# Patient Record
Sex: Female | Born: 1950
Health system: Southern US, Community
[De-identification: ages and names within clinical notes are randomized; demographics above are authoritative.]

## PROBLEM LIST (undated history)

## (undated) DIAGNOSIS — K5900 Constipation, unspecified: Secondary | ICD-10-CM

## (undated) DIAGNOSIS — T4145XA Adverse effect of unspecified anesthetic, initial encounter: Secondary | ICD-10-CM

## (undated) DIAGNOSIS — D649 Anemia, unspecified: Secondary | ICD-10-CM

## (undated) DIAGNOSIS — I201 Angina pectoris with documented spasm: Secondary | ICD-10-CM

## (undated) DIAGNOSIS — R51 Headache: Secondary | ICD-10-CM

## (undated) DIAGNOSIS — M1990 Unspecified osteoarthritis, unspecified site: Secondary | ICD-10-CM

## (undated) DIAGNOSIS — H9190 Unspecified hearing loss, unspecified ear: Secondary | ICD-10-CM

## (undated) DIAGNOSIS — T7840XA Allergy, unspecified, initial encounter: Secondary | ICD-10-CM

## (undated) DIAGNOSIS — K219 Gastro-esophageal reflux disease without esophagitis: Secondary | ICD-10-CM

## (undated) DIAGNOSIS — Z5189 Encounter for other specified aftercare: Secondary | ICD-10-CM

## (undated) DIAGNOSIS — I10 Essential (primary) hypertension: Secondary | ICD-10-CM

## (undated) DIAGNOSIS — T8859XA Other complications of anesthesia, initial encounter: Secondary | ICD-10-CM

## (undated) DIAGNOSIS — M8580 Other specified disorders of bone density and structure, unspecified site: Secondary | ICD-10-CM

## (undated) DIAGNOSIS — IMO0001 Reserved for inherently not codable concepts without codable children: Secondary | ICD-10-CM

## (undated) DIAGNOSIS — Z889 Allergy status to unspecified drugs, medicaments and biological substances status: Secondary | ICD-10-CM

## (undated) DIAGNOSIS — H269 Unspecified cataract: Secondary | ICD-10-CM

## (undated) DIAGNOSIS — H509 Unspecified strabismus: Secondary | ICD-10-CM

## (undated) HISTORY — DX: Encounter for other specified aftercare: Z51.89

## (undated) HISTORY — PX: COLONOSCOPY: SHX174

## (undated) HISTORY — PX: STOMACH SURGERY: SHX791

## (undated) HISTORY — DX: Constipation, unspecified: K59.00

## (undated) HISTORY — DX: Unspecified strabismus: H50.9

## (undated) HISTORY — DX: Allergy, unspecified, initial encounter: T78.40XA

## (undated) HISTORY — DX: Gastro-esophageal reflux disease without esophagitis: K21.9

## (undated) HISTORY — PX: APPENDECTOMY: SHX54

## (undated) HISTORY — PX: UPPER GASTROINTESTINAL ENDOSCOPY: SHX188

## (undated) HISTORY — PX: ABDOMINAL HYSTERECTOMY: SHX81

## (undated) HISTORY — DX: Essential (primary) hypertension: I10

## (undated) HISTORY — PX: OTHER SURGICAL HISTORY: SHX169

## (undated) HISTORY — PX: TONSILLECTOMY: SUR1361

## (undated) HISTORY — DX: Anemia, unspecified: D64.9

## (undated) HISTORY — DX: Unspecified cataract: H26.9

## (undated) HISTORY — PX: POLYPECTOMY: SHX149

---
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
## 1988-12-04 DIAGNOSIS — I201 Angina pectoris with documented spasm: Secondary | ICD-10-CM

## 1988-12-04 HISTORY — DX: Angina pectoris with documented spasm: I20.1

## 1992-04-05 HISTORY — PX: REPAIR TENDONS FOOT: SUR1209

## 1998-10-06 ENCOUNTER — Emergency Department (HOSPITAL_COMMUNITY): Admission: EM | Admit: 1998-10-06 | Discharge: 1998-10-06 | Payer: Self-pay | Admitting: Internal Medicine

## 1999-09-23 ENCOUNTER — Other Ambulatory Visit: Admission: RE | Admit: 1999-09-23 | Discharge: 1999-09-23 | Payer: Self-pay | Admitting: Obstetrics & Gynecology

## 2000-04-13 ENCOUNTER — Encounter: Payer: Self-pay | Admitting: Emergency Medicine

## 2000-04-14 ENCOUNTER — Encounter: Payer: Self-pay | Admitting: Emergency Medicine

## 2000-04-14 ENCOUNTER — Inpatient Hospital Stay (HOSPITAL_COMMUNITY): Admission: EM | Admit: 2000-04-14 | Discharge: 2000-04-16 | Payer: Self-pay | Admitting: Emergency Medicine

## 2000-04-14 ENCOUNTER — Encounter: Payer: Self-pay | Admitting: Internal Medicine

## 2000-04-15 ENCOUNTER — Encounter: Payer: Self-pay | Admitting: Internal Medicine

## 2000-04-29 ENCOUNTER — Ambulatory Visit (HOSPITAL_COMMUNITY): Admission: RE | Admit: 2000-04-29 | Discharge: 2000-04-30 | Payer: Self-pay | Admitting: Cardiology

## 2001-10-30 ENCOUNTER — Encounter: Payer: Self-pay | Admitting: Orthopedic Surgery

## 2001-10-30 ENCOUNTER — Ambulatory Visit (HOSPITAL_COMMUNITY): Admission: RE | Admit: 2001-10-30 | Discharge: 2001-10-30 | Payer: Self-pay | Admitting: Orthopedic Surgery

## 2004-05-06 ENCOUNTER — Other Ambulatory Visit: Admission: RE | Admit: 2004-05-06 | Discharge: 2004-05-06 | Payer: Self-pay | Admitting: Obstetrics & Gynecology

## 2004-07-07 ENCOUNTER — Ambulatory Visit: Payer: Self-pay | Admitting: Internal Medicine

## 2004-08-11 ENCOUNTER — Ambulatory Visit: Payer: Self-pay | Admitting: Internal Medicine

## 2004-08-11 LAB — HM COLONOSCOPY: HM Colonoscopy: 10

## 2004-10-01 ENCOUNTER — Ambulatory Visit: Payer: Self-pay | Admitting: Internal Medicine

## 2007-02-21 ENCOUNTER — Ambulatory Visit: Payer: Self-pay | Admitting: Cardiology

## 2007-03-08 ENCOUNTER — Ambulatory Visit: Payer: Self-pay | Admitting: Cardiology

## 2007-03-08 LAB — CONVERTED CEMR LAB
ALT: 24 units/L (ref 0–35)
Albumin: 4 g/dL (ref 3.5–5.2)
Alkaline Phosphatase: 80 units/L (ref 39–117)
BUN: 10 mg/dL (ref 6–23)
Basophils Relative: 0 % (ref 0.0–1.0)
Calcium: 9.2 mg/dL (ref 8.4–10.5)
Cholesterol: 230 mg/dL (ref 0–200)
Eosinophils Absolute: 0.1 10*3/uL (ref 0.0–0.6)
GFR calc Af Amer: 96 mL/min
GFR calc non Af Amer: 79 mL/min
HDL: 47.9 mg/dL (ref >39.0)
Lymphocytes Relative: 37.8 % (ref 12.0–46.0)
MCV: 92 fL (ref 78.0–100.0)
Monocytes Relative: 8.2 % (ref 3.0–11.0)
Neutro Abs: 3.9 10*3/uL (ref 1.4–7.7)
Platelets: 288 10*3/uL (ref 150–400)
Triglycerides: 197 mg/dL — ABNORMAL HIGH (ref 0–149)
VLDL: 39 mg/dL (ref 0–40)

## 2007-06-08 ENCOUNTER — Emergency Department (HOSPITAL_COMMUNITY): Admission: EM | Admit: 2007-06-08 | Discharge: 2007-06-08 | Payer: Self-pay | Admitting: Family Medicine

## 2007-06-16 ENCOUNTER — Encounter: Payer: Self-pay | Admitting: Family Medicine

## 2007-11-22 ENCOUNTER — Encounter: Payer: Self-pay | Admitting: Family Medicine

## 2008-02-19 ENCOUNTER — Encounter: Payer: Self-pay | Admitting: Family Medicine

## 2009-10-16 ENCOUNTER — Encounter: Payer: Self-pay | Admitting: Family Medicine

## 2009-10-17 ENCOUNTER — Other Ambulatory Visit: Admission: RE | Admit: 2009-10-17 | Discharge: 2009-10-17 | Payer: Self-pay | Admitting: Family Medicine

## 2009-10-17 ENCOUNTER — Ambulatory Visit: Payer: Self-pay | Admitting: Family Medicine

## 2009-10-17 DIAGNOSIS — M949 Disorder of cartilage, unspecified: Secondary | ICD-10-CM

## 2009-10-17 DIAGNOSIS — E559 Vitamin D deficiency, unspecified: Secondary | ICD-10-CM | POA: Insufficient documentation

## 2009-10-17 DIAGNOSIS — I209 Angina pectoris, unspecified: Secondary | ICD-10-CM | POA: Insufficient documentation

## 2009-10-17 DIAGNOSIS — M899 Disorder of bone, unspecified: Secondary | ICD-10-CM | POA: Insufficient documentation

## 2009-10-17 LAB — CONVERTED CEMR LAB: Vit D, 25-Hydroxy: 33 ng/mL (ref 30–89)

## 2009-10-21 LAB — CONVERTED CEMR LAB
Bilirubin, Direct: 0.1 mg/dL (ref 0.0–0.3)
CO2: 30 meq/L (ref 19–32)
Calcium: 9.1 mg/dL (ref 8.4–10.5)
Cholesterol: 232 mg/dL — ABNORMAL HIGH (ref 0–200)
Creatinine, Ser: 0.7 mg/dL (ref 0.4–1.2)
Total Bilirubin: 0.3 mg/dL (ref 0.3–1.2)
Total CHOL/HDL Ratio: 4
Total Protein: 7.1 g/dL (ref 6.0–8.3)
Triglycerides: 136 mg/dL (ref 0.0–149.0)
VLDL: 27.2 mg/dL (ref 0.0–40.0)

## 2009-10-27 ENCOUNTER — Encounter: Payer: Self-pay | Admitting: Family Medicine

## 2009-10-27 LAB — CONVERTED CEMR LAB: Pap Smear: NEGATIVE

## 2009-10-28 ENCOUNTER — Encounter: Payer: Self-pay | Admitting: Family Medicine

## 2009-10-29 ENCOUNTER — Ambulatory Visit: Payer: Self-pay | Admitting: Internal Medicine

## 2009-10-29 ENCOUNTER — Encounter: Payer: Self-pay | Admitting: Family Medicine

## 2009-11-12 ENCOUNTER — Ambulatory Visit: Payer: Self-pay | Admitting: Family Medicine

## 2009-11-24 ENCOUNTER — Telehealth: Payer: Self-pay | Admitting: Family Medicine

## 2009-11-26 ENCOUNTER — Ambulatory Visit: Payer: Self-pay | Admitting: Family Medicine

## 2009-12-24 ENCOUNTER — Ambulatory Visit: Payer: Self-pay | Admitting: Family Medicine

## 2009-12-24 DIAGNOSIS — B379 Candidiasis, unspecified: Secondary | ICD-10-CM | POA: Insufficient documentation

## 2010-01-21 ENCOUNTER — Ambulatory Visit: Payer: Self-pay | Admitting: Family Medicine

## 2010-01-22 ENCOUNTER — Encounter: Payer: Self-pay | Admitting: Family Medicine

## 2010-01-22 LAB — CONVERTED CEMR LAB: HDL: 52.7 mg/dL (ref >39.00)

## 2010-04-20 ENCOUNTER — Ambulatory Visit
Admission: RE | Admit: 2010-04-20 | Discharge: 2010-04-20 | Payer: Self-pay | Source: Home / Self Care | Attending: Family Medicine | Admitting: Family Medicine

## 2010-04-20 DIAGNOSIS — J069 Acute upper respiratory infection, unspecified: Secondary | ICD-10-CM | POA: Insufficient documentation

## 2010-05-05 NOTE — Assessment & Plan Note (Signed)
Summary: Prolia injection/nt   Vital Signs:  Patient profile:   60 year old female Height:      58.5 inches Weight:      196 pounds BMI:     40.41 Temp:     98.0 degrees F oral Pulse rate:   68 / minute Pulse rhythm:   regular BP sitting:   122 / 82  (right arm) Cuff size:   large  Vitals Entered By: Linde Gillis CMA Duncan Dull) (November 26, 2009 8:54 AM) CC: Prolia injection   History of Present Illness: 60 yo here for Prolia injection.  1. Osteopenia- last DEXA was in 2009.  H/o two vertebral fractures and foot fractures.  Taking Caltrate (1200 mg of calcium). DEXA last month showed worsening of her osteopenia- T -1.5 in lumbar spine, -1.3 in hips bilaterally.  VIt D levels now normal.  We agreed to start Prolia given her multiple fractures in past and worsening osteopenia.   Current Medications (verified): 1)  Calcium 500 Mg Tabs (Calcium) .... Take One Tablet By Mouth Daily 2)  Colace 100 Mg Caps (Docusate Sodium) .... Take One To Three Tablets By Mouth Daily 3)  Aspirin Low Dose 81 Mg Tabs (Aspirin) .... Take One Tablet By Mouth Daily 4)  Multivitamins  Tabs (Multiple Vitamin) .... Take One Tablet By Mouth Daily 5)  Aleve 220 Mg Tabs (Naproxen Sodium) .... Take 440mg  To 880mg  As Needed 6)  Advil 200 Mg Tabs (Ibuprofen) .... Take As Needed  Allergies (verified): No Known Drug Allergies  Past History:  Past Medical History: Last updated: November 09, 2009 h/o coronary vasospasm (Dr. 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  Physical Exam  General:  alert and overweight-appearing.   Psych:  Cognition and judgment appear intact. Alert and cooperative with normal attention span and concentration. No apparent delusions, illusions, hallucinations   Impression & Recommendations:  Problem # 1:  OSTEOPENIA (ICD-733.90) Assessment New Given prolia injection in office today. Tolerated procedure well. Her updated medication list for this problem includes:    Calcium 500 Mg Tabs (Calcium) .Marland Kitchen... Take one tablet by mouth daily  Orders: Prolia 60mg  (J3590) Admin of Therapeutic Inj  intramuscular or subcutaneous (04540)  Complete Medication List: 1)  Calcium 500 Mg Tabs (Calcium) .... Take one tablet by mouth daily 2)  Colace 100 Mg Caps (Docusate sodium) .... Take one to three tablets by mouth daily 3)  Aspirin Low Dose 81 Mg Tabs (Aspirin) .... Take one tablet by mouth daily 4)  Multivitamins Tabs (Multiple vitamin) .... Take one tablet by mouth daily 5)  Aleve 220 Mg Tabs (Naproxen sodium) .... Take 440mg  to 880mg  as needed 6)  Advil 200 Mg Tabs (Ibuprofen) .... Take as needed  Current Allergies (reviewed today): No known allergies    Procedure Note Last Tetanus: historical (10/23/2008)  Injections: The patient denies pain, redness, irritation, inflammation, tenderness, and swelling. Date of onset: 11/19/2009 Indication: treatment  Procedure #  1: injection    Region: left deltoid    Technique: 18 g needle    Medication: Prolia 60 mg    Anesthesia: none  Cleaned and prepped with: alcohol   Medication Administration  Injection # 1:    Medication: Prolia 60mg     Diagnosis: OSTEOPENIA (ICD-733.90)    Route: SQ    Site: L deltoid    Exp Date: 03/06/2011    Lot #: [id number]    Mfr: Amgen    Patient tolerated  injection without complications    Given by: Linde Gillis CMA Duncan Dull) (November 26, 2009 9:06 AM)  Orders Added: 1)  Prolia 60mg  [J3590] 2)  Admin of Therapeutic Inj  intramuscular or subcutaneous [96372] 3)  Est. Patient Level III [56213]

## 2010-05-05 NOTE — Miscellaneous (Signed)
Summary: BONE DENSITY  Clinical Lists Changes  Orders: Added new Test order of T-Bone Densitometry (77080) - Signed Added new Test order of T-Lumbar Vertebral Assessment (77082) - Signed 

## 2010-05-05 NOTE — Assessment & Plan Note (Signed)
Summary: NEW PT TO EST/CLE   Vital Signs:  Patient profile:   60 year old female Height:      58.5 inches Weight:      195 pounds BMI:     40.21 Temp:     98.5 degrees F oral Pulse rate:   72 / minute Pulse rhythm:   regular BP sitting:   130 / 90  (right arm) Cuff size:   large  Vitals Entered By: Linde Gillis CMA Duncan Dull) (11-02-2009 10:47 AM) CC: new patient, establish care   History of Present Illness: 60 yo here to establish care with no complaints.  1.  Vitamin D deficiency- last checked in in 11/2007, was 28.  s/p high dose replacement.  Denies any fatigue or joint pain.  2.  h/o coronary vasospams- followed by Dr. Myrtis Ser.  Has not had an EKG in two years.  Typically occurs at rest, has multiple short episodes per month.  No exertional CP or SOB.  No LE.  Was on Norvasc but was stopped because she was doing so well.  3.  osteopenia- last DEXA was in 2009.  H/o two vertebral fractures and foot fractures.  Taking Caltrate.  Well woman- s/p hyesterecomy for fibroids- still has cervix and ovaries. UTD colonoscopy, tetanus.  Preventive Screening-Counseling & Management  Alcohol-Tobacco     Smoking Status: never  Caffeine-Diet-Exercise     Does Patient Exercise: yes      Drug Use:  no.    Current Medications (verified): 1)  Calcium 500 Mg Tabs (Calcium) .... Take One Tablet By Mouth Daily 2)  Colace 100 Mg Caps (Docusate Sodium) .... Take One To Three Tablets By Mouth Daily 3)  Aspirin Low Dose 81 Mg Tabs (Aspirin) .... Take One Tablet By Mouth Daily 4)  Multivitamins  Tabs (Multiple Vitamin) .... Take One Tablet By Mouth Daily 5)  Aleve 220 Mg Tabs (Naproxen Sodium) .... Take 440mg  To 880mg  As Needed 6)  Advil 200 Mg Tabs (Ibuprofen) .... 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 Eyes:  Denies blurring. ENT:  Denies difficulty swallowing. CV:  Denies chest pain or discomfort. Resp:  Denies shortness of breath. GI:  Denies abdominal pain and change in bowel habits. GU:  Denies abnormal vaginal bleeding and discharge. MS:  Denies joint pain, joint redness, and joint swelling. Derm:  Denies rash. Neuro:  Denies headaches. Psych:  Denies anxiety and depression. Endo:  Denies cold intolerance and heat intolerance. Heme:  Denies abnormal bruising  and bleeding.  Physical Exam  General:  alert and overweight-appearing.   Head:  normocephalic.   Eyes:  pupils equal, pupils round, pupils reactive to light, and strabismus.   Ears:  R ear normal and L ear normal.   Nose:  no external deformity.   Mouth:  good dentition.   Neck:  No deformities, masses, or tenderness noted. Breasts:  No mass, nodules, thickening, tenderness, bulging, retraction, inflamation, nipple discharge or skin changes noted.   Lungs:  Normal respiratory effort, chest expands symmetrically. Lungs are clear to auscultation, no crackles or  wheezes. Heart:  Normal rate and regular rhythm. S1 and S2 normal without gallop, murmur, click, rub or other extra sounds. Abdomen:  Bowel sounds positive,abdomen soft and non-tender without masses, organomegaly or hernias noted. Genitalia:  Pelvic Exam:        External: normal female genitalia without lesions or masses        Vagina: normal without lesions or masses        Cervix: normal without lesions or masses        Adnexa: normal bimanual exam without masses or fullness        Uterus: absent        Pap smear: performed Msk:  No deformity or scoliosis noted of thoracic or lumbar spine.   Extremities:  no edema Neurologic:  alert & oriented X3 and gait normal.   Skin:  Intact without suspicious lesions or rashes Psych:  Cognition and judgment appear intact. Alert and cooperative with normal attention span and concentration. No apparent delusions, illusions, hallucinations   Impression & Recommendations:  Problem # 1:  Preventive Health Care (ICD-V70.0) Reviewed preventive care protocols, scheduled due services, and updated immunizations Discussed nutrition, exercise, diet, and healthy lifestyle.  Pap today. FLP, BMET today. Set up mammogram today. Orders: Venipuncture (11914) TLB-BMP (Basic Metabolic Panel-BMET) (80048-METABOL)  Problem # 2:  OSTEOPENIA (ICD-733.90) Assessment: Unchanged Set up DEXA scan today. Her updated medication list for this problem includes:    Calcium 500 Mg Tabs (Calcium) .Marland Kitchen... Take one tablet by mouth daily  Orders: Radiology Referral (Radiology)  Problem # 3:  UNSPECIFIED VITAMIN D DEFICIENCY (ICD-268.9) Assessment: Unchanged recheck Vit D today. Orders: Venipuncture (78295) T-Vitamin D (25-Hydroxy) (62130-86578)  Problem # 4:  Hx of CORONARY ARTERY SPASM (ICD-413.9) Assessment: Unchanged EKG showed NSR and asymptomatic.  Continue to follow, ASA 81 mg daily. Her updated medication list for this problem includes:    Aspirin Low Dose 81  Mg Tabs (Aspirin) .Marland Kitchen... Take one tablet by mouth daily  Orders: EKG w/ Interpretation (93000)  Complete Medication List: 1)  Calcium 500 Mg Tabs (Calcium) .... Take one tablet by mouth daily 2)  Colace 100 Mg Caps (Docusate sodium) .... Take one to three tablets by mouth daily 3)  Aspirin Low Dose 81 Mg Tabs (Aspirin) .... Take one tablet by mouth daily 4)  Multivitamins Tabs (Multiple vitamin) .... Take one tablet by mouth daily 5)  Aleve 220 Mg Tabs (Naproxen sodium) .... Take 440mg  to 880mg  as needed 6)  Advil 200 Mg Tabs (Ibuprofen) .... Take as needed  Other Orders: TLB-Lipid Panel (80061-LIPID) TLB-Hepatic/Liver Function Pnl (80076-HEPATIC)  Patient Instructions: 1)  It was a pleasure to meet you, Ms. Mcquitty. 2)  We will call you with your lab results on Monday. 3)  Have a wonderful weekend. 4)  Please stop by to see Shirlee Limerick on your way out.  Current Allergies (reviewed today): No known allergies   TD Result Date:  10/23/2008 TD Result:  historical Colonoscopy Result Date:  10/27/2004 Colonoscopy Result:  historical   Prevention & Chronic Care Immunizations   Influenza vaccine: Not documented    Tetanus booster: 10/23/2008: historical   Tetanus booster due: 10/24/2018    Pneumococcal vaccine: Not documented  Colorectal Screening   Hemoccult: Not documented    Colonoscopy: historical  (10/27/2004)   Colonoscopy due: 10/28/2014  Other Screening   Pap smear: Not documented   Pap smear action/deferral: Ordered  (10/17/2009)    Mammogram: Not documented   Mammogram action/deferral: Ordered  (10/17/2009)   Smoking status: never  (10/17/2009)  Lipids   Total Cholesterol: 230  (03/08/2007)   LDL: DEL  (03/08/2007)   LDL Direct: 143.9  (03/08/2007)   HDL: 47.9  (03/08/2007)   Triglycerides: 197  (03/08/2007)   Nursing Instructions: Pap smear today Schedule screening mammogram (see order)   Appended Document: NEW PT TO EST/CLE

## 2010-05-05 NOTE — Progress Notes (Signed)
Summary: Prolia injection  Phone Note Outgoing Call Call back at [phone]   Call placed by: Linde Gillis CMA Duncan Dull),  November 24, 2009 2:55 PM Call placed to: Insurer Summary of Call: I have called [employer] [place] several times today trying to see if patients insurance will cover the Prolia injection.  I have not been able to get through either time I've called, I go through all the prompts only to be left holding the line for more than 10 minutes at a time.  I called patient and left a message on cell phone voicemail advising her that she will need to call and verify if the Prolia injection will be covered by her insurace as I do not have 10-15 minutes to be on hold with the insurance company.   Initial call taken by: Linde Gillis CMA Duncan Dull),  November 24, 2009 2:58 PM  Follow-up for Phone Call        I agree.  thank you. Ruthe Mannan MD  November 24, 2009 3:03 PM  Follow-up by: Linde Gillis CMA Duncan Dull),  November 24, 2009 3:05 PM

## 2010-05-05 NOTE — Assessment & Plan Note (Signed)
Summary: RASH   Vital Signs:  Patient profile:   60 year old female Height:      58.5 inches Weight:      188 pounds BMI:     38.76 Temp:     98.3 degrees F oral Pulse rate:   72 / minute Pulse rhythm:   regular BP sitting:   122 / 80  (left arm) Cuff size:   large  Vitals Entered By: Linde Gillis CMA Duncan Dull) (December 24, 2009 8:16 AM) CC: rash under breast in and groin area   History of Present Illness: 60 yo here for rash under both breasts and groin x 1 week. Had prolia injection last month and concerned this is a side effect of Prolia. Never had anything like this before. Rash is very "raw" and itchy. CBG was 81 in 10/2009.  Has lost 16 pounds since last office visit!  Not taking any supplements, joined [employer] which provides low gylcemic index prepared meals.    Current Medications (verified): 1)  Calcium 500 Mg Tabs (Calcium) .... Take One Tablet By Mouth Daily 2)  Colace 100 Mg Caps (Docusate Sodium) .... Take One To Three Tablets By Mouth Daily 3)  Aspirin Low Dose 81 Mg Tabs (Aspirin) .... Take One Tablet By Mouth Daily 4)  Multivitamins  Tabs (Multiple Vitamin) .... Take One Tablet By Mouth Daily 5)  Aleve 220 Mg Tabs (Naproxen Sodium) .... Take 440mg  To 880mg  As Needed 6)  Advil 200 Mg Tabs (Ibuprofen) .... Take As Needed 7)  Prolia 60 Mg/ml Soln (Denosumab) .... One Injection Subcutaneously Every Six Months 8)  Nystatin 100000 Unit/gm Powd (Nystatin) .... Apply 2-3 Times Daily To Affected Area  Allergies (verified): No Known Drug Allergies  Past History:  Past Medical History: Last updated: 11-12-09 h/o coronary vasospasm (Dr. 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 Resp:  Denies cough and wheezing.  Physical Exam  General:  alert and overweight-appearing.   Mouth:  good dentition.   Skin:  erythematous macular rash with satellite lesions under breasts bilaterally and bilateral inguinal area. Psych:  Cognition and judgment appear intact. Alert and cooperative with normal attention span and concentration. No apparent delusions, illusions, hallucinations   Impression & Recommendations:  Problem # 1:  CANDIDIASIS OF UNSPECIFIED SITE (ICD-112.9) Assessment New Unclear if this is related to Prolia.  Possible side effects include skin breakdown and skin infections.  Will treat with topical nystatin powder, follow up next week if no improvement of symptoms.  Complete Medication List: 1)  Calcium 500 Mg Tabs (Calcium) .... Take one tablet by mouth daily 2)  Colace 100 Mg Caps (Docusate sodium) .... Take one to three tablets by mouth daily 3)  Aspirin Low Dose 81 Mg Tabs (Aspirin) .... Take one tablet by mouth daily 4)  Multivitamins Tabs (Multiple vitamin) .... Take one tablet by mouth daily 5)  Aleve 220 Mg Tabs (Naproxen sodium) .... Take 440mg  to 880mg   as needed 6)  Advil 200 Mg Tabs (Ibuprofen) .... Take as needed 7)  Prolia 60 Mg/ml Soln (Denosumab) .... One injection subcutaneously every six months 8)  Nystatin 100000 Unit/gm Powd (Nystatin) .... Apply 2-3 times daily to affected area Prescriptions: NYSTATIN 100000 UNIT/GM POWD (NYSTATIN) Apply 2-3 times daily to affected area  #30 g x 0   Entered and Authorized by:   Ruthe Mannan MD   Signed by:   Ruthe Mannan MD on 12/24/2009   Method used:   Electronically to         [facility] (retail)       [address]       [place], [place]  [address]       Ph: [phone] or [phone]       Fax: [phone]   RxID:   [id number]   Current Allergies (reviewed today): No known allergies

## 2010-05-05 NOTE — Assessment & Plan Note (Signed)
Summary: DISCUSS BONE DENSITY/NT   Vital Signs:  Patient profile:   60 year old female Height:      58.5 inches Weight:      194.25 pounds BMI:     40.05 Temp:     98.0 degrees F oral Pulse rate:   76 / minute Pulse rhythm:   regular BP sitting:   92 / 70  (right arm) Cuff size:   large  Vitals Entered By: Linde Gillis CMA Duncan Dull) (November 12, 2009 8:27 AM) CC: discuss bone density   History of Present Illness: 60 yo here to discuss bone density results.  1. Osteopenia- last DEXA was in 2009.  H/o two vertebral fractures and foot fractures.  Taking Caltrate (1200 mg of calcium). DEXA this month shows worsening of her osteopenia- T -1.5 in lumbar spine, -1.3 in hips bilaterally.  VIt D levels now normal.     Current Medications (verified): 1)  Calcium 500 Mg Tabs (Calcium) .... Take One Tablet By Mouth Daily 2)  Colace 100 Mg Caps (Docusate Sodium) .... Take One To Three Tablets By Mouth Daily 3)  Aspirin Low Dose 81 Mg Tabs (Aspirin) .... Take One Tablet By Mouth Daily 4)  Multivitamins  Tabs (Multiple Vitamin) .... Take One Tablet By Mouth Daily 5)  Aleve 220 Mg Tabs (Naproxen Sodium) .... Take 440mg  To 880mg  As Needed 6)  Advil 200 Mg Tabs (Ibuprofen) .... Take As Needed  Allergies (verified): No Known Drug Allergies  Past History:  Past Medical History: Last updated: 02-Nov-2009 h/o coronary vasospasm (Dr. 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   Psych:  Cognition and judgment appear intact. Alert and cooperative with normal attention span and concentration. No apparent delusions, illusions, hallucinations   Impression & Recommendations:  Problem # 1:  OSTEOPENIA (ICD-733.90) Assessment Deteriorated Time spent with patient 25 minutes, more than 50% of this time was spent counseling patient on osteopenia. Has never taken fosamax but does have h/o GI concerns (reflux and diverticulosis) and will likely not be a good candidate. Also has had vertebral and foot fractures which places her in a class similar to osteoporosis and likely needs something like Prolia.  Will order Prolia and administer in office.  Her updated medication list for this problem includes:    Calcium 500 Mg Tabs (Calcium) .Marland Kitchen... Take one tablet by mouth daily  Complete Medication List: 1)  Calcium 500 Mg Tabs (Calcium) .... Take one tablet by mouth daily 2)  Colace 100 Mg Caps (Docusate sodium) .... Take one to three tablets by mouth daily 3)  Aspirin Low Dose 81 Mg Tabs (Aspirin) .... Take one tablet by mouth daily 4)  Multivitamins Tabs (Multiple vitamin) .... Take one tablet by mouth daily 5)  Aleve 220 Mg Tabs (Naproxen sodium) .... Take 440mg  to 880mg  as needed 6)  Advil 200 Mg Tabs (Ibuprofen) .... Take as needed  Current Allergies (reviewed today): No  known allergies

## 2010-05-05 NOTE — Letter (Signed)
Summary: Generic Letter   at [facility]  [address] [place], [place] [address]   Phone: [phone]  Fax: [phone]    01/22/2010  [facility] [place] [facility] [address] [place], [place]  [address]  Dear Ms. Hulsebus,    We have received your lab results and Dr. Dayton Martes says that your cholesterol look amazing!  Great job and keep up the good work!!      Sincerely,       Linde Gillis CMA (AAMA)for Dr. Ruthe Mannan

## 2010-05-05 NOTE — Letter (Signed)
Summary: [facility]  [facility]   Imported By: Lanelle Bal 12/10/2009 13:08:42  _____________________________________________________________________  External Attachment:    Type:   Image     Comment:   External Document

## 2010-05-05 NOTE — Letter (Signed)
Summary: [facility]  [facility]   Imported By: Lanelle Bal 12/10/2009 13:05:23  _____________________________________________________________________  External Attachment:    Type:   Image     Comment:   External Document

## 2010-05-05 NOTE — Letter (Signed)
Summary: Results Follow up Letter  [place] at [facility]  [address] [place], [place] [address]   Phone: [phone]  Fax: [phone]    10/27/2009 MRN: [id number]  [facility] [address] [place], [place]  [address]  Dear Ms. Kaatz,  The following are the results of your recent test(s):  Test         Result    Pap Smear:        Normal __X___  Not Normal _____ Comments: ______________________________________________________ Cholesterol: LDL(Bad cholesterol):         Your goal is less than:         HDL (Good cholesterol):       Your goal is more than: Comments:  ______________________________________________________ Mammogram:        Normal _____  Not Normal _____ Comments:  ___________________________________________________________________ Hemoccult:        Normal _____  Not normal _______ Comments:    _____________________________________________________________________ Other Tests:    We routinely do not discuss normal results over the telephone.  If you desire a copy of the results, or you have any questions about this information we can discuss them at your next office visit.   Sincerely,       Ruthe Mannan, MD

## 2010-05-07 NOTE — Assessment & Plan Note (Signed)
Summary: COUGH OFF AND ON/ lb   Vital Signs:  Patient profile:   60 year old female Weight:      191.50 pounds Temp:     97.8 degrees F oral Pulse rate:   76 / minute Pulse rhythm:   regular BP sitting:   128 / 76  (left arm) Cuff size:   large  Vitals Entered By: Selena Batten Dance CMA (AAMA) (April 20, 2010 8:54 AM) CC: Cough x2 months   History of Present Illness: 61 yo here for cough x 2 months.  Started with URI symptoms, runny nose, dry cough. Symptoms improved but over past several weeks, continue to worsen. Cough now productive. No fever but feels very fatigued. Over the weekend, started having significant frontal sinus pressure. Taking Mucinex DM with some relief of congestion.  Current Medications (verified): 1)  Calcium 500 Mg Tabs (Calcium) .... Take One Tablet By Mouth Daily 2)  Colace 100 Mg Caps (Docusate Sodium) .... Take One To Three Tablets By Mouth Daily 3)  Aspirin Low Dose 81 Mg Tabs (Aspirin) .... Take One Tablet By Mouth Daily 4)  Multivitamins  Tabs (Multiple Vitamin) .... Take One Tablet By Mouth Daily 5)  Aleve 220 Mg Tabs (Naproxen Sodium) .... Take 440mg  To 880mg  As Needed 6)  Advil 200 Mg Tabs (Ibuprofen) .... Take As Needed 7)  Prolia 60 Mg/ml Soln (Denosumab) .... One Injection Subcutaneously Every Six Months 8)  Nystatin 100000 Unit/gm Powd (Nystatin) .... Apply 2-3 Times Daily To Affected Area 9)  Augmentin 875-125 Mg Tabs (Amoxicillin-Pot Clavulanate) .Marland Kitchen.. 1 By Mouth 2 Times Daily X 10 Days  Allergies (verified): No Known Drug Allergies  Past History:  Past Medical History: Last updated: 09-Nov-2009 h/o coronary vasospasm (Dr. 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 Married Never Smoked Alcohol use-no Drug use-no Regular exercise-yes 2 adult children- boys  Risk Factors: Exercise: yes (Nov 09, 2009)  Risk Factors: Smoking Status: never (2009-11-09)  Review of Systems      See HPI General:  Complains of malaise; denies fever. ENT:  Complains of nasal congestion, postnasal drainage, sinus pressure, and sore throat. Resp:  Complains of cough, sputum productive, and wheezing; denies shortness of breath.  Physical Exam  General:  alert and overweight-appearing.   Eyes:  pupils equal, pupils round, pupils reactive to light, and strabismus.   Ears:  Left ear- s/p surgical repair- thick fluid behind TM right TM-retracted Nose:  boggy turbinates, sinuses TTP throughout Mouth:  pharyngeal erythema.   Lungs:  scattered exp wheezes, left>right no crackes, no increased WOB Heart:  Normal rate and regular rhythm. S1 and S2 normal without gallop, murmur, click, rub or other extra sounds. Extremities:  no edema Neurologic:  alert & oriented X3 and gait normal.   Psych:  Cognition and judgment appear intact. Alert and cooperative with normal attention span and concentration. No apparent delusions, illusions, hallucinations   Impression & Recommendations:  Problem # 1:  URI (ICD-465.9) Assessment New Given duration and progression of symptoms, will treat with Augmentin. See pt instructions for details. Her updated medication list for this problem includes:    Aspirin Low Dose 81 Mg Tabs (Aspirin) .Marland Kitchen... Take one tablet by mouth  daily    Aleve 220 Mg Tabs (Naproxen sodium) .Marland Kitchen... Take 440mg  to 880mg  as needed    Advil 200 Mg Tabs (Ibuprofen) .Marland Kitchen... Take as needed  Complete Medication List: 1)  Calcium 500 Mg Tabs (Calcium) .... Take one tablet by mouth daily 2)  Colace 100 Mg Caps (Docusate sodium) .... Take one to three tablets by mouth daily 3)  Aspirin Low Dose 81  Mg Tabs (Aspirin) .... Take one tablet by mouth daily 4)  Multivitamins Tabs (Multiple vitamin) .... Take one tablet by mouth daily 5)  Aleve 220 Mg Tabs (Naproxen sodium) .... Take 440mg  to 880mg  as needed 6)  Advil 200 Mg Tabs (Ibuprofen) .... Take as needed 7)  Prolia 60 Mg/ml Soln (Denosumab) .... One injection subcutaneously every six months 8)  Nystatin 100000 Unit/gm Powd (Nystatin) .... Apply 2-3 times daily to affected area 9)  Augmentin 875-125 Mg Tabs (Amoxicillin-pot clavulanate) .Marland Kitchen.. 1 by mouth 2 times daily x 10 days  Patient Instructions: 1)  Take antibiotic as directed.  Drink lots of fluids.  Treat sympotmatically with Mucinex, nasal saline irrigation, and Tylenol/Ibuprofen. Also try claritin D or zyrtec D over the counter- two times a day as needed ( have to sign for them at pharmacy). You can use warm compresses.  Cough suppressant at night. Call if not improving as expected in 5-7 days.  Prescriptions: AUGMENTIN 875-125 MG TABS (AMOXICILLIN-POT CLAVULANATE) 1 by mouth 2 times daily x 10 days  #20 x 0   Entered and Authorized by:   Ruthe Mannan MD   Signed by:   Ruthe Mannan MD on 04/20/2010   Method used:   Elect[facility] to        [facility]  [address] [id number]retail[facility]       [address]       [place], [place]  [address]       Ph: [phone] or [phone]       Fax: [phone]   RxID:   [id number]    Orders Added: 1)  Est. Patient Level III [36644]    Current Allergies (reviewed today): No known allergies

## 2010-06-25 ENCOUNTER — Encounter: Payer: Self-pay | Admitting: Family Medicine

## 2010-06-25 LAB — HM MAMMOGRAPHY

## 2010-06-25 LAB — HM COLONOSCOPY

## 2010-06-30 ENCOUNTER — Ambulatory Visit (INDEPENDENT_AMBULATORY_CARE_PROVIDER_SITE_OTHER): Payer: BC Managed Care – PPO | Admitting: Family Medicine

## 2010-06-30 ENCOUNTER — Encounter: Payer: Self-pay | Admitting: Family Medicine

## 2010-06-30 VITALS — BP 122/80 | HR 93 | Temp 98.1°F | Ht <= 58 in | Wt 184.8 lb

## 2010-06-30 DIAGNOSIS — M899 Disorder of bone, unspecified: Secondary | ICD-10-CM

## 2010-06-30 DIAGNOSIS — M810 Age-related osteoporosis without current pathological fracture: Secondary | ICD-10-CM

## 2010-06-30 DIAGNOSIS — M949 Disorder of cartilage, unspecified: Secondary | ICD-10-CM

## 2010-06-30 MED ORDER — DENOSUMAB 60 MG/ML ~~LOC~~ SOLN
60.0000 mg | SUBCUTANEOUS | Status: AC
  Administered 2010-06-30: 60 mg via SUBCUTANEOUS

## 2010-06-30 NOTE — Assessment & Plan Note (Signed)
>  15 min spent with patient, at least half of which was spent on counseling on osteopenia and her treatment plan. Give second prolia injection today.  Follow up DEXA in July 2012. The patient indicates understanding of these issues and agrees with the plan.

## 2010-06-30 NOTE — Progress Notes (Signed)
  Subjective:    Patient ID: [name] [name], female    DOB: 1951-01-22, 60 y.o.   MRN: [id number]  HPI 60 yo here for Prolia injection.  1. Osteopenia- last DEXA was in July 2011. T -1.5 in lumbar spine, -1.3 in hips bilaterally H/o two vertebral fractures and foot fractures.  Taking Caltrate (1200 mg of calcium).  VIt D levels now normal.  We agreed to start Prolia given her multiple fractures in past and worsening osteopenia. Last injection 6 months ago.  Only complaint skin rash she developed one month later, likely unrelated.  Still working on her weight.  Continues to loose weight at a slow pace.  Exercising three times per week.    Wt Readings from Last 3 Encounters:  06/30/10 184 lb 12.8 oz (83.825 kg)  04/20/10 191 lb 8 oz (86.864 kg)  12/24/09 188 lb (85.276 kg)      Review of Systems  Constitutional: Negative for fever, activity change, fatigue and unexpected weight change.  Musculoskeletal: Negative for arthralgias.  Neurological: Negative for headaches.  Hematological: Does not bruise/bleed easily.       Objective:   Physical Exam BP 122/80  Pulse 93  Temp(Src) 98.1 F (36.7 C) (Oral)  Ht 4\' 10"  (1.473 m)  Wt 184 lb 12.8 oz (83.825 kg)  BMI 38.62 kg/m2  General:  alert and overweight-appearing.   Psych:  Cognition and judgment appear intact. Alert and cooperative with normal attention span and concentration. No apparent delusions, illusions, hallucinations       Assessment & Plan:

## 2010-08-18 NOTE — Assessment & Plan Note (Signed)
[facility]                            CARDIOLOGY OFFICE NOTE   NAME:[name], [name]                      MRN:          [id number]  DATE:02/21/2007                            DOB:          1950/05/30    [name] [name] is here for cardiology followup.  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  I have seen her in the past.  Her last visit was in July of 2007.  She had had some chest pain in the  past and there was question that she might have spasm.  She took Norvasc  for a while an then her Norvasc was stopped.  We know that she has  normal LV function and normal coronary arteries.  While in the hospital  in 2002, she did have eight beats of ventricular tachycardia with a rate  of 160.  There was ostial spasm of the right coronary artery with the  initial injection.  This disappeared and the vessels were widely patent.  She has some intermittent, very brief chest discomfort over time, but  this is chronic and does not appear to be a significant issue.  Recently, this past summer, her weight had increased to her highest  level ever and she became quite concerned with her lack of exercise  tolerance.  Since then, she has lost ten pounds and her weight is  decreasing, although slowly.  She is very committed to her exercise  program and her Weight Watchers program and I congratulated her for her  efforts.  They will, of course, need to continue over the long term.  She is not having any marked shortness of breath now.  She has not had  any syncope or presyncope.   PAST MEDICAL HISTORY:   ALLERGIES:  Include PERCODAN and CORTISPORIN OTIC.   MEDICATIONS:  She is on no medicines at this point.   REVIEW OF SYSTEMS:  She has some constipation at times and she has some  very slight ankle-swelling at times.  Otherwise, her review of systems  is negative.   PHYSICAL EXAM:  Blood pressure today is 131/89, pulse is 79.  She has  checked her  blood pressure on her own and on a very repeated basis, her  diastolic is not elevated.  She is losing weight and this pressure can  be followed.  She is overweight.  The patient is oriented to person, time and place  and her affect is normal.  Her eyes are not conjugate.  She has no xanthelasma.  NECK:  Reveals no bruits.  There is no jugular venous distention.  LUNGS:  Clear.  Respiratory effort is not labored.  CARDIAC EXAM:  Reveals an S1 and an S2.  There are no clicks or  significant murmurs.  ABDOMEN:  Soft.  There is trivial peripheral edema.  There are no major musculoskeletal deformities.   EKG is normal.   PROBLEMS INCLUDE:  1. History of left-foot reconstruction in the past.  2. Status post hysterectomy.  3. Status post C-sections.  4. Radical mastoidectomy of the left ear in 1956.  5. Some irritable bowel symptoms.  6. Some diverticulosis.  7. Obesity and she is quite concerned and she is taking all of the      appropriate measures for this.  8. History of normal LV function.  9. History of normal coronaries in January of 2002.  10.Eight beats of ventricular tachycardia at a rate of 160 in 2002.      She has no syncope or palpitations.  11.Some ostial spasm of her right coronary with initial injection in      2002.  12.Allergy to Percodan and Cortisporin Otic.  13.History of some mild leg-swelling in the past, but she has had no      deep venous thrombosis in the past.  14.History in the remote past of question of hypothyroidism, but after      Synthroid was started, it was stopped and she did not need it and      this was many, many years ago.   Her overall cardiac status is stable.  There is a family history of  coronary disease.  The patient is to have fasting labs, including a  fasting lipid profile.  This will be arranged and we will be in touch  with her with the results.  I will see her back in one year for  cardiology followup.     [name] Abed,  MD, [facility]  Electronically Signed    JDK/MedQ  DD: 02/21/2007  DT: 02/22/2007  Job #: [id number]   cc:   Gerrit Friends. Aldona Bar, M.D.

## 2010-08-21 NOTE — Cardiovascular Report (Signed)
[place]  Patient:    [name] [name], [name] [name]                    MRN: [id number] Proc. Date: 04/29/00 Adm. Date:  [id number] Disc. Date: [id number] Attending:  Ronaldo Miyamoto CCNolon Nations, M.D., M.P.H.  Luis Abed, M.D. 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  She has had recent chest pain and developed recurrent chest discomfort.  She had a Cardiolite study with no ischemia and a CT scan which did not reveal pulmonary emboli.  She continued to have symptoms and subsequently was referred for coronary angiography.  PROCEDURE: 1. Left heart catheterization. 2. Selective coronary arteriography. 3. Selective left ventriculography. 4. Administration of sublingual nitroglycerin.  CARDIOLOGIST:  Arturo Morton. Riley Kill, M.D. [facility]  DESCRIPTION OF THE PROCEDURE:  The procedure was attempted from the right femora artery, although we had difficulty gaining access.  We used a Smart needle and still were unable to gain access.  Attention was then shifted to the left femoral artery.  There was an anterior puncture which she tolerated well.  A 5-French sheath was placed.  Ventriculography was performed in the RAO projection.  Proximal root aortography was then preformed in the LAO projection to exclude dissection as an etiology of her chest pain.  With the initial right coronary injection, she had some ostial spasm.  We, therefore, administered sublingual nitroglycerin and continued this with fluids, watching her blood pressure during the administration.  We then performed left coronary arteriography using a 5-French JL4 catheter, and there was excellent opacification of this vessel.  We ultimately were able to cannulate the right coronary artery, and there was no significant focal narrowing.  The procedure was completed without  complication.  HEMODYNAMIC DATA: The central aortic pressure was 121/74.  LV pressure 123/8. There was no gradient on pullback across the aortic valve.  ANGIOGRAPHIC DATA: 1. Left ventriculography performed in the RAO projection revealed preserved    global systolic function, no segmental abnormalities or contracture were    identified.  Ejection fraction was in excess of 55%.  Significant mitral    regurgitation was not demonstrated. 2. Aortic root aortography revealed no evidence of aortic regurgitation.    There was no evidence of dissection.  The aortic leaflets moved well. 3. The left main coronary artery was free of critical disease. 4. The left anterior descending artery coursed to the apex.  There was a    moderately large diagonal branch.  The LAD wrapped the apex.  No    significant focal narrowing was noted. 5. The circumflex provided a small to moderate first marginal branch followed    by a tiny second marginal branch.  The third marginal branch bifurcated    into two large marginal or posterolateral branches.  The A-V circumflex    itself was small.  No high-grade focal narrowings were noted. 6. The right coronary artery demonstrated ostial spasm on the initial    injection.  Following relief of this, we were able to engage the right    without drop in pressure.  Arteriography revealed a widely patent PDA and    posterolateral system.  CONCLUSIONS: 1. Normal left ventricular function. 2. No evidence of aortic dissection. 3. No high-grade coronary stenosis.  DISPOSITION:  We will have the patient follow  up with Dr. Corine Shelter.  Limitation of the patients caffeine intake will be recommended. DD:  04/29/00 TD:  04/30/00 Job: [id number]

## 2010-08-21 NOTE — Discharge Summary (Signed)
[place]. [facility]  Patient:    [name] [name], [name] [name]                      MRN: [id number] Adm. Date:  [id number] Disc. Date: [id number] Attending:  Farley Ly Dictator:   Susie Cassette, M.D. CC:         [name] [name]. Sharyn Lull, M.D.   Discharge Summary  DISCHARGE DIAGNOSIS:  Chest pain, ruled out for myocardial infarction.  DISCHARGE MEDICATIONS: 1. Aspirin 325 mg p.o. q.d. 2. Toprol XL 25 mg p.o. q.d.  DISCHARGE FOLLOWUP:  Ms. Evelyn was instructed to follow up with Dr. Sharyn Lull in two weeks and was given the number to call for an appointment, [phone].  PROCEDURES: 1. CT of the chest and lower extremities:  CT of the chest was normal with no    evidence of acute pulmonary embolism.  CT of the lower extremities revealed    no evidence of acute DVT of lower extremities or pelvic veins. 2. An echocardiogram which revealed overall left ventricular systolic function    that was normal with no regional wall abnormalities. 3. Stress Cardiolite which revealed normal perfusion with no ischemia and an    ejection fraction of 61%.  CONSULTATIONS:  Cardiology, Dr. Sharyn Lull, on April 14, 2000.  HISTORY OF PRESENT ILLNESS:  Ms. [name] [name] is a 60 year old white female with a history of arrhythmia during pregnancy, obesity, family medical history of early CAD, and over-the-counter stimulant use (Metabolife) who presents to the ED complaining of chest pain.  She states that the episode began at 5 p.m. today, both sharp pain and pressure, that was midsternum and anterior chest, and was accompanied by a rapid heart beat.  She also reports "air hunger"/short of breath, cyanosis over the upper lip, hands tingling, and lightheaded feeling.  She reports that this began while at rest when she was working at a computer.  There was on clear precipitating factor.  She states that it was worse leaning forward or during expiration, and that the pain was better while lying supine  or on inspiration.  She denies fevers, chills, cough, sputum, or hemoptysis.  She also denies syncope or numbness, but does report generalized weakness.  She also had a similar episode of Monday that lasted 30-45 minutes.  She states the pain is a severity of 3-4/10 at its worst.  She also reports some discomfort in her back and head as well.  PAST MEDICAL HISTORY:  Significant for: 1. Obesity. 2. History of arrhythmia during pregnancy 15 years ago. 3. History of left lower extremity swelling/redness/tenderness that was    Doppler negative for DVT three years ago. 4. Fibroids. 5. History of hypothyroidism years ago (off Synthroid now). 6. Miscarriage status post D&C.  PAST SURGICAL HISTORY: 1. Status post C-sections x 2. 2. Status post hysterectomy. 3. Status post tonsillectomy. 4. 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 5. Strabismus repair as an infant. 6. Fractures of left foot, status post fusion.  ALLERGIES:  PERCODAN which causes hallucination and CORTICOSPORIN OTIC which causes blisters.  CURRENT MEDICATIONS: 1. Melatonin as needed for insomnia. 2. Metabolife "on and off" for past one year, two tablets q.d. maximum, last    dose was one tablet on Monday. 3. Denies use of any other prescription, over-the-counter, or herbal    supplemental medicines.  SOCIAL HISTORY:  Ms. [name] [name] lives in [place] with her husband and her 28 year old child.  She  works as a Sports coach at [employer].  Her husband is a Orthoptist at [employer].  They also have an 25 year old child away at college. She denies alcohol, tobacco, or illicit drug use.  FAMILY MEDICAL HISTORY:  Significant for her mother who died of renal failure. She also had diabetes, CABG, CHF, early onset CAD less than 18 years old, COPD, and obesity.  Father died of an MVA with no known CAD.  She has three siblings  who are alive and well and two children who are alive and well.  REVIEW OF SYSTEMS:  Negative for unintentional weight loss, diabetes, hypertension, coronary artery disease, dyslipidemia, prior DVT, pulmonary embolism, pericarditis, visual or hearing changes, hematemesis, cough, sputum, wheezing, hemoptysis, nausea/vomiting, diarrhea, melena, hematochezia, edema, dysuria, rash.  Her Review of Systems is significant for chronic constipation and "probable irritable bowel syndrome."  PHYSICAL EXAMINATION ON ADMISSION:  VITAL SIGNS:  Temperature 98.6, blood pressure 106/72, pulse 105, respirations 16, O2 saturation 100% on room air, blood pressure right arm 106/68, left 104/70, no pulsus paradoxus seen.  Lying her blood pressure was 104/70 with heart rate of 110; standing 110/70 with a heart rate of 135.  GENERAL APPEARANCE:  No acute distress, alert and oriented x 4.  HEENT:  Atraumatic, conjunctivae/nares/pharynx/TMs clear, fundi unremarkable. Conjunctivae pink.  NECK:  Supple, nontender with a nontender thyroid.  No cervical adenopathy.  CARDIOVASCULAR:  Rapid, regular, patient examined in ED in lying/standing/squatting position.  No audible murmur or rub.  Questionable gout versus split heart sound.  No JVD, pulse is symmetric, no cyanosis.  CHEST:  Respirations clear, symmetric, resonant/good, good air movement.  ABDOMEN:  Normoactive bowel sounds, soft, nontender, nondistended.  EXTREMITIES:  Warm, dry, trace edema.  SKIN:  No rash.  NEUROLOGIC:  Pupils equally round and reactive to light, face symmetric, moving all extremities.  GENITOURINARY:  RECTAL:  Hemoccult-negative brown stool.  ADMISSION LABORATORY AND X-RAY DATA:  CBC:  White count 12.4, hemoglobin 13.7, platelets 353, ANC 6.9.  BMP:  Sodium 140, potassium 3.6, chloride 107, bicarb 17, BUN 13, creatinine 1.2, and glucose 93.  Anion gap 16.  CK 128, MB less  than 0.3, troponin I less than 0.1.  PT 13.2, INR  1.0, PTT 28.  Portable chest x-ray unremarkable; No infiltrate, edema, cardiomegaly, pneumothorax, or atelectasis evident.  CT scan of the chest:  No PE, no abnormal findings.  CT scan of the extremities:  No DVT.  EKG:  Sinus tachycardia/rate 102, normal intervals, normal axis, no delta wave, normal T waves, no ST changes, no significant Q waves.  HOSPITAL COURSE:  CHEST PAIN:  Ms. [name] [name] was admitted with chest pain/rule out MI diagnosis.  The patient possibility of structural heart disease was also considered as a causing an arrhythmia that may have precipitated her symptom complex.  In addition, the possibility of pulmonary embolism was considered, and a CT was checked which was negative for PE.  Lower extremity Doppler was also negative for sign of a DVT, and finally a D-dimer that was reported the following day was normal at 0.24.  Finally, Ms. [name] [name] did report taking a stimulant called Metabolife which does contain ephedrine-like chemicals that may have triggered the possibility of an arrhythmia rather that structural heart disease.  Following admission, Ms. [name] [name] was ruled out MI with negative enzymes and normal EKGs with no ischemia.  However, on the day following admission, Ms. [name] [name] did have a 28-beat run of ventricular  tachycardia. Because of this and because of her family medical history of coronary artery disease, cardiology consult was called on the day following admission, and stress Cardiolite was ordered.  In addition, cardiology added a low-dose beta-blocker and increased the Lovenox for DVT prophylaxis to full-strength doses.  The following day a stress Cardiolite was performed which revealed normal perfusion without signs of ischemia and an ejection fraction of 61%. In addition, an echo ordered on admission revealed overall LV systolic function that was normal with no regional wall abnormalities.  Finally, all lab tests ordered were normal including a TSH and lipid  panel.  Therefore, cardiology felt there was no evidence of structural heart disease, and recommended discharging her on Toprol 25 q.d. and follow-up in two weeks.  Ms. [name] [name] was discharged on this regimen of aspirin and Toprol and follow-up with Dr. Sharyn Lull was arranged in two weeks.  In addition, the patient was advised to not take any more Metabolife.  DISCHARGE LABORATORIES:  CBC:  Hemoglobin 11.9, white blood cell count 6.9, platelets 274.  BMP:  Sodium 139, potassium 3.6, chloride 107, CO2 24, glucose 93, BUN 8, creatinine 0.7, and calcium 8.2.  DISPOSITION:  Ms. [name] [name] was discharged to home in good condition.  RESIDENT:  Dr. Karlene Einstein.  INTERN:  Dr. Lyanne Co. DD:  04/20/00 TD:  04/21/00 Job: [id number]

## 2010-08-26 ENCOUNTER — Ambulatory Visit (INDEPENDENT_AMBULATORY_CARE_PROVIDER_SITE_OTHER): Payer: BC Managed Care – PPO | Admitting: Family Medicine

## 2010-08-26 ENCOUNTER — Encounter: Payer: Self-pay | Admitting: Family Medicine

## 2010-08-26 VITALS — BP 122/82 | HR 86 | Temp 98.4°F | Ht <= 58 in | Wt 187.1 lb

## 2010-08-26 DIAGNOSIS — S30860A Insect bite (nonvenomous) of lower back and pelvis, initial encounter: Secondary | ICD-10-CM

## 2010-08-26 DIAGNOSIS — W57XXXA Bitten or stung by nonvenomous insect and other nonvenomous arthropods, initial encounter: Secondary | ICD-10-CM | POA: Insufficient documentation

## 2010-08-26 NOTE — Progress Notes (Signed)
60 yo here for tick bite.  Noticed it on her upper right back on Saturday afternoon, it was not there on Saturday morning. A friend removed it.  Immediately after removal, noticed quarter sized erythematous plaque, now much smaller in size. No fevers, chills, other rashes, headaches, photophobia, headaches, nausea or vomiting.  The PMH, PSH, Social History, Family History, Medications, and allergies have been reviewed in [facility], and have been updated if relevant.   Review of Systems       See HPI General:  Denies chills and fever. Resp:  Denies cough and wheezing.  Physical Exam BP 122/82  Pulse 86  Temp(Src) 98.4 F (36.9 C) (Oral)  Ht 4\' 10"  (1.473 m)  Wt 187 lb 1.9 oz (84.877 kg)  BMI 39.11 kg/m2  General:  alert and overweight-appearing.   Mouth:  good dentition.   Skin:  nickle sized, erythematous raised plaque on upper right back, no central clearing, no other rashes. Psych:  Cognition and judgment appear intact. Alert and cooperative with normal attention span and concentration. No apparent delusions, illusions, hallucinations

## 2010-08-26 NOTE — Assessment & Plan Note (Signed)
New. Reassurance provided. Not not appear infected, no signs of tick borne illness. Red flag symptoms discussed. See pt instructions for details.

## 2010-08-26 NOTE — Patient Instructions (Signed)
Deer Tick Bites Deer ticks are brown arachnids (spider family) that vary in size from as small as the head of a pin to 1/4 inch (1/2 cm) diameter. They thrive in wooded areas. Deer are the preferred host of adult deer ticks. Small rodents are the host of young ticks (nymphs). When a person walks in a field or wooded area, young and adult ticks in the surrounding grass and vegetation can attach themselves to the skin. They can suck blood for hours to days if unnoticed. Ticks are found all over the [place] Some ticks carry a specific bacteria (Borrelia burgdorferi) that causes an infection called Lyme disease. The bacteria is typically passed into a person during the blood sucking process. This happens after the tick has been attached for at least a number of hours. While ticks can be found all over the [place] those carrying the bacteria that causes Lyme disease are most common in [place] and the [place]. Only a small proportion of ticks in these areas carry the Lyme disease bacteria and cause human infections. Ticks usually attach to warm spots on the body, such as the:  Head.   Back.   Neck.   Armpits.   Groin.  SYMPTOMS Most of the time, a deer tick bite will not be felt. You may or may not see the attached tick. You may notice mild irritation or redness around the bite site. If the deer tick passes the Lyme disease bacteria to a person, a round, red rash may be noticed 2 to 3 days after the bite. The rash may be clear in the middle, like a bull's-eye or target. If not treated, other symptoms may develop several days to weeks after the onset of the rash. These symptoms may include:  New rash lesions.   Fatigue and weakness.   General ill feeling and achiness.   Chills.   Headache and neck pain.   Swollen lymph glands.   Sore muscles and joints.  5 to 15% of untreated people with Lyme disease may develop more severe illnesses after several weeks to months. This may include  inflammation of the brain lining (meningitis), nerve palsies, an abnormal heartbeat, or severe muscle and joint pain and inflammation (myositis or arthritis). DIAGNOSIS  Physical exam and medical history.   Viewing the tick if it was saved for confirmation.   Blood tests (to check or confirm the presence of Lyme disease).  TREATMENT Most ticks do not carry disease. If found, an attached tick should be removed using tweezers. Tweezers should be placed under the body of the tick so it is removed by its attachment parts (pincers). If there are signs or symptoms of being sick, or Lyme disease is confirmed, medicines (antibiotics) that kill germs are usually prescribed. In more severe cases, antibiotics may be given through an intravenous (IV) access. HOME CARE INSTRUCTIONS  Always remove ticks with tweezers. Do not use petroleum jelly or other methods to kill or remove the tick. Slide the tweezers under the body and pull out as much as you can. If you are not sure what it is, save it in a jar and show your caregiver.   Once you remove the tick, the skin will heal on its own. Wash your hands and the affected area with water and soap. You may place a bandage on the affected area.   Take medicine as directed. You may be advised to take a full course of antibiotics.   Follow up with your caregiver as   recommended.  FINDING OUT THE RESULTS OF YOUR TEST Not all test results are available during your visit. If your test results are not back during the visit, make an appointment with your caregiver to find out the results. Do not assume everything is normal if you have not heard from your caregiver or the medical facility. It is important for you to follow up on all of your test results. PROGNOSIS If Lyme disease is confirmed, early treatment with antibiotics is very effective. Following preventive guidelines is important since it is possible to get the disease more than once. PREVENTION  Wear long  sleeves and long pants in wooded or grassy areas. Tuck your pants into your socks.   Use an insect repellent while hiking.   Check yourself, your children, and your pets regularly for ticks after playing outside.   Clear piles of leaves or brush from your yard. Ticks might live there.  SEEK MEDICAL CARE IF:  You or your child has an oral temperature above 100.4.   You develop a severe headache following the bite.   You feel generally ill.   You notice a rash.   You are having trouble removing the tick.   The bite area has red skin or yellow drainage.  SEEK IMMEDIATE MEDICAL CARE IF:  Your face is weak and droopy or you have other neurological symptoms.   You have severe joint pain or weakness.  MAKE SURE YOU:  Understand these instructions.   Will watch your condition.   Will get help right away if you are not doing well or get worse.  FOR MORE INFORMATION: Centers for Disease Control and Prevention: www.cdc.gov American Academy of [employer] Physicians: www.aafp.org Document Released: 06/16/2009  ExitCare Patient Information 2011 [employer]. 

## 2010-12-03 ENCOUNTER — Encounter: Payer: Self-pay | Admitting: Family Medicine

## 2010-12-03 ENCOUNTER — Ambulatory Visit (INDEPENDENT_AMBULATORY_CARE_PROVIDER_SITE_OTHER): Payer: BC Managed Care – PPO | Admitting: Family Medicine

## 2010-12-03 DIAGNOSIS — R309 Painful micturition, unspecified: Secondary | ICD-10-CM

## 2010-12-03 DIAGNOSIS — N390 Urinary tract infection, site not specified: Secondary | ICD-10-CM

## 2010-12-03 DIAGNOSIS — R109 Unspecified abdominal pain: Secondary | ICD-10-CM

## 2010-12-03 DIAGNOSIS — R300 Dysuria: Secondary | ICD-10-CM

## 2010-12-03 LAB — POCT URINALYSIS DIPSTICK
Ketones, UA: NEGATIVE
Nitrite, UA: NEGATIVE
Protein, UA: NEGATIVE
Urobilinogen, UA: 0.2

## 2010-12-03 MED ORDER — CIPROFLOXACIN HCL 500 MG PO TABS: 500.0000 mg | ORAL_TABLET | Freq: Two times a day (BID) | ORAL | Status: DC

## 2010-12-03 NOTE — Progress Notes (Signed)
  Subjective:    Patient ID: [name] [name], female    DOB: [birth year], 60 y.o.   MRN: [id number]  HPI CC: UTI?  3d h/o increased lower urinary symptoms.  Urgency, frequency, dysuria, back pain (flank), cloudy urine, no visible blood.  Mild abd pressure.  No fevers/chills, nausea/vomiting.  Last UTI was several years ago.  No h/o kidney stones.  Review of Systems Per HPI    Objective:   Physical Exam  Nursing note and vitals reviewed. Constitutional: She appears well-developed and well-nourished.  HENT:  Head: Normocephalic and atraumatic.  Mouth/Throat: No oropharyngeal exudate.  Abdominal: Soft. Bowel sounds are normal. She exhibits no distension and no mass. There is tenderness (suprapubic). There is CVA tenderness (mild bilateral). There is no rebound and no guarding.  Skin: Skin is warm and dry. No rash noted.          Assessment & Plan:

## 2010-12-03 NOTE — Patient Instructions (Addendum)
looks like UTI.  Treat with 5 day course of cipro twice daily. Push fluids (water, cranberry juice), tylenol or azo for discomfort. Call us with questions or if not better after antibiotics.

## 2010-12-03 NOTE — Assessment & Plan Note (Addendum)
sxs and UA consistent with UTI. No UCx sent. Treat with cipro 500mg  twice daily for 5 days (given some flank pain).

## 2011-04-13 ENCOUNTER — Ambulatory Visit (INDEPENDENT_AMBULATORY_CARE_PROVIDER_SITE_OTHER): Payer: BC Managed Care – PPO | Admitting: Family Medicine

## 2011-04-13 ENCOUNTER — Encounter: Payer: Self-pay | Admitting: Family Medicine

## 2011-04-13 VITALS — BP 130/80 | HR 78 | Temp 98.1°F | Ht <= 58 in | Wt 193.5 lb

## 2011-04-13 DIAGNOSIS — I209 Angina pectoris, unspecified: Secondary | ICD-10-CM

## 2011-04-13 DIAGNOSIS — R5383 Other fatigue: Secondary | ICD-10-CM

## 2011-04-13 DIAGNOSIS — E559 Vitamin D deficiency, unspecified: Secondary | ICD-10-CM

## 2011-04-13 DIAGNOSIS — R079 Chest pain, unspecified: Secondary | ICD-10-CM | POA: Insufficient documentation

## 2011-04-13 DIAGNOSIS — Z0000 Encounter for general adult medical examination without abnormal findings: Secondary | ICD-10-CM

## 2011-04-13 DIAGNOSIS — M899 Disorder of bone, unspecified: Secondary | ICD-10-CM

## 2011-04-13 DIAGNOSIS — Z1231 Encounter for screening mammogram for malignant neoplasm of breast: Secondary | ICD-10-CM

## 2011-04-13 DIAGNOSIS — R5381 Other malaise: Secondary | ICD-10-CM

## 2011-04-13 DIAGNOSIS — M949 Disorder of cartilage, unspecified: Secondary | ICD-10-CM

## 2011-04-13 DIAGNOSIS — Z136 Encounter for screening for cardiovascular disorders: Secondary | ICD-10-CM

## 2011-04-13 LAB — BASIC METABOLIC PANEL
BUN: 10 mg/dL (ref 6–23)
CO2: 26 mEq/L (ref 19–32)
Calcium: 8.8 mg/dL (ref 8.4–10.5)
Chloride: 107 mEq/L (ref 96–112)
Creatinine, Ser: 0.6 mg/dL (ref 0.4–1.2)
GFR: 102.84 mL/min (ref >60.00)
Glucose, Bld: 87 mg/dL (ref 70–99)
Potassium: 4.1 mEq/L (ref 3.5–5.1)
Sodium: 141 mEq/L (ref 135–145)

## 2011-04-13 LAB — LIPID PANEL
Cholesterol: 218 mg/dL — ABNORMAL HIGH (ref 0–200)
Triglycerides: 103 mg/dL (ref 0.0–149.0)

## 2011-04-13 LAB — TSH: TSH: 2.08 u[IU]/mL (ref 0.35–5.50)

## 2011-04-13 LAB — LDL CHOLESTEROL, DIRECT: Direct LDL: 134.4 mg/dL

## 2011-04-13 NOTE — Patient Instructions (Signed)
Please stop by to see [name] [name] on your way out.  Derm otic.

## 2011-04-13 NOTE — Progress Notes (Signed)
61 yo here for CPX.  1.  Vitamin D deficiency-  Has had some episodes of fatigue.  2.  h/o coronary vasospams- followed by Dr. Myrtis Ser.  Has not had an EKG in two years.  Typically occurs at rest, has multiple short episodes per month.  Had a couple of episodes of CP over past several months.  Sometimes related to exertion, other times it is not. Usually does not radiate. Not associated with diaphoresis or nausea.    EKG today- NSR with occasional PACs  3.  osteopenia- last DEXA was in 10/2009.  H/o two vertebral fractures and foot fractures.  Taking Caltrate and receiving twice yearly prolia injections.   Well woman- s/p hyesterecomy for fibroids- still has cervix and ovaries. UTD colonoscopy, tetanus.  Wt Readings from Last 3 Encounters:  04/13/11 193 lb 8 oz (87.771 kg)  12/03/10 190 lb 4 oz (86.297 kg)  08/26/10 187 lb 1.9 oz (84.877 kg)      Patient Active Problem List  Diagnoses  . CANDIDIASIS OF UNSPECIFIED SITE  . UNSPECIFIED VITAMIN D DEFICIENCY  . CORONARY ARTERY SPASM  . OSTEOPENIA  . Tick bite  . UTI (lower urinary tract infection)  . Routine general medical examination at a health care facility   Past Medical History  Diagnosis Date  . Coronary vasospasm     Dr. Myrtis Ser  . Osteoporosis   . Vitamin D deficiency   . Strabismus    Past Surgical History  Procedure Date  . Appendectomy   . Abdominal hysterectomy   . Tonsillectomy   . Strabismus repair    History  Substance Use Topics  . Smoking status: Never Smoker   . Smokeless tobacco: Not on file  . Alcohol Use: No   Family History  Problem Relation Age of Onset  . Hypertension Mother   . Diabetes Mother   . Obesity Mother    No Known Allergies Current Outpatient Prescriptions on File Prior to Visit  Medication Sig Dispense Refill  . aspirin (ASPIRIN LOW DOSE) 81 MG tablet Take 81 mg by mouth daily.        . Calcium Carbonate (CALCIUM 500 PO) Apply 2-3 times daily to affected area       .  denosumab (PROLIA) 60 MG/ML SOLN One injection subcutaneously every six months       . docusate sodium (COLACE) 100 MG capsule Take 100 mg by mouth 3 (three) times daily as needed.        Marland Kitchen ibuprofen (ADVIL,MOTRIN) 200 MG tablet Take 200 mg by mouth every 6 (six) hours as needed.        . Multiple Vitamin (MULTIVITAMIN) capsule Take 1 capsule by mouth daily.        . naproxen sodium (ANAPROX) 220 MG tablet Take 440mg  to 880mg  as needed       . nystatin (MYCOSTATIN) powder         Current Facility-Administered Medications on File Prior to Visit  Medication Dose Route Frequency Provider Last Rate Last Dose  . denosumab (PROLIA) injection 60 mg  60 mg Subcutaneous Q6 months Ruthe Mannan, MD   60 mg at 06/30/10 1610   The PMH, PSH, Social History, Family History, Medications, and allergies have been reviewed in [facility], and have been updated if relevant.   Review of Systems       See HPI Patient reports no  vision/ hearing changes,anorexia, weight change, fever ,adenopathy, persistant / recurrent hoarseness, swallowing issues, chest pain, edema,persistant / recurrent cough,  hemoptysis, dyspnea(rest, exertional, paroxysmal nocturnal), gastrointestinal  bleeding (melena, rectal bleeding), abdominal pain, excessive heart burn, GU symptoms(dysuria, hematuria, pyuria, voiding/incontinence  Issues) syncope, focal weakness, severe memory loss, concerning skin lesions, depression, anxiety, abnormal bruising/bleeding, major joint swelling, breast masses or abnormal vaginal bleeding.     Physical Exam BP 130/80  Pulse 78  Temp(Src) 98.1 F (36.7 C) (Oral)  Ht 4\' 10"  (1.473 m)  Wt 193 lb 8 oz (87.771 kg)  BMI 40.44 kg/m2   General:  alert and overweight-appearing.   Head:  normocephalic.   Eyes:  pupils equal, pupils round, pupils reactive to light, and strabismus.   Ears:  R ear normal and L ear normal.   Nose:  no external deformity.   Mouth:  good dentition.   Neck:  No deformities, masses, or  tenderness noted. Breasts:  No mass, nodules, thickening, tenderness, bulging, retraction, inflamation, nipple discharge or skin changes noted.   Lungs:  Normal respiratory effort, chest expands symmetrically. Lungs are clear to auscultation, no crackles or wheezes. Heart:  Normal rate and regular rhythm. S1 and S2 normal without gallop, murmur, click, rub or other extra sounds. Abdomen:  Bowel sounds positive,abdomen soft and non-tender without masses, organomegaly or hernias noted. Msk:  No deformity or scoliosis noted of thoracic or lumbar spine.   Extremities:  no edema Neurologic:  alert & oriented X3 and gait normal.   Skin:  Intact without suspicious lesions or rashes Psych:  Cognition and judgment appear intact. Alert and cooperative with normal attention span and concentration. No apparent delusions, illusions, hallucinations  Assessment and Plan: 1. Routine general medical examination at a health care facility  Reviewed preventive care protocols, scheduled due services, and updated immunizations Discussed nutrition, exercise, diet, and healthy lifestyle.  Basic Metabolic Panel (BMET) MM Digital Screening Lipid Profile  2. Vitamin D deficiency  Vitamin D (25 hydroxy)  3. Chest pain   Deteriorated, currently asymptomatic. Likely due to vasospasm.  EKG reassuring today. EKG 12-Lead  4. Fatigue  TSH, T4, free

## 2011-04-14 ENCOUNTER — Encounter: Payer: Self-pay | Admitting: *Deleted

## 2011-04-14 LAB — VITAMIN D 25 HYDROXY (VIT D DEFICIENCY, FRACTURES): Vit D, 25-Hydroxy: 30 ng/mL (ref 30–89)

## 2011-04-22 ENCOUNTER — Other Ambulatory Visit: Payer: Self-pay | Admitting: Family Medicine

## 2011-04-22 DIAGNOSIS — R928 Other abnormal and inconclusive findings on diagnostic imaging of breast: Secondary | ICD-10-CM | POA: Insufficient documentation

## 2011-04-28 ENCOUNTER — Encounter: Payer: Self-pay | Admitting: Family Medicine

## 2011-05-03 ENCOUNTER — Encounter: Payer: Self-pay | Admitting: Family Medicine

## 2011-05-05 ENCOUNTER — Encounter: Payer: Self-pay | Admitting: *Deleted

## 2011-06-03 ENCOUNTER — Ambulatory Visit (INDEPENDENT_AMBULATORY_CARE_PROVIDER_SITE_OTHER): Payer: BC Managed Care – PPO | Admitting: Family Medicine

## 2011-06-03 ENCOUNTER — Encounter: Payer: Self-pay | Admitting: Family Medicine

## 2011-06-03 VITALS — BP 130/88 | HR 64 | Temp 98.1°F | Wt 194.0 lb

## 2011-06-03 DIAGNOSIS — H6120 Impacted cerumen, unspecified ear: Secondary | ICD-10-CM | POA: Insufficient documentation

## 2011-06-03 NOTE — Progress Notes (Signed)
HPI:  61 yo female well known to me with h/o left radical mastoidectomy here for ?cerumen impaction in right ear.  Noticed past several days-increased pressure. ENT recently removed cerumen from left year.  Mild hearing loss in right ear (cannot hear out of left ear). No discharge. Mild discomfort. No URI symptoms.  ROS: See HPI  Patient Active Problem List  Diagnoses  . CANDIDIASIS OF UNSPECIFIED SITE  . UNSPECIFIED VITAMIN D DEFICIENCY  . CORONARY ARTERY SPASM  . OSTEOPENIA  . Tick bite  . UTI (lower urinary tract infection)  . Routine general medical examination at a health care facility  . Chest pain  . Abnormal mammogram  . Cerumen impaction   Past Medical History  Diagnosis Date  . Coronary vasospasm     Dr. Myrtis Ser  . Osteoporosis   . Vitamin d deficiency   . Strabismus    Past Surgical History  Procedure Date  . Appendectomy   . Abdominal hysterectomy   . Tonsillectomy   . Strabismus repair    History  Substance Use Topics  . Smoking status: Never Smoker   . Smokeless tobacco: Not on file  . Alcohol Use: No   Family History  Problem Relation Age of Onset  . Hypertension Mother   . Diabetes Mother   . Obesity Mother    No Known Allergies Current Outpatient Prescriptions on File Prior to Visit  Medication Sig Dispense Refill  . aspirin (ASPIRIN LOW DOSE) 81 MG tablet Take 81 mg by mouth daily.        . Calcium Carbonate (CALCIUM 500 PO) Apply 2-3 times daily to affected area       . denosumab (PROLIA) 60 MG/ML SOLN One injection subcutaneously every six months       . docusate sodium (COLACE) 100 MG capsule Take 100 mg by mouth 3 (three) times daily as needed.        Marland Kitchen ibuprofen (ADVIL,MOTRIN) 200 MG tablet Take 200 mg by mouth every 6 (six) hours as needed.        . Multiple Vitamin (MULTIVITAMIN) capsule Take 1 capsule by mouth daily.        . naproxen sodium (ANAPROX) 220 MG tablet Take 440mg  to 880mg  as needed       . nystatin (MYCOSTATIN)  powder         Current Facility-Administered Medications on File Prior to Visit  Medication Dose Route Frequency Provider Last Rate Last Dose  . denosumab (PROLIA) injection 60 mg  60 mg Subcutaneous Q6 months Ruthe Mannan, MD   60 mg at 06/30/10 1610   The PMH, PSH, Social History, Family History, Medications, and allergies have been reviewed in [facility], and have been updated if relevant.  Physical exam: BP 130/88  Pulse 64  Temp(Src) 98.1 F (36.7 C) (Oral)  Wt 194 lb (87.998 kg) Gen:  Alert, pleasant, NAD HEENT: right ear: Ceruminosis is noted.    Assessment and Plan: 1. Cerumen impaction    Wax is removed by syringing and manual debridement. Instructions for home care to prevent wax buildup are given.

## 2011-09-13 ENCOUNTER — Encounter: Payer: Self-pay | Admitting: Family Medicine

## 2011-09-13 ENCOUNTER — Ambulatory Visit (INDEPENDENT_AMBULATORY_CARE_PROVIDER_SITE_OTHER): Payer: BC Managed Care – PPO | Admitting: Family Medicine

## 2011-09-13 VITALS — BP 140/82 | HR 68 | Temp 98.0°F | Wt 196.0 lb

## 2011-09-13 DIAGNOSIS — R300 Dysuria: Secondary | ICD-10-CM

## 2011-09-13 LAB — POCT URINALYSIS DIPSTICK
Bilirubin, UA: NEGATIVE
Ketones, UA: NEGATIVE
Nitrite, UA: NEGATIVE

## 2011-09-13 MED ORDER — CIPROFLOXACIN HCL 500 MG PO TABS: 500.0000 mg | ORAL_TABLET | Freq: Two times a day (BID) | ORAL | Status: AC

## 2011-09-13 NOTE — Progress Notes (Signed)
SUBJECTIVE: [name] [name] is a 61 y.o. female who complains of urinary frequency, urgency and dysuria x 4 days, without flank pain, fever, or abnormal vaginal discharge or bleeding.   She did have some chills this morning.  Patient Active Problem List  Diagnoses  . CANDIDIASIS OF UNSPECIFIED SITE  . UNSPECIFIED VITAMIN D DEFICIENCY  . CORONARY ARTERY SPASM  . OSTEOPENIA  . Tick bite  . UTI (lower urinary tract infection)  . Routine general medical examination at a health care facility  . Chest pain  . Abnormal mammogram  . Cerumen impaction   Past Medical History  Diagnosis Date  . Coronary vasospasm     Dr. Myrtis Ser  . Osteoporosis   . Vitamin d deficiency   . Strabismus    Past Surgical History  Procedure Date  . Appendectomy   . Abdominal hysterectomy   . Tonsillectomy   . Strabismus repair    History  Substance Use Topics  . Smoking status: Never Smoker   . Smokeless tobacco: Not on file  . Alcohol Use: No   Family History  Problem Relation Age of Onset  . Hypertension Mother   . Diabetes Mother   . Obesity Mother    No Known Allergies Current Outpatient Prescriptions on File Prior to Visit  Medication Sig Dispense Refill  . aspirin (ASPIRIN LOW DOSE) 81 MG tablet Take 81 mg by mouth daily.        . Calcium Carbonate (CALCIUM 500 PO) Apply 2-3 times daily to affected area       . docusate sodium (COLACE) 100 MG capsule Take 100 mg by mouth 3 (three) times daily as needed.        Marland Kitchen ibuprofen (ADVIL,MOTRIN) 200 MG tablet Take 200 mg by mouth every 6 (six) hours as needed.        . Multiple Vitamin (MULTIVITAMIN) capsule Take 1 capsule by mouth daily.        . naproxen sodium (ANAPROX) 220 MG tablet Take 440mg  to 880mg  as needed       . nystatin (MYCOSTATIN) powder         The PMH, PSH, Social History, Family History, Medications, and allergies have been reviewed in [facility], and have been updated if relevant.  OBJECTIVE:  BP 140/82  Pulse 68  Temp(Src) 98 F  (36.7 C) (Oral)  Wt 196 lb (88.905 kg) Appears well, in no apparent distress.  Vital signs are normal. The abdomen is soft without tenderness, guarding, mass, rebound or organomegaly. No CVA tenderness or inguinal adenopathy noted. Urine dipstick shows positive for RBC's and positive for leukocytes.   ASSESSMENT: UTI uncomplicated without evidence of pyelonephritis  PLAN: Treatment per orders - cipro 500 mg twice daily x 5 days, also push fluids, may use Pyridium OTC prn. Call or return to clinic prn if these symptoms worsen or fail to improve as anticipated.

## 2011-09-15 LAB — URINE CULTURE

## 2011-11-24 ENCOUNTER — Telehealth: Payer: Self-pay

## 2011-11-24 NOTE — Telephone Encounter (Signed)
Pt request order for tb skin test for work. Please advise.

## 2011-11-24 NOTE — Telephone Encounter (Signed)
OK to use my rx pad and write order with my stamp.

## 2011-11-24 NOTE — Telephone Encounter (Signed)
Pt just needs PPD for work, nurse visit scheduled.

## 2011-11-26 ENCOUNTER — Ambulatory Visit (INDEPENDENT_AMBULATORY_CARE_PROVIDER_SITE_OTHER): Payer: BC Managed Care – PPO | Admitting: Family Medicine

## 2011-11-26 ENCOUNTER — Telehealth: Payer: Self-pay

## 2011-11-26 ENCOUNTER — Encounter: Payer: Self-pay | Admitting: Family Medicine

## 2011-11-26 VITALS — BP 124/82 | HR 93 | Temp 98.2°F | Wt 193.0 lb

## 2011-11-26 DIAGNOSIS — R82998 Other abnormal findings in urine: Secondary | ICD-10-CM

## 2011-11-26 DIAGNOSIS — N390 Urinary tract infection, site not specified: Secondary | ICD-10-CM

## 2011-11-26 LAB — POCT URINALYSIS DIPSTICK
Blood, UA: NEGATIVE
Ketones, UA: NEGATIVE
Leukocytes, UA: NEGATIVE
Nitrite, UA: NEGATIVE
Protein, UA: NEGATIVE
pH, UA: 7.5

## 2011-11-26 NOTE — Patient Instructions (Addendum)
We'll contact you with your lab report.  Take care.  Drink plenty of fluids.

## 2011-11-26 NOTE — Telephone Encounter (Signed)
Pt has flank pain both sides with cloudy urine. Pt had UTI in June 2013 pt not sure if UTI completely went away. Pt to see Dr Para March today.

## 2011-11-26 NOTE — Progress Notes (Signed)
She had some urinary pressure recently with an odor noted.  Yesterday she had cloudy urine with sediment in AM.  Some mild flank pain but she has h/o back pain episodically at baseline.  No h/o renal stones.  No FCNAVD.  Minimal back pain today, lower back paraspinal tenderness.    Meds, vitals, and allergies reviewed.   ROS: See HPI.  Otherwise, noncontributory.  nad ncat rrr ctab L lower paraspinal tenderness, mild No CVA pain abd not ttp, normal BS

## 2011-11-28 LAB — URINE CULTURE

## 2011-11-28 NOTE — Assessment & Plan Note (Signed)
Possible, if it were present it is likely resolved.  Ucx neg, see notes on labs.  Back pain is likely at baseline.  D/w pt re: plan.  If ucx neg, no further w/u.

## 2011-11-30 ENCOUNTER — Ambulatory Visit: Payer: BC Managed Care – PPO

## 2012-05-04 ENCOUNTER — Other Ambulatory Visit: Payer: Self-pay

## 2012-05-04 MED ORDER — NYSTATIN 100000 UNIT/GM EX POWD: Freq: Two times a day (BID) | CUTANEOUS | Status: DC

## 2012-05-04 NOTE — Telephone Encounter (Signed)
Pt left v/m requesting refill nystatin powder to [facility].Please advise.

## 2012-05-25 ENCOUNTER — Encounter: Payer: Self-pay | Admitting: Family Medicine

## 2012-05-25 ENCOUNTER — Ambulatory Visit (INDEPENDENT_AMBULATORY_CARE_PROVIDER_SITE_OTHER): Payer: BC Managed Care – PPO | Admitting: Family Medicine

## 2012-05-25 VITALS — BP 138/98 | HR 80 | Temp 98.0°F | Wt 194.0 lb

## 2012-05-25 DIAGNOSIS — J322 Chronic ethmoidal sinusitis: Secondary | ICD-10-CM | POA: Insufficient documentation

## 2012-05-25 DIAGNOSIS — H6120 Impacted cerumen, unspecified ear: Secondary | ICD-10-CM

## 2012-05-25 DIAGNOSIS — B379 Candidiasis, unspecified: Secondary | ICD-10-CM

## 2012-05-25 MED ORDER — AMOXICILLIN 875 MG PO TABS: 875.0000 mg | ORAL_TABLET | Freq: Two times a day (BID) | ORAL | Status: DC

## 2012-05-25 MED ORDER — NYSTATIN 100000 UNIT/GM EX POWD: Freq: Two times a day (BID) | CUTANEOUS | Status: DC

## 2012-05-25 NOTE — Patient Instructions (Signed)
Good to see you, [name] [name]. Take Amoxicillin as directed- 1 tablet twice daily x 10 days.  Drink lots of fluids.  Treat sympotmatically with Mucinex, nasal saline irrigation, and Tylenol/Ibuprofen. Call if not improving as expected in 5-7 days.

## 2012-05-25 NOTE — Progress Notes (Signed)
Subjective:    Patient ID: [name] [name], female    DOB: Aug 21, 1950, 62 y.o.   MRN: [id number]  HPI  62 yo female here for:  1.  Progressive URI symptoms x 12 days.  Initially started out with "typical cold symptoms"- scratchy throat, runny nose. Now has sinus pressure and right ear fullness and pressure (she is deaf in left ear). Taking Mucinex OTC with no improvement of symptoms.  No fever, CP or SOB.  2.  Right ear fullness- see above.  No drainage.  3.  Candidal breast infection- having a recurrent topical nystatin powder has helped in past.  No drainage from breast.  Patient Active Problem List  Diagnosis  . CANDIDIASIS OF UNSPECIFIED SITE  . CORONARY ARTERY SPASM  . OSTEOPENIA  . Chest pain  . Abnormal mammogram  . Cerumen impaction  . Ethmoid sinusitis   Past Medical History  Diagnosis Date  . Coronary vasospasm     Dr. Myrtis Ser  . Osteoporosis   . Vitamin D deficiency   . Strabismus    Past Surgical History  Procedure Laterality Date  . Appendectomy    . Abdominal hysterectomy    . Tonsillectomy    . Strabismus repair     History  Substance Use Topics  . Smoking status: Never Smoker   . Smokeless tobacco: Not on file  . Alcohol Use: No   Family History  Problem Relation Age of Onset  . Hypertension Mother   . Diabetes Mother   . Obesity Mother    No Known Allergies Current Outpatient Prescriptions on File Prior to Visit  Medication Sig Dispense Refill  . aspirin (ASPIRIN LOW DOSE) 81 MG tablet Take 81 mg by mouth daily.        . Calcium Carbonate (CALCIUM 500 PO) Take 2 capsules by mouth daily.       Marland Kitchen docusate sodium (COLACE) 100 MG capsule Take 100 mg by mouth 3 (three) times daily as needed.        Marland Kitchen ibuprofen (ADVIL,MOTRIN) 200 MG tablet Take 200 mg by mouth every 6 (six) hours as needed.        . Multiple Vitamin (MULTIVITAMIN) capsule Take 1 capsule by mouth daily.        . naproxen sodium (ANAPROX) 220 MG tablet Take 440mg  to 880mg  as needed         No current facility-administered medications on file prior to visit.   The PMH, PSH, Social History, Family History, Medications, and allergies have been reviewed in [facility], and have been updated if relevant.   Review of Systems See HPI    Objective:   Physical Exam BP 138/98  Pulse 80  Temp(Src) 98 F (36.7 C)  Wt 194 lb (87.998 kg)  BMI 40.56 kg/m2  SpO2 99%  Gen: Alert, pleasant, NAD  HEENT: right ear: Ceruminosis is noted.  Boggy turbinates, ethmoid tenderness, right >left Resp:  CTA bilaterally CVS:  RRR     Assessment & Plan:  1. Cerumen impaction  Wax is removed by syringing and manual debridement. Instructions for home care to prevent wax buildup are given.  2. Ethmoid sinusitis Given duration and progression of symptoms, will treat for bacterial sinusitis with Augmentin 875 mg twice daily x 10 days. Supportive care as outlined in pt instructions. Call or return to clinic prn if these symptoms worsen or fail to improve as anticipated. The patient indicates understanding of these issues and agrees with the plan.  3. Candidiasis of unspecified site  Deteriorated. Refilled nystatin.

## 2012-10-12 ENCOUNTER — Encounter: Payer: Self-pay | Admitting: Family Medicine

## 2012-10-13 ENCOUNTER — Encounter: Payer: Self-pay | Admitting: Family Medicine

## 2012-10-13 ENCOUNTER — Encounter: Payer: Self-pay | Admitting: *Deleted

## 2013-02-14 ENCOUNTER — Encounter: Payer: Self-pay | Admitting: Internal Medicine

## 2013-02-14 ENCOUNTER — Ambulatory Visit (INDEPENDENT_AMBULATORY_CARE_PROVIDER_SITE_OTHER): Payer: BC Managed Care – PPO | Admitting: Internal Medicine

## 2013-02-14 VITALS — BP 118/84 | HR 96 | Temp 98.8°F | Wt 104.2 lb

## 2013-02-14 DIAGNOSIS — J209 Acute bronchitis, unspecified: Secondary | ICD-10-CM

## 2013-02-14 MED ORDER — HYDROCODONE-HOMATROPINE 5-1.5 MG/5ML PO SYRP: 5.0000 mL | ORAL_SOLUTION | Freq: Three times a day (TID) | ORAL | Status: DC | PRN

## 2013-02-14 MED ORDER — AZITHROMYCIN 250 MG PO TABS: ORAL_TABLET | ORAL | Status: DC

## 2013-02-14 NOTE — Progress Notes (Signed)
HPI  Pt presents to the clinic today with c/o cough, chest congestion and laryngitis. This started about 6 weeks ago. She thought it was allergies. She started Claritin OTC. This did seems to help. Her symptoms have gotten much worse since Monday. The cough is productive of thick yellow sputum. The cough is worse at night. She has had some low grade fevers. She has taken Mucinex and ibuprofen OTC. She does get bronchitis every time this year. She has not had sick contacts. She has no history of allergies or asthma that she is aware of.  Review of Systems      Past Medical History  Diagnosis Date  . Coronary vasospasm     Dr. Myrtis Ser  . Osteoporosis   . Vitamin D deficiency   . Strabismus     Family History  Problem Relation Age of Onset  . Hypertension Mother   . Diabetes Mother   . Obesity Mother     History   Social History  . Marital Status: Married    Spouse Name: N/A    Number of Children: 2  . Years of Education: N/A   Occupational History  . 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 Smoking status: Never Smoker   . Smokeless tobacco: Not on file  . Alcohol Use: No  . Drug Use: No  . Sexual Activity:    Other Topics Concern  . Not on file   Social History Narrative   RN faculty at [employer]    No Known Allergies   Constitutional: Positive headache, fatigue and fever. Denies abrupt weight changes.  HEENT:  Positive sore throat. Denies eye redness, eye pain, pressure behind the eyes, facial pain, nasal congestion, ear pain, ringing in the ears, wax buildup, runny nose or bloody nose. Respiratory: Positive cough. Denies difficulty breathing or shortness of breath.  Cardiovascular: Denies chest pain, chest tightness, palpitations or swelling in the hands or feet.   No other specific complaints in a complete review of systems (except as listed in HPI above).  Objective:   BP 118/84  Pulse 96  Temp(Src) 98.8 F (37.1 C) (Oral)  Wt 104 lb 4  oz (47.287 kg)  SpO2 97% Wt Readings from Last 3 Encounters:  02/14/13 104 lb 4 oz (47.287 kg)  05/25/12 194 lb (87.998 kg)  11/26/11 193 lb (87.544 kg)     General: Appears her stated age, obese but well developed, well nourished in NAD. HEENT: Head: normal shape and size; Eyes: sclera white, no icterus, conjunctiva pink, PERRLA and EOMs intact; Ears: Tm's gray and intact, normal light reflex; Nose: mucosa pink and moist, septum midline; Throat/Mouth: + PND. Teeth present, mucosa erythematous and moist, no exudate noted, no lesions or ulcerations noted.  Neck: Mild cervical lymphadenopathy. Neck supple, trachea midline. No massses, lumps or thyromegaly present.  Cardiovascular: Normal rate and rhythm. S1,S2 noted.  No murmur, rubs or gallops noted. No JVD or BLE edema. No carotid bruits noted. Pulmonary/Chest: Normal effort and scattered rhonchi in RL. No respiratory distress. No wheezes, rales or ronchi noted.      Assessment & Plan:   Acute Bronchitis:  Get some rest and drink plenty of water Do salt water gargles for the sore throat eRx for Azithromax x 5 days eRx for Hycodan cough syrup  RTC as needed or if symptoms persist.

## 2013-02-14 NOTE — Patient Instructions (Signed)
Acute Bronchitis Bronchitis is inflammation of the airways that extend from the windpipe into the lungs (bronchi). The inflammation often causes mucus to develop. This leads to a cough, which is the most common symptom of bronchitis.  In acute bronchitis, the condition usually develops suddenly and goes away over time, usually in a couple weeks. Smoking, allergies, and asthma can make bronchitis worse. Repeated episodes of bronchitis may cause further lung problems.  CAUSES Acute bronchitis is most often caused by the same virus that causes a cold. The virus can spread from person to person (contagious).  SIGNS AND SYMPTOMS   Cough.   Fever.   Coughing up mucus.   Body aches.   Chest congestion.   Chills.   Shortness of breath.   Sore throat.  DIAGNOSIS  Acute bronchitis is usually diagnosed through a physical exam. Tests, such as chest X-rays, are sometimes done to rule out other conditions.  TREATMENT  Acute bronchitis usually goes away in a couple weeks. Often times, no medical treatment is necessary. Medicines are sometimes given for relief of fever or cough. Antibiotics are usually not needed but may be prescribed in certain situations. In some cases, an inhaler may be recommended to help reduce shortness of breath and control the cough. A cool mist vaporizer may also be used to help thin bronchial secretions and make it easier to clear the chest.  HOME CARE INSTRUCTIONS  Get plenty of rest.   Drink enough fluids to keep your urine clear or pale yellow (unless you have a medical condition that requires fluid restriction). Increasing fluids may help thin your secretions and will prevent dehydration.   Only take over-the-counter or prescription medicines as directed by your health care provider.   Avoid smoking and secondhand smoke. Exposure to cigarette smoke or irritating chemicals will make bronchitis worse. If you are a smoker, consider using nicotine gum or skin  patches to help control withdrawal symptoms. Quitting smoking will help your lungs heal faster.   Reduce the chances of another bout of acute bronchitis by washing your hands frequently, avoiding people with cold symptoms, and trying not to touch your hands to your mouth, nose, or eyes.   Follow up with your health care provider as directed.  SEEK MEDICAL CARE IF: Your symptoms do not improve after 1 week of treatment.  SEEK IMMEDIATE MEDICAL CARE IF:  You develop an increased fever or chills.   You have chest pain.   You have severe shortness of breath.  You have bloody sputum.   You develop dehydration.  You develop fainting.  You develop repeated vomiting.  You develop a severe headache. MAKE SURE YOU:   Understand these instructions.  Will watch your condition.  Will get help right away if you are not doing well or get worse. Document Released: 04/29/2004 Document Revised: 11/22/2012 Document Reviewed: 09/12/2012 [employer] Patient Information 2014 [employer].  

## 2013-06-25 ENCOUNTER — Ambulatory Visit: Payer: BC Managed Care – PPO | Admitting: Family Medicine

## 2013-06-28 ENCOUNTER — Encounter: Payer: Self-pay | Admitting: Family Medicine

## 2013-06-28 ENCOUNTER — Ambulatory Visit (INDEPENDENT_AMBULATORY_CARE_PROVIDER_SITE_OTHER): Payer: BC Managed Care – PPO | Admitting: Family Medicine

## 2013-06-28 VITALS — BP 136/84 | HR 76 | Temp 98.0°F | Ht <= 58 in | Wt 201.5 lb

## 2013-06-28 DIAGNOSIS — R079 Chest pain, unspecified: Secondary | ICD-10-CM

## 2013-06-28 DIAGNOSIS — I998 Other disorder of circulatory system: Secondary | ICD-10-CM | POA: Insufficient documentation

## 2013-06-28 DIAGNOSIS — R5381 Other malaise: Secondary | ICD-10-CM

## 2013-06-28 DIAGNOSIS — R5383 Other fatigue: Secondary | ICD-10-CM

## 2013-06-28 DIAGNOSIS — R6889 Other general symptoms and signs: Secondary | ICD-10-CM

## 2013-06-28 DIAGNOSIS — I209 Angina pectoris, unspecified: Secondary | ICD-10-CM

## 2013-06-28 LAB — CBC WITH DIFFERENTIAL/PLATELET
BASOS ABS: 0 10*3/uL (ref 0.0–0.1)
Basophils Relative: 0.3 % (ref 0.0–3.0)
Eosinophils Absolute: 0.1 10*3/uL (ref 0.0–0.7)
Eosinophils Relative: 0.7 % (ref 0.0–5.0)
HCT: 40.4 % (ref 36.0–46.0)
Hemoglobin: 13.6 g/dL (ref 12.0–15.0)
LYMPHS PCT: 51.4 % — AB (ref 12.0–46.0)
Lymphs Abs: 5.2 10*3/uL — ABNORMAL HIGH (ref 0.7–4.0)
MCHC: 33.6 g/dL (ref 30.0–36.0)
MCV: 93.6 fl (ref 78.0–100.0)
MONOS PCT: 6.9 % (ref 3.0–12.0)
Monocytes Absolute: 0.7 10*3/uL (ref 0.1–1.0)
NEUTROS PCT: 40.7 % — AB (ref 43.0–77.0)
Neutro Abs: 4.1 10*3/uL (ref 1.4–7.7)
PLATELETS: 362 10*3/uL (ref 150.0–400.0)
RBC: 4.32 Mil/uL (ref 3.87–5.11)
RDW: 13.7 % (ref 11.5–14.6)
WBC: 10.1 10*3/uL (ref 4.5–10.5)

## 2013-06-28 LAB — COMPREHENSIVE METABOLIC PANEL
ALBUMIN: 4.7 g/dL (ref 3.5–5.2)
ALK PHOS: 83 U/L (ref 39–117)
ALT: 28 U/L (ref 0–35)
AST: 30 U/L (ref 0–37)
BUN: 10 mg/dL (ref 6–23)
CALCIUM: 9.2 mg/dL (ref 8.4–10.5)
CO2: 29 mEq/L (ref 19–32)
Chloride: 102 mEq/L (ref 96–112)
Creatinine, Ser: 0.7 mg/dL (ref 0.4–1.2)
GFR: 91.9 mL/min (ref >60.00)
Glucose, Bld: 89 mg/dL (ref 70–99)
POTASSIUM: 3.7 meq/L (ref 3.5–5.1)
Sodium: 140 mEq/L (ref 135–145)
Total Bilirubin: 0.4 mg/dL (ref 0.3–1.2)
Total Protein: 7.2 g/dL (ref 6.0–8.3)

## 2013-06-28 LAB — TSH: TSH: 1.49 u[IU]/mL (ref 0.35–5.50)

## 2013-06-28 LAB — BRAIN NATRIURETIC PEPTIDE: PRO B NATRI PEPTIDE: 7 pg/mL (ref 0.0–100.0)

## 2013-06-28 MED ORDER — NYSTATIN 100000 UNIT/GM EX POWD: Freq: Two times a day (BID) | CUTANEOUS | Status: DC

## 2013-06-28 NOTE — Assessment & Plan Note (Signed)
EKG reassuring. Plan as stated above.

## 2013-06-28 NOTE — Assessment & Plan Note (Signed)
Will not start antihypertensive given her range of BP- I would not want to cause orthostatic hypotension. She will continue to monitor.

## 2013-06-28 NOTE — Patient Instructions (Signed)
I will call you with your lab results. Keep documenting your chest pain and blood pressures.  Please stop by to see [name] [name] on your way out to set up your echo.  If anything worsens, please call me or Dr. Ron Parker immediately.  Go to ER if after hours.

## 2013-06-28 NOTE — Assessment & Plan Note (Signed)
Likely multifactorial but will check labs today to rule out several possible contributing factors. Given cardiac history, I would like her to follow up with cards. EKG NSR today which was reassuring.  Will order 2 decho for further evaluation of heart structure and function.  Orders Placed This Encounter  Procedures  . Brain natriuretic peptide  . CBC with Differential  . TSH  . Comprehensive metabolic panel  . EKG 12-Lead  . 2D Echocardiogram without contrast

## 2013-06-28 NOTE — Assessment & Plan Note (Signed)
She is aware that her weight is playing a roll.  She is actively working on her diet.

## 2013-06-28 NOTE — Progress Notes (Signed)
Subjective:   Patient ID: [name] [name], female    DOB: May 13, 1950, 63 y.o.   MRN: [id number]  [name] [name] is a pleasant 63 y.o. year old female who presents to clinic today with Hypertension  on 06/28/2013  HPI: H/o coronary vasospasm but has not seen her cardiologist (Dr. Ron Parker) in several years.  She has been having more chest tightness, pain, SOB and fluctuating blood pressures. 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  No CP with exertion. No DOE.  No HA or dizziness.  Has felt tired- abnormally.  Last EKG done here was 04/13/11 and essentially normal with PACs.  Patient Active Problem List   Diagnosis Date Noted  . Fluctuating blood pressure 06/28/2013  . Other malaise and fatigue 06/28/2013  . Morbid obesity 06/28/2013  . Chest pain 06/28/2013  . CORONARY ARTERY SPASM 10/17/2009  . OSTEOPENIA 10/17/2009   Past Medical History  Diagnosis Date  . Coronary vasospasm     Dr. Ron Parker  . Osteoporosis   . Vitamin D deficiency   . Strabismus    Past Surgical History  Procedure Laterality Date  . Appendectomy    . Abdominal hysterectomy    . Tonsillectomy    . Strabismus repair     History  Substance Use Topics  . Smoking status: Never Smoker   . Smokeless tobacco: Not on file  . Alcohol Use: No   Family History  Problem Relation Age of Onset  . Hypertension Mother   . Diabetes Mother   . Obesity Mother    No Known Allergies Current Outpatient Prescriptions on File Prior to Visit  Medication Sig Dispense Refill  . aspirin (ASPIRIN LOW DOSE) 81 MG tablet Take 81 mg by mouth daily.        . Calcium Carbonate (CALCIUM 500 PO) Take 2 capsules by mouth daily.       Marland Kitchen docusate sodium (COLACE) 100 MG capsule Take 100 mg by mouth 3 (three) times daily as needed.        Marland Kitchen ibuprofen (ADVIL,MOTRIN) 200 MG tablet Take 200 mg by mouth every 6 (six) hours as needed.        . Multiple Vitamin  (MULTIVITAMIN) capsule Take 1 capsule by mouth daily.        . naproxen sodium (ANAPROX) 220 MG tablet Take 440mg  to 880mg  as needed        No current facility-administered medications on file prior to visit.   The PMH, PSH, Social History, Family History, Medications, and allergies have been reviewed in [facility], and have been updated if relevant.   Review of Systems See HPI No dizziness No presyncope Denies increased anxiety     Objective:    BP 136/84  Pulse 76  Temp(Src) 98 F (36.7 C) (Oral)  Ht 4\' 10"  (1.473 m)  Wt 201 lb 8 oz (91.4 kg)  BMI 42.13 kg/m2  SpO2 96% Wt Readings from Last 3 Encounters:  06/28/13 201 lb 8 oz (91.4 kg)  02/14/13 104 lb 4 oz (47.287 kg)  05/25/12 194 lb (87.998 kg)     Physical Exam  Gen:  Obese, pleasant, NAD Resp:  CTA bilaterally CVS:  RRR Ext: trace edema bilaterally      Assessment & Plan:   CORONARY ARTERY SPASM - Plan: Brain natriuretic peptide, CBC with Differential, TSH, Comprehensive metabolic panel, 2D Echocardiogram without contrast  Fluctuating blood pressure - Plan: Brain natriuretic  peptide, CBC with Differential, TSH, Comprehensive metabolic panel, 2D Echocardiogram without contrast  Other malaise and fatigue - Plan: Brain natriuretic peptide, CBC with Differential, TSH, Comprehensive metabolic panel, 2D Echocardiogram without contrast  Morbid obesity  Chest pain No Follow-up on file.

## 2013-06-28 NOTE — Progress Notes (Signed)
Pre visit review using our clinic review tool, if applicable. No additional management support is needed unless otherwise documented below in the visit note. 

## 2013-06-29 ENCOUNTER — Encounter: Payer: Self-pay | Admitting: Family Medicine

## 2013-07-02 ENCOUNTER — Other Ambulatory Visit: Payer: Self-pay

## 2013-07-02 ENCOUNTER — Encounter: Payer: Self-pay | Admitting: Cardiology

## 2013-07-02 ENCOUNTER — Other Ambulatory Visit (INDEPENDENT_AMBULATORY_CARE_PROVIDER_SITE_OTHER): Payer: BC Managed Care – PPO

## 2013-07-02 DIAGNOSIS — R0602 Shortness of breath: Secondary | ICD-10-CM

## 2013-07-02 DIAGNOSIS — R5383 Other fatigue: Secondary | ICD-10-CM

## 2013-07-02 DIAGNOSIS — I209 Angina pectoris, unspecified: Secondary | ICD-10-CM

## 2013-07-02 DIAGNOSIS — I998 Other disorder of circulatory system: Secondary | ICD-10-CM

## 2013-07-02 DIAGNOSIS — R079 Chest pain, unspecified: Secondary | ICD-10-CM

## 2013-07-02 DIAGNOSIS — R5381 Other malaise: Secondary | ICD-10-CM

## 2013-07-03 ENCOUNTER — Encounter: Payer: Self-pay | Admitting: Family Medicine

## 2013-07-05 ENCOUNTER — Encounter: Payer: Self-pay | Admitting: Cardiology

## 2013-07-05 ENCOUNTER — Ambulatory Visit (INDEPENDENT_AMBULATORY_CARE_PROVIDER_SITE_OTHER): Payer: BC Managed Care – PPO | Admitting: Cardiology

## 2013-07-05 DIAGNOSIS — I209 Angina pectoris, unspecified: Secondary | ICD-10-CM

## 2013-07-05 DIAGNOSIS — R0789 Other chest pain: Secondary | ICD-10-CM

## 2013-07-05 MED ORDER — NITROGLYCERIN 0.4 MG SL SUBL: 0.4000 mg | SUBLINGUAL_TABLET | SUBLINGUAL | Status: DC | PRN

## 2013-07-05 NOTE — Patient Instructions (Signed)
Continue your efforts at weight loss and exercise  We will send a script for Ntg to use prn  I will see you as needed.

## 2013-07-06 DIAGNOSIS — R0789 Other chest pain: Secondary | ICD-10-CM | POA: Insufficient documentation

## 2013-07-06 NOTE — Progress Notes (Signed)
[name] Date of Birth: September 25, 1950 Medical Record #[id number]  History of Present Illness: Mrs. Jenning is seen at the request of Dr. Deborra Medina for evaluation of chest pain. 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 She has a history of chest pain in the past. She had a normal cardiolite study. Cardiac cath in 2002 demonstrated some spasm of the ostium of the RCA that resolved. Otherwise normal coronary anatomy. She was treated with norvasc for a number of years then stopped it . Over the past several months she has noted occ. Sharp sternal chest pain that is brief and comes and goes. Lasts less than 10 seconds. Not related to exertion or known stressors. Only a couple of times she had some pressure associated with this. She has gained 60 lbs over the last 10 years and 20 lbs over the past year. She thinks her weight is the biggest problem and she is starting significant dietary modifications and plans on getting into an exercise regimen.   Current Outpatient Prescriptions on File Prior to Visit  Medication Sig Dispense Refill  . aspirin (ASPIRIN LOW DOSE) 81 MG tablet Take 81 mg by mouth daily.        . Calcium Carbonate (CALCIUM 500 PO) Take 2 capsules by mouth daily.       Marland Kitchen docusate sodium (COLACE) 100 MG capsule Take 100 mg by mouth 3 (three) times daily as needed.        Marland Kitchen ibuprofen (ADVIL,MOTRIN) 200 MG tablet Take 200 mg by mouth every 6 (six) hours as needed.        . Multiple Vitamin (MULTIVITAMIN) capsule Take 1 capsule by mouth daily.        . naproxen sodium (ANAPROX) 220 MG tablet Take 440mg  to 880mg  as needed       . nystatin (MYCOSTATIN) powder Apply topically 2 (two) times daily.  60 g  1   No current facility-administered medications on file prior to visit.    No Known Allergies  Past Medical History  Diagnosis Date  . Coronary vasospasm     Dr. Ron Parker  . Osteoporosis   . Vitamin D deficiency   . Strabismus     Past  Surgical History  Procedure Laterality Date  . Appendectomy    . Abdominal hysterectomy    . Tonsillectomy    . Strabismus repair      History  Smoking status  . Never Smoker   Smokeless tobacco  . Not on file    History  Alcohol Use No    Family History  Problem Relation Age of Onset  . Hypertension Mother   . Diabetes Mother   . Obesity Mother   . Heart disease Mother     Review of Systems: As noted in HPI.  All other systems were reviewed and are negative.  Physical Exam: BP 140/90  Pulse 105  Ht 4\' 10"  (1.473 m)  Wt 199 lb 12 oz (90.606 kg)  BMI 41.76 kg/m2  SpO2 98% She is a pleasant, obese WF in NAD. HEENT: [place]/AT, PERRL, sclera are clear. Oropharynx is clear. Neck: no JVD, adenopathy, bruits, thyromegaly Lungs: clear CV: RRR, normal S1-2, normal PMI. No gallop or murmur. Abd: morbidly obese. Soft, NT Ext: no edema. Pulses 2+ Neuro: nonfocal Skin: warm and dry.  LABORATORY DATA: Lab Results  Component Value Date   WBC 10.1 06/28/2013   HGB 13.6 06/28/2013   HCT 40.4 06/28/2013  PLT 362.0 06/28/2013   GLUCOSE 89 06/28/2013   CHOL 218* 04/13/2011   TRIG 103.0 04/13/2011   HDL 57.30 04/13/2011   LDLDIRECT 134.4 04/13/2011   LDLCALC 112* 01/21/2010   ALT 28 06/28/2013   AST 30 06/28/2013   NA 140 06/28/2013   K 3.7 06/28/2013   CL 102 06/28/2013   CREATININE 0.7 06/28/2013   BUN 10 06/28/2013   CO2 29 06/28/2013   TSH 1.49 06/28/2013   Ecg: NSR rate 77 bpm, normal.  Echo: Study Conclusions  Left ventricle: The cavity size was normal. Wall thickness was normal. Systolic function was normal. The estimated ejection fraction was in the range of 60% to 65%. Wall motion was normal; there were no regional wall motion abnormalities.    Assessment / Plan: 1. Atypical chest pain. For the most part her symptoms last less than 10 seconds and is sharp in character. This is not consistent with angina or coronary spasm. We discussed he history of coronary spasm. The  finding of catheter induced spasm in the ostium of the RCA is a normal phenomena and does not indicate that she has coronary spasm. I think it is unlikely that she ever did have vasospastic coronary disease. Recent metabolic work up, Ecg, and Echo are normal. I do not think any other cardiac workup is necessary. I have encouraged her goals of weight loss and regular aerobic exercise. In the event she has significant chest pain in the future I have given her a Rx for sl Ntg. If her symptoms worsen I would be happy to see her back.

## 2013-08-14 ENCOUNTER — Other Ambulatory Visit (HOSPITAL_COMMUNITY)
Admission: RE | Admit: 2013-08-14 | Discharge: 2013-08-14 | Disposition: A | Discharge status: Home / Self Care | Payer: BC Managed Care – PPO | Source: Ambulatory Visit | Attending: Family Medicine | Admitting: Family Medicine

## 2013-08-14 ENCOUNTER — Ambulatory Visit (INDEPENDENT_AMBULATORY_CARE_PROVIDER_SITE_OTHER): Payer: BC Managed Care – PPO | Admitting: Family Medicine

## 2013-08-14 ENCOUNTER — Encounter: Payer: Self-pay | Admitting: Family Medicine

## 2013-08-14 VITALS — BP 118/70 | HR 85 | Temp 98.0°F | Ht 58.5 in | Wt 200.8 lb

## 2013-08-14 DIAGNOSIS — R0789 Other chest pain: Secondary | ICD-10-CM

## 2013-08-14 DIAGNOSIS — Z1151 Encounter for screening for human papillomavirus (HPV): Secondary | ICD-10-CM | POA: Insufficient documentation

## 2013-08-14 DIAGNOSIS — Z01419 Encounter for gynecological examination (general) (routine) without abnormal findings: Secondary | ICD-10-CM | POA: Insufficient documentation

## 2013-08-14 DIAGNOSIS — M899 Disorder of bone, unspecified: Secondary | ICD-10-CM

## 2013-08-14 DIAGNOSIS — Z136 Encounter for screening for cardiovascular disorders: Secondary | ICD-10-CM

## 2013-08-14 DIAGNOSIS — M949 Disorder of cartilage, unspecified: Secondary | ICD-10-CM

## 2013-08-14 DIAGNOSIS — Z0000 Encounter for general adult medical examination without abnormal findings: Secondary | ICD-10-CM

## 2013-08-14 LAB — COMPREHENSIVE METABOLIC PANEL
ALK PHOS: 78 U/L (ref 39–117)
ALT: 19 U/L (ref 0–35)
AST: 25 U/L (ref 0–37)
Albumin: 4.2 g/dL (ref 3.5–5.2)
BUN: 12 mg/dL (ref 6–23)
CALCIUM: 9 mg/dL (ref 8.4–10.5)
CO2: 28 meq/L (ref 19–32)
Chloride: 105 mEq/L (ref 96–112)
Creatinine, Ser: 0.7 mg/dL (ref 0.4–1.2)
GFR: 93.42 mL/min (ref >60.00)
Glucose, Bld: 89 mg/dL (ref 70–99)
Potassium: 4.4 mEq/L (ref 3.5–5.1)
SODIUM: 141 meq/L (ref 135–145)
TOTAL PROTEIN: 6.8 g/dL (ref 6.0–8.3)
Total Bilirubin: 0.4 mg/dL (ref 0.2–1.2)

## 2013-08-14 LAB — CBC WITH DIFFERENTIAL/PLATELET
Basophils Absolute: 0 10*3/uL (ref 0.0–0.1)
Basophils Relative: 0.2 % (ref 0.0–3.0)
EOS PCT: 1.5 % (ref 0.0–5.0)
Eosinophils Absolute: 0.1 10*3/uL (ref 0.0–0.7)
HCT: 39.8 % (ref 36.0–46.0)
Hemoglobin: 13.4 g/dL (ref 12.0–15.0)
Lymphocytes Relative: 48.7 % — ABNORMAL HIGH (ref 12.0–46.0)
Lymphs Abs: 4 10*3/uL (ref 0.7–4.0)
MCHC: 33.8 g/dL (ref 30.0–36.0)
MCV: 93.5 fl (ref 78.0–100.0)
MONO ABS: 0.7 10*3/uL (ref 0.1–1.0)
Monocytes Relative: 8.6 % (ref 3.0–12.0)
Neutro Abs: 3.4 10*3/uL (ref 1.4–7.7)
Neutrophils Relative %: 41 % — ABNORMAL LOW (ref 43.0–77.0)
PLATELETS: 335 10*3/uL (ref 150.0–400.0)
RBC: 4.25 Mil/uL (ref 3.87–5.11)
RDW: 13.7 % (ref 11.5–15.5)
WBC: 8.2 10*3/uL (ref 4.0–10.5)

## 2013-08-14 LAB — LIPID PANEL
CHOLESTEROL: 201 mg/dL — AB (ref 0–200)
HDL: 48.5 mg/dL (ref >39.00)
LDL CALC: 115 mg/dL — AB (ref 0–99)
Total CHOL/HDL Ratio: 4
Triglycerides: 190 mg/dL — ABNORMAL HIGH (ref 0.0–149.0)
VLDL: 38 mg/dL (ref 0.0–40.0)

## 2013-08-14 LAB — TSH: TSH: 0.83 u[IU]/mL (ref 0.35–4.50)

## 2013-08-14 NOTE — Assessment & Plan Note (Signed)
Pap smear today. 

## 2013-08-14 NOTE — Patient Instructions (Addendum)
Check with your insurance to see if they will cover the shingles shot. Call us to set up a nurse visit.  We will call you with your bone density appointment and bariatric surgery referral.

## 2013-08-14 NOTE — Progress Notes (Signed)
Subjective:   Patient ID: [name] [name], female    DOB: 26-Jul-1950, 63 y.o.   MRN: [id number]  [name] [name] is a pleasant 63 y.o. year old female who presents to clinic today with Annual Exam  on 08/14/2013  HPI: Remote h/o hysterectomy but does still have cervix and ovaries.  Last pap smear in 2011.  No h/o abnormal pap smears.  Mammogram 10/12/12 Colonoscopy per pt 2009 [name] [name])- given 10 year recall.  Osteopenia- due for DEXA  Chest pain- saw Dr. Martinique on 07/05/13- note reviewed.  Did not feel cardiac w/u was neg. Given NTG to use as needed- has not filled it- has not needed it.  Has eliminated caffeine, pressure has improved.  Advised follow up as needed.  Lab Results  Component Value Date   CHOL 218* 04/13/2011   HDL 57.30 04/13/2011   LDLCALC 112* 01/21/2010   LDLDIRECT 134.4 04/13/2011   TRIG 103.0 04/13/2011   CHOLHDL 4 04/13/2011   Obesity- BMI now 41.24.  She is very frustrated.  Trying to work on diet for years- has tried multiple diets without success.  Admits to not being very physically active.   Patient Active Problem List   Diagnosis Date Noted  . Routine general medical examination at a health care facility 08/14/2013  . Atypical chest pain 07/06/2013  . Fluctuating blood pressure 06/28/2013  . Other malaise and fatigue 06/28/2013  . Morbid obesity 06/28/2013  . Chest pain 06/28/2013  . CORONARY ARTERY SPASM 10/17/2009  . OSTEOPENIA 10/17/2009   Past Medical History  Diagnosis Date  . Coronary vasospasm     Dr. Ron [name]  . Osteoporosis   . Vitamin D deficiency   . Strabismus    Past Surgical History  Procedure Laterality Date  . Appendectomy    . Abdominal hysterectomy    . Tonsillectomy    . Strabismus repair     History  Substance Use Topics  . Smoking status: Never Smoker   . Smokeless tobacco: Not on file  . Alcohol Use: No   Family History  Problem Relation Age of Onset  . Hypertension Mother   . Diabetes Mother   . Obesity Mother   . Heart  disease Mother    No Known Allergies Current Outpatient Prescriptions on File Prior to Visit  Medication Sig Dispense Refill  . aspirin (ASPIRIN LOW DOSE) 81 MG tablet Take 81 mg by mouth daily.        . Calcium Carbonate (CALCIUM 500 PO) Take 2 capsules by mouth daily.       Marland Kitchen docusate sodium (COLACE) 100 MG capsule Take 100 mg by mouth 3 (three) times daily as needed.        Marland Kitchen ibuprofen (ADVIL,MOTRIN) 200 MG tablet Take 200 mg by mouth every 6 (six) hours as needed.        . Multiple Vitamin (MULTIVITAMIN) capsule Take 1 capsule by mouth daily.        . naproxen sodium (ANAPROX) 220 MG tablet Take 440mg  to 880mg  as needed       . nystatin (MYCOSTATIN) powder Apply topically 2 (two) times daily.  60 g  1   No current facility-administered medications on file prior to visit.   The PMH, PSH, Social History, Family History, Medications, and allergies have been reviewed in [name], and have been updated if relevant.      Review of Systems    See HPI Patient reports no  vision/ hearing changes,anorexia, weight change, fever ,adenopathy, persistant /  recurrent hoarseness, swallowing issues, chest pain, edema,persistant / recurrent cough, hemoptysis, dyspnea(rest, exertional, paroxysmal nocturnal), gastrointestinal  bleeding (melena, rectal bleeding), abdominal pain, excessive heart burn, GU symptoms(dysuria, hematuria, pyuria, voiding/incontinence  Issues) syncope, focal weakness, severe memory loss, concerning skin lesions, depression, anxiety, abnormal bruising/bleeding, major joint swelling, breast masses or abnormal vaginal bleeding.    Objective:    BP 118/70  Pulse 85  Temp(Src) 98 F (36.7 C) (Oral)  Ht 4' 10.5" (1.486 m)  Wt 200 lb 12 oz (91.06 kg)  BMI 41.24 kg/m2  SpO2 97%  Wt Readings from Last 3 Encounters:  08/14/13 200 lb 12 oz (91.06 kg)  07/05/13 199 lb 12 oz (90.606 kg)  06/28/13 201 lb 8 oz (91.4 kg)     Physical Exam   General:   Well-developed,well-nourished,in no acute distress; alert,appropriate and cooperative throughout examination Head:  normocephalic and atraumatic.   Eyes:  vision grossly intact, pupils equal, pupils round, and pupils reactive to light.   Ears:  R ear normal and L ear normal.   Nose:  no external deformity.   Mouth:  good dentition.   Neck:  No deformities, masses, or tenderness noted. Breasts:  No mass, nodules, thickening, tenderness, bulging, retraction, inflamation, nipple discharge or skin changes noted.   Lungs:  Normal respiratory effort, chest expands symmetrically. Lungs are clear to auscultation, no crackles or wheezes. Heart:  Normal rate and regular rhythm. S1 and S2 normal without gallop, murmur, click, rub or other extra sounds. Abdomen:  Bowel sounds positive,abdomen soft and non-tender without masses, organomegaly or hernias noted. Rectal:  no external abnormalities.   Genitalia:  Pelvic Exam:        External: normal female genitalia without lesions or masses        Vagina: normal without lesions or masses        Cervix: normal without lesions or masses        Adnexa: normal bimanual exam without masses or fullness        Uterus: absent        Pap smear: performed Msk:  No deformity or scoliosis noted of thoracic or lumbar spine.   Extremities:  No clubbing, cyanosis, edema, or deformity noted with normal full range of motion of all joints.   Neurologic:  alert & oriented X3 and gait normal.   Skin:  Intact without suspicious lesions or rashes Cervical Nodes:  No lymphadenopathy noted Axillary Nodes:  No palpable lymphadenopathy Psych:  Cognition and judgment appear intact. Alert and cooperative with normal attention span and concentration. No apparent delusions, illusions, hallucinations       Assessment & Plan:   Routine general medical examination at a health care facility No Follow-up on file.

## 2013-08-14 NOTE — Assessment & Plan Note (Signed)
Deteriorated. Not a good candidate for appetite suppressant given h/o coronary vasospasm. Will refer to bariatric surgery.

## 2013-08-14 NOTE — Progress Notes (Signed)
Pre visit review using our clinic review tool, if applicable. No additional management support is needed unless otherwise documented below in the visit note. 

## 2013-08-14 NOTE — Addendum Note (Signed)
Addended by: Modena Nunnery on: 08/14/2013 08:09 AM   Modules accepted: Orders

## 2013-08-14 NOTE — Assessment & Plan Note (Signed)
Reviewed preventive care protocols, scheduled due services, and updated immunizations Discussed nutrition, exercise, diet, and healthy lifestyle.  Orders Placed This Encounter  Procedures  . DG Bone Density  . CBC with Differential  . Comprehensive metabolic panel  . Lipid panel  . TSH  . Vitamin D, 25-hydroxy  . Ambulatory referral to General Surgery

## 2013-08-14 NOTE — Assessment & Plan Note (Signed)
Due for dexa- order placed.

## 2013-08-15 LAB — VITAMIN D 25 HYDROXY (VIT D DEFICIENCY, FRACTURES): VIT D 25 HYDROXY: 26 ng/mL — AB (ref 30–89)

## 2013-08-16 ENCOUNTER — Encounter: Payer: Self-pay | Admitting: Family Medicine

## 2013-08-16 MED ORDER — VITAMIN D (ERGOCALCIFEROL) 1.25 MG (50000 UNIT) PO CAPS: 50000.0000 [IU] | ORAL_CAPSULE | ORAL | Status: DC

## 2013-08-16 NOTE — Telephone Encounter (Signed)
Message sent to pt at her request indicating results and instruction. Pt is to contact back with pharmacy information for Rx to be sent.

## 2013-08-16 NOTE — Addendum Note (Signed)
Addended by: Modena Nunnery on: 08/16/2013 03:01 PM   Modules accepted: Orders

## 2013-08-16 NOTE — Telephone Encounter (Signed)
Pt advised of instruction and Rx sent to requested pharmacy

## 2013-08-20 ENCOUNTER — Encounter: Payer: Self-pay | Admitting: Internal Medicine

## 2013-09-05 ENCOUNTER — Encounter (INDEPENDENT_AMBULATORY_CARE_PROVIDER_SITE_OTHER): Payer: Self-pay | Admitting: General Surgery

## 2013-09-12 ENCOUNTER — Ambulatory Visit (INDEPENDENT_AMBULATORY_CARE_PROVIDER_SITE_OTHER): Payer: BC Managed Care – PPO | Admitting: General Surgery

## 2013-09-12 ENCOUNTER — Encounter (INDEPENDENT_AMBULATORY_CARE_PROVIDER_SITE_OTHER): Payer: Self-pay | Admitting: General Surgery

## 2013-09-12 VITALS — BP 123/73 | HR 72 | Temp 98.1°F | Resp 16 | Ht <= 58 in | Wt 203.0 lb

## 2013-09-12 LAB — PROTIME-INR
INR: 1.04 (ref <1.50)
Prothrombin Time: 13.5 seconds (ref 11.6–15.2)

## 2013-09-12 LAB — VITAMIN B12: VITAMIN B 12: 538 pg/mL (ref 211–911)

## 2013-09-12 LAB — IRON: Iron: 62 ug/dL (ref 42–145)

## 2013-09-12 LAB — FOLATE

## 2013-09-12 NOTE — Patient Instructions (Signed)
Congratulations on starting your journey to a healthier life! Over the next few weeks you will be undergoing tests (x-rays and labs) and seeing specialists to help evaluate you for weight loss surgery.  These tests and consultations with a psychologist and nutritionist are needed to prepare you for the lifestyle changes that lie ahead and are often required by insurance companies to approve you for surgery.   Pathway to Surgery:  Over the next few weeks -->Lab work -->Radiology tests   - Chest x-ray - make sure your lungs are normal before surgery  - Upper GI - you drink barium and pictures are taken as it travels down your  esophagus and into your stomach - looks for reflux and a hiatal hernia which may  need to repaired at the same time as your weight loss surgery  - Abdominal Ultrasound - looks at your gallbladder and liver  - Mammogram - up to date mammogram if you are a female -->EKG  -->Sleep study - if you are felt to be at high risk for obstructive sleep apnea -->H. Pylori breath test ([employer]) - you surgeon may order this test to see if you have  a bacteria (H pylori) in your stomach which makes you at higher risk to develop a  ulcer or inflammation of your stomach -->Nutrition consultation -->Psychologist consultation -->Other specialist consults - your surgeon may determine that you need to see a  specialist like a cardiologist or pulmnologist depending on your health history -->Watch EMMI video about your planned weight loss surgery -->you can look at [email or web address] to learn more about weight loss surgery and  compare surgery outcomes -->you can look at our new website - [email or web address] - available mid-June 2015  Two weeks prior to surgery  Go on the extremely low carb liquid diet - this will decrease the size of your liver  which will make surgery safer - the nutritionist will go over this at a later date  Attend preoperative appointment with your surgeon  Attend  preoperative surgery class  One week prior to surgery  No aspirin products.  Tylenol is acceptable   24 hours prior to surgery  No alcoholic beverages  Report fever greater than 100.5 or excessive nasal drainage suggesting infection  Continue bariatric preop diet  Perform bowel prep if ordered  Do not eat or drink anything after midnight the night before surgery  Do not take any medications except those instructed by the anesthesiologist  Morning of surgery  Please arrive at the hospital at least 2 hours before your scheduled surgery time.  No makeup, fingernail polish or jewelry  Bring insurance cards with you  Bring your CPAP mask if you use this

## 2013-09-12 NOTE — Progress Notes (Signed)
Patient ID: [name] [name], female   DOB: Aug 13, 1950, 63 y.o.   MRN: [id number]  Chief Complaint  Patient presents with  . Bariatric Pre-op    HPI [name] [name] is a 63 y.o. female.   HPI 63 yo WF referred by Dr Deborra Medina for evaluation for weight loss surgery. The patient states that she has struggled with her weight for the past 10-15 years. Despite numerous attempts for sustained weight loss she has been unsuccessful. She has tried Weight Watchers numerous times, [facility], Nutrisystem, and Slim fast-all without any long-term success. She was most successful with Weight Watchers but regained all the way back plus more.   Both her mother and sister are morbidly obese. Her mother actually passed away from complications from obesity. Therefore she is very concerned about her increasing weight. She also has noticed the cause of her weight she is having more musculoskeletal pain and difficulty.  She is most interested in a sleeve gastrectomy because it does not involve as much metabolic complications as a gastric bypass. She is not interested in the lap band because of the continuing maintenance as well as the long-term cost associated with it. Past Medical History  Diagnosis Date  . Coronary vasospasm     Dr. Ron Parker  . Osteoporosis   . Vitamin D deficiency   . Strabismus   . Anemia   . Blood transfusion without reported diagnosis   . Hypertension     Past Surgical History  Procedure Laterality Date  . Appendectomy    . Abdominal hysterectomy    . Tonsillectomy    . Strabismus repair    . Repair tendons foot Right 1994  . Inner ear surgery Left 1965    Family History  Problem Relation Age of Onset  . Hypertension Mother   . Diabetes Mother   . Obesity Mother   . Heart disease Mother     Social History History  Substance Use Topics  . Smoking status: Never Smoker   . Smokeless tobacco: Not on file  . Alcohol Use: No    No Known Allergies  Current  Outpatient Prescriptions  Medication Sig Dispense Refill  . aspirin (ASPIRIN LOW DOSE) 81 MG tablet Take 81 mg by mouth daily.        . Calcium Carbonate (CALCIUM 500 PO) Take 2 capsules by mouth daily.       Marland Kitchen docusate sodium (COLACE) 100 MG capsule Take 100 mg by mouth 3 (three) times daily as needed.        Marland Kitchen ibuprofen (ADVIL,MOTRIN) 200 MG tablet Take 200 mg by mouth every 6 (six) hours as needed.        . Multiple Vitamin (MULTIVITAMIN) capsule Take 1 capsule by mouth daily.        . naproxen sodium (ANAPROX) 220 MG tablet Take 440mg  to 880mg  as needed       . nystatin (MYCOSTATIN) powder Apply topically 2 (two) times daily.  60 g  1  . Vitamin D, Ergocalciferol, (DRISDOL) 50000 UNITS CAPS capsule Take 1 capsule (50,000 Units total) by mouth every 7 (seven) days.  6 capsule  0   No current facility-administered medications for this visit.    Review of Systems Review of Systems  Constitutional: Negative for fever, activity change, appetite change and unexpected weight change.  HENT: Positive for hearing loss (left ear). Negative for nosebleeds and trouble swallowing.   Eyes: Negative for photophobia and visual disturbance.  Respiratory: Negative for chest tightness and  shortness of breath.   Cardiovascular: Negative for chest pain and leg swelling.       Denies CP, SOB, orthopnea, PND, DOE; has had atypical chest pain in the past.-Coronary spasm. Had a negative heart cath in many years ago. Recently saw a cardiologist and felt her chest pain was atypical and did not warrant additional workup.  Gastrointestinal: Positive for constipation. Negative for nausea, vomiting, abdominal pain, blood in stool and abdominal distention.       +chronic constipation; nml cscopy 2009 by Dr Carlean Purl except for diverticular disease; some occasional reflux with spicy or fried foods  Genitourinary: Negative for dysuria and difficulty urinating.       G3P2; last mammo last July; partial hysterectomy    Musculoskeletal: Negative for arthralgias.       Right knee pain; bilateral foot pain; has osteopenia on DEXA  Skin: Negative for pallor and rash.  Neurological: Negative for dizziness, seizures, facial asymmetry and numbness.       Denies TIA and amaurosis fugax   Hematological: Negative for adenopathy. Does not bruise/bleed easily.  Psychiatric/Behavioral: Negative for behavioral problems and agitation.    Blood pressure 123/73, pulse 72, temperature 98.1 F (36.7 C), temperature source Temporal, resp. rate 16, height 4\' 10"  (1.473 m), weight 203 lb (92.08 kg).  Physical Exam Physical Exam  Vitals reviewed. Constitutional: She is oriented to person, place, and time. She appears well-developed and well-nourished. No distress.  Morbidly obese. Pear shaped  HENT:  Head: Normocephalic and atraumatic.  Right Ear: External ear normal.  Left Ear: External ear normal.  Eyes: Conjunctivae are normal. No scleral icterus.  strabismus  Neck: Normal range of motion. Neck supple. No tracheal deviation present. No thyromegaly present.  Cardiovascular: Normal rate and normal heart sounds.   Pulmonary/Chest: Effort normal and breath sounds normal. No stridor. No respiratory distress. She has no wheezes.  Abdominal: Soft. She exhibits no distension. There is no tenderness. There is no rebound.    Musculoskeletal: She exhibits no edema and no tenderness.  Lymphadenopathy:    She has no cervical adenopathy.  Neurological: She is alert and oriented to person, place, and time. She exhibits normal muscle tone.  Skin: Skin is warm and dry. No rash noted. She is not diaphoretic. No erythema.  Psychiatric: She has a normal mood and affect. Her behavior is normal. Judgment and thought content normal.    Data Reviewed Dr Hulen Shouts last clinic note Dr Doug Sou office note Echo results 3/15- nml wall, nml wall motion, nml EF Labs from 08/14/13 - nml CMET, CBC, TSH; Total cholesterol 201, TG 190, LDL  115  Assessment    Morbid obesity BMI  42.4 Elevated blood pressure Osteopenia Right knee pain Bilateral foot pain Vit D deficiency Elevated Epworth Sleepiness scale score     Plan    The patient meets weight loss surgery criteria. I think the patient would be an acceptable candidate for Laparoscopic vertical sleeve gastrectomy.   We discussed laparoscopic sleeve gastrectomy. We discussed the preoperative, operative and postoperative process. Using diagrams, I explained the surgery in detail including the performance of an EGD near the end of the surgery and an Upper GI swallow study on POD 1. We discussed the typical hospital course including a 2-3 day stay baring any complications.   The patient was given educational material. I quoted the patient that most patients can lose up to 50-70% of their excess weight. We did discuss the possibility of weight regain several years after the procedure.  The risks of infection, bleeding, pain, scarring, weight regain, too little or too much weight loss, vitamin deficiencies and need for lifelong vitamin supplementation, hair loss, need for protein supplementation, leaks, stricture, reflux, food intolerance, gallstone formation, hernia, need for reoperation and conversion to roux Y gastric bypass, need for open surgery, injury to spleen or surrounding structures, DVT's, PE, and death again discussed with the patient and the patient expressed understanding and desires to proceed with laparoscopic vertical sleeve gastrectomy, possible open, intraoperative endoscopy.  We discussed that before and after surgery that there would be an alteration in their diet. I explained that we have put them on a diet 2 weeks before surgery. I also explained that they would be on a liquid diet for 2 weeks after surgery. We discussed that they would have to avoid certain foods after surgery. We discussed the importance of physical activity as well as compliance with our  dietary and supplement recommendations and routine follow-up.  I explained to the patient that we will start our evaluation process which includes labs, Upper GI to evaluate stomach and swallowing anatomy, nutritionist consultation, psychiatrist consultation, EKG, CXR, abdominal ultrasound, sleep study.  [name]. [name], MD, FACS General, Bariatric, & Minimally Invasive Surgery [facility], [place]          [facility] M 09/12/2013, 10:52 AM

## 2013-09-13 ENCOUNTER — Encounter: Payer: Self-pay | Admitting: Family Medicine

## 2013-09-13 LAB — URINALYSIS
Bilirubin Urine: NEGATIVE
Glucose, UA: NEGATIVE mg/dL
HGB URINE DIPSTICK: NEGATIVE
Ketones, ur: NEGATIVE mg/dL
LEUKOCYTES UA: NEGATIVE
Nitrite: NEGATIVE
PROTEIN: NEGATIVE mg/dL
Specific Gravity, Urine: <1.005 — ABNORMAL LOW (ref 1.005–1.030)
Urobilinogen, UA: 0.2 mg/dL (ref 0.0–1.0)
pH: 6.5 (ref 5.0–8.0)

## 2013-09-13 LAB — VITAMIN D 25 HYDROXY (VIT D DEFICIENCY, FRACTURES): Vit D, 25-Hydroxy: 38 ng/mL (ref 30–89)

## 2013-09-13 LAB — H. PYLORI ANTIBODY, IGG

## 2013-09-17 ENCOUNTER — Encounter (INDEPENDENT_AMBULATORY_CARE_PROVIDER_SITE_OTHER): Payer: Self-pay | Admitting: General Surgery

## 2013-09-17 ENCOUNTER — Other Ambulatory Visit (INDEPENDENT_AMBULATORY_CARE_PROVIDER_SITE_OTHER): Payer: Self-pay

## 2013-09-17 ENCOUNTER — Other Ambulatory Visit (INDEPENDENT_AMBULATORY_CARE_PROVIDER_SITE_OTHER): Payer: Self-pay | Admitting: General Surgery

## 2013-09-17 ENCOUNTER — Encounter: Payer: Self-pay | Admitting: Family Medicine

## 2013-09-17 ENCOUNTER — Telehealth (INDEPENDENT_AMBULATORY_CARE_PROVIDER_SITE_OTHER): Payer: Self-pay | Admitting: General Surgery

## 2013-09-17 DIAGNOSIS — A048 Other specified bacterial intestinal infections: Secondary | ICD-10-CM

## 2013-09-17 NOTE — Telephone Encounter (Signed)
Called patient to let her know that she will need to go to [place] to get her H. Pylori blood test done and she agree

## 2013-09-17 NOTE — Telephone Encounter (Signed)
Pt responded to via mychart. Requested that pt have all results faxed to our direct fax

## 2013-09-18 ENCOUNTER — Telehealth (INDEPENDENT_AMBULATORY_CARE_PROVIDER_SITE_OTHER): Payer: Self-pay | Admitting: General Surgery

## 2013-09-18 NOTE — Telephone Encounter (Signed)
Called patient to let her know that she has to go to [place] to have H. Pylori blood test was positive

## 2013-10-01 ENCOUNTER — Other Ambulatory Visit: Payer: Self-pay

## 2013-10-01 ENCOUNTER — Ambulatory Visit (HOSPITAL_COMMUNITY)
Admission: RE | Admit: 2013-10-01 | Discharge: 2013-10-01 | Disposition: A | Discharge status: Home / Self Care | Payer: BC Managed Care – PPO | Source: Ambulatory Visit | Attending: Family Medicine | Admitting: Family Medicine

## 2013-10-01 ENCOUNTER — Ambulatory Visit (HOSPITAL_COMMUNITY)
Admission: RE | Admit: 2013-10-01 | Discharge: 2013-10-01 | Disposition: A | Discharge status: Home / Self Care | Payer: BC Managed Care – PPO | Source: Ambulatory Visit | Attending: General Surgery | Admitting: General Surgery

## 2013-10-01 DIAGNOSIS — M949 Disorder of cartilage, unspecified: Principal | ICD-10-CM

## 2013-10-01 DIAGNOSIS — Z13820 Encounter for screening for osteoporosis: Secondary | ICD-10-CM | POA: Insufficient documentation

## 2013-10-01 DIAGNOSIS — M810 Age-related osteoporosis without current pathological fracture: Secondary | ICD-10-CM | POA: Insufficient documentation

## 2013-10-01 DIAGNOSIS — Z6841 Body Mass Index (BMI) 40.0 and over, adult: Secondary | ICD-10-CM | POA: Insufficient documentation

## 2013-10-01 DIAGNOSIS — I10 Essential (primary) hypertension: Secondary | ICD-10-CM | POA: Insufficient documentation

## 2013-10-01 DIAGNOSIS — D649 Anemia, unspecified: Secondary | ICD-10-CM | POA: Insufficient documentation

## 2013-10-01 DIAGNOSIS — E559 Vitamin D deficiency, unspecified: Secondary | ICD-10-CM | POA: Insufficient documentation

## 2013-10-01 DIAGNOSIS — M899 Disorder of bone, unspecified: Secondary | ICD-10-CM

## 2013-10-01 DIAGNOSIS — Z90710 Acquired absence of both cervix and uterus: Secondary | ICD-10-CM | POA: Insufficient documentation

## 2013-10-08 ENCOUNTER — Encounter (HOSPITAL_COMMUNITY)
Admission: RE | Disposition: A | Discharge status: Home / Self Care | Payer: Self-pay | Source: Ambulatory Visit | Attending: General Surgery

## 2013-10-08 ENCOUNTER — Ambulatory Visit (HOSPITAL_COMMUNITY)
Admission: RE | Admit: 2013-10-08 | Discharge: 2013-10-08 | Disposition: A | Discharge status: Home / Self Care | Payer: BC Managed Care – PPO | Source: Ambulatory Visit | Attending: General Surgery | Admitting: General Surgery

## 2013-10-08 ENCOUNTER — Telehealth: Payer: Self-pay | Admitting: *Deleted

## 2013-10-08 HISTORY — PX: BREATH TEK H PYLORI: SHX5422

## 2013-10-08 SURGERY — BREATH TEST, FOR HELICOBACTER PYLORI

## 2013-10-08 NOTE — Telephone Encounter (Signed)
Bone density results received from [facility] and have been reviewed by Dr Deborra Medina. Lm on pts vm requesting a call back. Bone density showed osteopenia. Continue weight bearing exercise and Cal+D.

## 2013-10-08 NOTE — Progress Notes (Signed)
10/08/13 [place]  Referring MD Greer Pickerel  Time of Last PO Intake 2300 (on 10/07/2013)  Baseline Breath At: 0750  Pranactin Given At: 0750  Post-Dose Breath At: 0750  Sample 1 2.5  Sample 2 1.8  Test Postive  Comments sent to Dr. [name]

## 2013-10-08 NOTE — Telephone Encounter (Signed)
Spoke to pt and informed her of results.  

## 2013-10-09 ENCOUNTER — Encounter (HOSPITAL_COMMUNITY): Payer: Self-pay | Admitting: General Surgery

## 2013-10-13 ENCOUNTER — Encounter: Payer: Self-pay | Admitting: Dietician

## 2013-10-13 ENCOUNTER — Encounter: Payer: BC Managed Care – PPO | Attending: General Surgery | Admitting: Dietician

## 2013-10-13 VITALS — Ht 58.5 in | Wt 202.8 lb

## 2013-10-13 DIAGNOSIS — Z713 Dietary counseling and surveillance: Secondary | ICD-10-CM | POA: Diagnosis not present

## 2013-10-13 DIAGNOSIS — Z01818 Encounter for other preprocedural examination: Secondary | ICD-10-CM | POA: Diagnosis not present

## 2013-10-13 NOTE — Patient Instructions (Signed)
Patient to call the [facility] to enroll in Pre-Op and Post-Op Nutrition Education when surgery date is scheduled. 

## 2013-10-13 NOTE — Progress Notes (Signed)
  Pre-Op Assessment Visit:  Pre-Operative Gastric sleeve Surgery  Medical Nutrition Therapy:  Appt start time: 1000   End time:  1062.  Patient was seen on 10/13/2013 for Pre-Operative Gastric sleeve Nutrition Assessment. Assessment and letter of approval faxed to [facility] Bariatric Surgery Program coordinator on 10/13/2013.   Preferred Learning Style:   No preference indicated   Learning Readiness:   Ready  Handouts given during visit include:  Pre-Op Goals Bariatric Surgery Protein Shakes  Teaching Method Utilized:  Visual Auditory Hands on  Barriers to learning/adherence to lifestyle change: none  Demonstrated degree of understanding via:  Teach Back   Patient to call the [facility] to enroll in Pre-Op and Post-Op Nutrition Education when surgery date is scheduled.

## 2013-10-15 ENCOUNTER — Encounter (INDEPENDENT_AMBULATORY_CARE_PROVIDER_SITE_OTHER): Payer: Self-pay | Admitting: General Surgery

## 2013-10-15 ENCOUNTER — Other Ambulatory Visit (INDEPENDENT_AMBULATORY_CARE_PROVIDER_SITE_OTHER): Payer: Self-pay | Admitting: General Surgery

## 2013-10-15 MED ORDER — AMOXICILL-CLARITHRO-LANSOPRAZ PO MISC: Freq: Two times a day (BID) | ORAL | Status: DC

## 2013-10-16 ENCOUNTER — Encounter (INDEPENDENT_AMBULATORY_CARE_PROVIDER_SITE_OTHER): Payer: Self-pay | Admitting: General Surgery

## 2013-10-19 ENCOUNTER — Encounter: Payer: Self-pay | Admitting: Family Medicine

## 2013-10-29 ENCOUNTER — Encounter (INDEPENDENT_AMBULATORY_CARE_PROVIDER_SITE_OTHER): Payer: Self-pay | Admitting: General Surgery

## 2013-10-30 ENCOUNTER — Ambulatory Visit (HOSPITAL_BASED_OUTPATIENT_CLINIC_OR_DEPARTMENT_OTHER): Payer: BC Managed Care – PPO | Attending: General Surgery | Admitting: Radiology

## 2013-10-30 VITALS — Ht 58.5 in | Wt 198.0 lb

## 2013-10-30 DIAGNOSIS — G4733 Obstructive sleep apnea (adult) (pediatric): Secondary | ICD-10-CM | POA: Insufficient documentation

## 2013-10-30 DIAGNOSIS — R0989 Other specified symptoms and signs involving the circulatory and respiratory systems: Secondary | ICD-10-CM | POA: Insufficient documentation

## 2013-10-30 DIAGNOSIS — R0609 Other forms of dyspnea: Secondary | ICD-10-CM | POA: Insufficient documentation

## 2013-11-05 ENCOUNTER — Ambulatory Visit (INDEPENDENT_AMBULATORY_CARE_PROVIDER_SITE_OTHER): Payer: BC Managed Care – PPO | Admitting: Pulmonary Disease

## 2013-11-05 ENCOUNTER — Encounter: Payer: BC Managed Care – PPO | Attending: General Surgery

## 2013-11-05 ENCOUNTER — Encounter: Payer: Self-pay | Admitting: Pulmonary Disease

## 2013-11-05 VITALS — BP 118/82 | HR 86 | Temp 98.1°F | Ht 58.5 in | Wt 198.0 lb

## 2013-11-05 DIAGNOSIS — Z713 Dietary counseling and surveillance: Secondary | ICD-10-CM | POA: Insufficient documentation

## 2013-11-05 DIAGNOSIS — Z01818 Encounter for other preprocedural examination: Secondary | ICD-10-CM | POA: Diagnosis not present

## 2013-11-05 DIAGNOSIS — G4700 Insomnia, unspecified: Secondary | ICD-10-CM | POA: Insufficient documentation

## 2013-11-05 NOTE — Progress Notes (Signed)
  Pre-Operative Nutrition Class:  Appt start time: 830   End time:  930.  Patient was seen on 11/05/2013 for Pre-Operative Bariatric Surgery Education at the [facility].   Surgery date: No date scheduled Surgery type: Sleeve Gastrectomy Start weight at [facility]: 202 lbs on 10/13/2013 Weight today: 198.0 lbs  TANITA  BODY COMP RESULTS  11/05/13   BMI (kg/m^2) 40.7   Fat Mass (lbs) 100.5   Fat Free Mass (lbs) 97.5   Total Body Water (lbs) 71.5   Samples given per MNT protocol. Patient educated on appropriate usage: Bariatric Advantage Multivitamin Orange Complete Lot # [id number] Exp: 01/2014  BariActive Calcium Citrate +D3 Lot #[id number] Exp: 09/2014  Celebrate B12 Cherry Lot # [id number] Exp: 06/2014  Premier Protein Zonia Kief Lot #[id number] Exp: 08/16/2014  Renee Pain Protein Powder Chocolate Lot # [id number] Exp: 10/206   The following the learning objectives were met by the patient during this course:  Identify Pre-Op Dietary Goals and will begin 2 weeks pre-operatively  Identify appropriate sources of fluids and proteins   State protein recommendations and appropriate sources pre and post-operatively  Identify Post-Operative Dietary Goals and will follow for 2 weeks post-operatively  Identify appropriate multivitamin and calcium sources  Describe the need for physical activity post-operatively and will follow MD recommendations  State when to call healthcare provider regarding medication questions or post-operative complications  Handouts given during class include:  Pre-Op Bariatric Surgery Diet Handout  Protein Shake Handout  Post-Op Bariatric Surgery Nutrition Handout  BELT Program Information Flyer  Support Group Information Flyer  [facility] Bariatric Supplements Price List  Follow-Up Plan: Patient will follow-up at [facility] 2 weeks post operatively for diet advancement per MD.

## 2013-11-05 NOTE — Patient Instructions (Signed)
Follow:   Pre-Op Diet per MD 2 weeks prior to surgery  Phase 2- Liquids (clear/full) 2 weeks after surgery  Vitamin/Mineral/Calcium guidelines for purchasing bariatric supplements  Exercise guidelines pre and post-op per MD  Follow-up at [facility] in 2 weeks post-op for diet advancement.  

## 2013-11-05 NOTE — Progress Notes (Signed)
Subjective:    Patient ID: [name] [name], female    DOB: [birth year], 63 y.o.   MRN: [id number]  HPI The patient is a 63 year old female who I've been asked to see for possible obstructive sleep apnea and other sleep disturbances. She has had a recent sleep study, with the results not available at this time. She apparently is going to have bariatric surgery, and has been referred for a sleep evaluation. The patient states that she has been noted to have snoring, but her husband has never commented on an abnormal breathing pattern during sleep. She does have rare gasping arousals. She has one to 2 awakenings at night, and feels that she is rested unless she has a bad night with her legs. She does note leg discomfort that does not occur in the evening or as she is going off to sleep, but rather will awaken her from sleep. It is clearly made better with movement. This does not occur every night. The patient denies any alertness issues during the day with inactivity, but does have some intermittent sleepiness in the evenings watching television or reading. She has no sleepiness with driving. The patient's weight is up 10 pounds over the last 2 years, and her Epworth score today is 8.   Sleep Questionnaire What time do you typically go to bed?( Between what hours) 10-10:30pm 10-10:30pm at 1349 on 11/05/13 by Lilli Few, CMA How long does it take you to fall asleep? 30 minutes 30 minutes at 1349 on 11/05/13 by Lilli Few, CMA How many times during the night do you wake up? 2 2 at 1349 on 11/05/13 by Lilli Few, CMA What time do you get out of bed to start your day? [phone] at 1349 on 11/05/13 by Lilli Few, CMA Do you drive or operate heavy machinery in your occupation? No No at 1349 on 11/05/13 by Lilli Few, CMA How much has your weight changed (up or down) over the past two years? (In pounds) 10 lb (4.536 kg) 10 lb (4.536 kg) at 1349 on  11/05/13 by Lilli Few, CMA Have you ever had a sleep study before? No No at 1349 on 11/05/13 by Lilli Few, CMA Do you currently use CPAP? No No at 1349 on 11/05/13 by Lilli Few, CMA Do you wear oxygen at any time? No No at 1349 on 11/05/13 by Lilli Few, CMA   Review of Systems  Constitutional: Negative for fever and unexpected weight change.  HENT: Positive for dental problem. Negative for congestion, ear pain, nosebleeds, postnasal drip, rhinorrhea, sinus pressure, sneezing, sore throat and trouble swallowing.   Eyes: Negative for redness and itching.  Respiratory: Negative for cough, chest tightness, shortness of breath and wheezing.   Cardiovascular: Negative for palpitations and leg swelling.  Gastrointestinal: Negative for nausea and vomiting.  Genitourinary: Negative for dysuria.  Musculoskeletal: Negative for joint swelling.  Skin: Negative for rash.  Neurological: Negative for headaches.  Hematological: Does not bruise/bleed easily.  Psychiatric/Behavioral: Negative for dysphoric mood. The patient is not nervous/anxious.        Objective:   Physical Exam Constitutional:  Overweight female, no acute distress  HENT:  Nares patent without discharge, but narrowed bilat  Oropharynx without exudate, palate and uvula are normal  Eyes:  Left eye deviated medially, small left pupil, right OK, no scleral icterus  Neck:  No JVD, no TMG  Cardiovascular:  Normal rate, regular rhythm, no rubs or gallops.  2/6 sem        Intact distal pulses  Pulmonary :  Normal breath sounds, no stridor or respiratory distress   No rales, rhonchi, or wheezing  Abdominal:  Soft, nondistended, bowel sounds present.  No tenderness noted.   Musculoskeletal:  No lower extremity edema noted.  Lymph Nodes:  No cervical lymphadenopathy noted  Skin:  No cyanosis noted  Neurologic:  Alert, appropriate, moves all 4 extremities without obvious  deficit.         Assessment & Plan:

## 2013-11-05 NOTE — Patient Instructions (Signed)
Will get the results of your sleep study, and call you to discuss.

## 2013-11-05 NOTE — Assessment & Plan Note (Signed)
The patient has known snoring as well as intermittent leg discomfort that can disrupt her sleep, but overall feels that she is fairly rested in the mornings upon arising. She does not have inappropriate daytime sleepiness, but does have difficulties staying awake in the evenings. It is unclear if she has sleep-disordered breathing, or some type of movement disorder of sleep. She has had a sleep study, and I will track down the results to review with her.

## 2013-11-07 DIAGNOSIS — G4730 Sleep apnea, unspecified: Secondary | ICD-10-CM

## 2013-11-07 DIAGNOSIS — G4710 Hypersomnia, unspecified: Secondary | ICD-10-CM

## 2013-11-07 NOTE — Sleep Study (Signed)
   NAME: [name] [name] DATE OF BIRTH:  Oct 14, 1950 MEDICAL RECORD NUMBER [id number]  LOCATION: [facility]  PHYSICIAN: Kathee Delton  DATE OF STUDY: 10/30/2013  SLEEP STUDY TYPE: Nocturnal Polysomnogram               REFERRING PHYSICIAN: Gayland Curry, MD  INDICATION FOR STUDY: Hypersomnia with sleep apnea  EPWORTH SLEEPINESS SCORE:  9 HEIGHT: 4' 10.5" (148.6 cm)  WEIGHT: 198 lb (89.812 kg)    Body mass index is 40.67 kg/(m^2).  NECK SIZE: 12 in.  MEDICATIONS: Reviewed in the sleep record  SLEEP ARCHITECTURE: The patient had a total sleep time of 286 minutes with very little slow-wave sleep and only 23 minutes of REM. Sleep onset latency was normal at 22 minutes, and REM onset was normal as well. Sleep efficiency was moderately reduced at 76%.  RESPIRATORY DATA: The patient was found to have one central apnea and 16 obstructive hypopneas, giving her an AHI of only 4 events per hour. The events occurred in all body positions, and there was mild snoring noted throughout.  OXYGEN DATA: There was transient oxygen desaturation as low as 86% with the patient's obstructive events.  CARDIAC DATA: No clinically significant arrhythmias were noted  MOVEMENT/PARASOMNIA: No significant periodic limb movements or abnormal behaviors were seen.  IMPRESSION/ RECOMMENDATION:    1) Small numbers of obstructive and central events which do not meet the AHI criteria for the obstructive sleep apnea syndrome. The patient should be encouraged to work aggressively on weight loss.     [place], American Board of Sleep Medicine  ELECTRONICALLY SIGNED ON:  11/07/2013, 6:25 PM [place] PH: [phone]   FX: [phone] [place]

## 2013-11-08 ENCOUNTER — Encounter (INDEPENDENT_AMBULATORY_CARE_PROVIDER_SITE_OTHER): Payer: Self-pay | Admitting: General Surgery

## 2013-11-10 ENCOUNTER — Encounter: Payer: Self-pay | Admitting: Pulmonary Disease

## 2013-11-12 ENCOUNTER — Telehealth: Payer: Self-pay | Admitting: Pulmonary Disease

## 2013-11-12 NOTE — Telephone Encounter (Signed)
Let pt know that her sleep study showed that she has a few apneas during the night, but does not have enough to classify as having sleep apnea.  Did have snoring.  Did not have leg movements during the night. Ok to proceed with your bariatric surgery, and once you lose weight, I suspect sleep with be better.

## 2013-11-12 NOTE — Telephone Encounter (Signed)
Pt advised. Jennifer Castillo, CMA  

## 2013-11-12 NOTE — Telephone Encounter (Signed)
Pt had sleep study 10/30/13. Please advise Victor thanks

## 2013-11-13 ENCOUNTER — Telehealth (INDEPENDENT_AMBULATORY_CARE_PROVIDER_SITE_OTHER): Payer: Self-pay

## 2013-11-13 NOTE — Telephone Encounter (Signed)
Pt is scheduled for repeat H Pylori breath test on 12/03/13.  She is aware to be NPO after midnight.  No antibiotics or stomach medications 2 weeks prior.

## 2013-11-15 ENCOUNTER — Encounter: Payer: Self-pay | Admitting: Family Medicine

## 2013-11-20 ENCOUNTER — Other Ambulatory Visit (INDEPENDENT_AMBULATORY_CARE_PROVIDER_SITE_OTHER): Payer: Self-pay | Admitting: General Surgery

## 2013-11-27 ENCOUNTER — Telehealth (INDEPENDENT_AMBULATORY_CARE_PROVIDER_SITE_OTHER): Payer: Self-pay

## 2013-11-27 NOTE — Telephone Encounter (Signed)
Called and spoke to patient to give pre op and post op appt dates & time.  Patient cannot come in on 11/30/13 for pre-op appt.  Appt scheduled for 11/28/13 @ 8:45 (pre-op) 12/26/13 @ 9:30 (post op)

## 2013-11-28 ENCOUNTER — Encounter (INDEPENDENT_AMBULATORY_CARE_PROVIDER_SITE_OTHER): Payer: Self-pay | Admitting: General Surgery

## 2013-11-28 ENCOUNTER — Ambulatory Visit (INDEPENDENT_AMBULATORY_CARE_PROVIDER_SITE_OTHER): Payer: BC Managed Care – PPO | Admitting: General Surgery

## 2013-11-28 VITALS — BP 132/78 | HR 78 | Temp 98.0°F | Resp 18 | Ht 59.5 in | Wt 201.0 lb

## 2013-11-28 MED ORDER — OXYCODONE HCL 5 MG/5ML PO SOLN: 5.0000 mg | ORAL | Status: DC | PRN

## 2013-11-28 MED ORDER — PANTOPRAZOLE SODIUM 40 MG PO TBEC: 40.0000 mg | DELAYED_RELEASE_TABLET | Freq: Every day | ORAL | Status: DC

## 2013-11-28 NOTE — Patient Instructions (Signed)
Get prescriptions filled  Congratulations on starting your journey to a healthier life! Over the next few weeks you will be undergoing tests (x-rays and labs) and seeing specialists to help evaluate you for weight loss surgery.  These tests and consultations with a psychologist and nutritionist are needed to prepare you for the lifestyle changes that lie ahead and are often required by insurance companies to approve you for surgery.   Pathway to Surgery:  Over the next few weeks -->Lab work -->Radiology tests   - Chest x-ray - make sure your lungs are normal before surgery  - Upper GI - you drink barium and pictures are taken as it travels down your  esophagus and into your stomach - looks for reflux and a hiatal hernia which may  need to repaired at the same time as your weight loss surgery  - Abdominal Ultrasound - looks at your gallbladder and liver  - Mammogram - up to date mammogram if you are a female -->EKG  -->Sleep study - if you are felt to be at high risk for obstructive sleep apnea -->H. Pylori breath test (BreathTek) - you surgeon may order this test to see if you have  a bacteria (H pylori) in your stomach which makes you at higher risk to develop a  ulcer or inflammation of your stomach -->Nutrition consultation -->Psychologist consultation -->Other specialist consults - your surgeon may determine that you need to see a  specialist like a cardiologist or pulmnologist depending on your health history -->Watch EMMI video about your planned weight loss surgery -->you can look at www.[email or web address] to learn more about weight loss surgery and  compare surgery outcomes -->you can look at our new website - [email or web address] - available mid-August 2015  Two weeks prior to surgery  Go on the extremely low carb liquid diet - this will decrease the size of your liver  which will make surgery safer - the nutritionist will go over this at a later date  Attend preoperative appointment with  your surgeon  Attend preoperative surgery class  One week prior to surgery  No aspirin products.  Tylenol is acceptable   24 hours prior to surgery  No alcoholic beverages  Report fever greater than 100.5 or excessive nasal drainage suggesting infection  Continue bariatric preop diet  Start clear liquid diet  Perform bowel prep if ordered  Do not eat or drink anything after midnight the night before surgery  Do not take any medications except those instructed by the anesthesiologist  Morning of surgery  Please arrive at the hospital at least 2 hours before your scheduled surgery time.  No makeup, fingernail polish or jewelry  Bring insurance cards with you  Bring your CPAP mask if you use this

## 2013-12-02 NOTE — Progress Notes (Signed)
Patient ID: [name] [name], female   DOB: 1951-01-22, 63 y.o.   MRN: [id number]  Chief Complaint  Patient presents with  . Bariatric Pre-op    sleeve sx 12/18/13    HPI [name] [name] is a 63 y.o. female.    HPI 63 yo morbidly obese WF comes in for her preop surgery appt. She has been approved for Laparoscopic Sleeve Gastrectomy on 9/15.  She denies any changes since her last visit. The only knew issue is some constipation.  Past Medical History  Diagnosis Date  . Coronary vasospasm     Dr. Ron Parker  . Osteoporosis   . Vitamin D deficiency   . Strabismus   . Anemia   . Blood transfusion without reported diagnosis   . Hypertension     Past Surgical History  Procedure Laterality Date  . Appendectomy    . Abdominal hysterectomy    . Tonsillectomy    . Strabismus repair    . Repair tendons foot Right 1994  . Inner ear surgery Left 1965  . Breath tek h pylori N/A 10/08/2013    Procedure: BREATH TEK H PYLORI;  Surgeon: Gayland Curry, MD;  Location: [facility];  Service: General;  Laterality: N/A;    Family History  Problem Relation Age of Onset  . Hypertension Mother   . Diabetes Mother   . Obesity Mother   . Heart disease Mother   . COPD Other   . Hyperlipidemia Other     Social History History  Substance Use Topics  . Smoking status: Never Smoker   . Smokeless tobacco: Not on file  . Alcohol Use: No    No Known Allergies  Current Outpatient Prescriptions  Medication Sig Dispense Refill  . aspirin (ASPIRIN LOW DOSE) 81 MG tablet Take 81 mg by mouth daily.        . Calcium Carbonate (CALCIUM 500 PO) Take 2 capsules by mouth daily.       Marland Kitchen docusate sodium (COLACE) 100 MG capsule Take 100 mg by mouth 3 (three) times daily as needed.        Marland Kitchen ibuprofen (ADVIL,MOTRIN) 200 MG tablet Take 200 mg by mouth every 6 (six) hours as needed.        . Multiple Vitamin (MULTIVITAMIN) capsule Take 1 capsule by mouth daily.        . naproxen sodium (ANAPROX) 220 MG tablet Take  440mg  to 880mg  as needed       . nystatin (MYCOSTATIN) powder Apply topically 2 (two) times daily.  60 g  1  . Vitamin D, Ergocalciferol, (DRISDOL) 50000 UNITS CAPS capsule Take 1 capsule (50,000 Units total) by mouth every 7 (seven) days.  6 capsule  0  . oxyCODONE (ROXICODONE) 5 MG/5ML solution Take 5-10 mLs (5-10 mg total) by mouth every 4 (four) hours as needed for severe pain.  200 mL  0  . pantoprazole (PROTONIX) 40 MG tablet Take 1 tablet (40 mg total) by mouth daily.  30 tablet  1   No current facility-administered medications for this visit.    Review of Systems Review of Systems  Constitutional: Negative for fever, activity change, appetite change and unexpected weight change.  HENT: Negative for nosebleeds and trouble swallowing.   Eyes: Negative for photophobia and visual disturbance.  Respiratory: Negative for chest tightness and shortness of breath.   Cardiovascular: Negative for chest pain and leg swelling.       Denies CP, SOB, orthopnea, PND, DOE  Gastrointestinal: Negative for nausea,  vomiting, abdominal pain and blood in stool.       Denies reflux  Genitourinary: Negative for dysuria and difficulty urinating.  Musculoskeletal: Negative for arthralgias.       Knee pain; foot pain  Skin: Negative for pallor and rash.  Neurological: Negative for dizziness, seizures, facial asymmetry and numbness.       Denies TIA and amaurosis fugax   Hematological: Negative for adenopathy. Does not bruise/bleed easily.  Psychiatric/Behavioral: Negative for behavioral problems and agitation.    Blood pressure 132/78, pulse 78, temperature 98 F (36.7 C), resp. rate 18, height 4' 11.5" (1.511 m), weight 201 lb (91.173 kg).  Physical Exam Physical Exam  Vitals reviewed. Constitutional: She is oriented to person, place, and time. She appears well-developed and well-nourished. No distress.  HENT:  Head: Normocephalic and atraumatic.  Right Ear: External ear normal.  Left Ear:  External ear normal.  Eyes: Conjunctivae are normal. No scleral icterus.  Neck: Normal range of motion. Neck supple. No tracheal deviation present. No thyromegaly present.  Cardiovascular: Normal rate and normal heart sounds.   Pulmonary/Chest: Effort normal and breath sounds normal. No stridor. No respiratory distress. She has no wheezes.  Abdominal: Soft. She exhibits no distension. There is no tenderness. There is no rebound.    Musculoskeletal: She exhibits no edema and no tenderness.  Lymphadenopathy:    She has no cervical adenopathy.  Neurological: She is alert and oriented to person, place, and time. She exhibits normal muscle tone.  Skin: Skin is warm and dry. No rash noted. She is not diaphoretic. No erythema.  Psychiatric: She has a normal mood and affect. Her behavior is normal. Judgment and thought content normal.    Data Reviewed My office note nml ugi nlml cxr Negative sleep study Evaluation labs - TC 201, TG 190, LDL 115 Assessment    Morbid obesity BMI 42.4  Elevated blood pressure  Osteopenia  Right knee pain  Bilateral foot pain  Vit D deficiency Dislipidemia     Plan    We reviewed her preop workup.  She was given her postop pain medication prescription today. All of her questions were asked and answered. We briefly reviewed the typical hospitalization.   [name] [name]. [name], MD, FACS General, Bariatric, & Minimally Invasive Surgery [facility] [place]        [facility] M 12/02/2013, 6:27 PM

## 2013-12-03 ENCOUNTER — Ambulatory Visit (HOSPITAL_COMMUNITY)
Admission: RE | Admit: 2013-12-03 | Discharge: 2013-12-03 | Disposition: A | Discharge status: Home / Self Care | Payer: BC Managed Care – PPO | Source: Ambulatory Visit | Attending: General Surgery | Admitting: General Surgery

## 2013-12-03 ENCOUNTER — Encounter (HOSPITAL_COMMUNITY)
Admission: RE | Disposition: A | Discharge status: Home / Self Care | Payer: Self-pay | Source: Ambulatory Visit | Attending: General Surgery

## 2013-12-03 HISTORY — PX: BREATH TEK H PYLORI: SHX5422

## 2013-12-03 SURGERY — BREATH TEST, FOR HELICOBACTER PYLORI

## 2013-12-03 NOTE — Progress Notes (Signed)
12/03/13 [place]  Referring MD Alphonsa Overall  Time of Last PO Intake 2100 (on 12/02/2013)  Baseline Breath At: 0752  Pranactin Given At: 6606  Post-Dose Breath At: 0810  Sample 1 2.1  Sample 2 2.9  Test Negative

## 2013-12-03 NOTE — OR Nursing (Signed)
Results for this patient on 10:08 was incorrect.  Correct results will be forthcoming in a few minutes.  Please disregard the first results.

## 2013-12-03 NOTE — Progress Notes (Signed)
12/03/13 [place]  Referring MD Greer Pickerel  Time of Last PO Intake 2100 (on 12/02/2013)  Baseline Breath At: 0721  Pranactin Given At: 0721  Post-Dose Breath At: 0736  Sample 1 3.3  Sample 2 2.5  Test Negative  Comments incorrect results emailed for this patient.  Please disregard.  Correct results will be forthcoming.

## 2013-12-03 NOTE — Progress Notes (Signed)
12/03/13 [place]  Referring MD Greer Pickerel  Time of Last PO Intake 2100 (on 12/02/2013)  Baseline Breath At: 0721  Pranactin Given At: 0721  Post-Dose Breath At: 0736  Sample 1 3.3  Sample 2 2.5  Test Negative  Comments These are the correct results

## 2013-12-04 ENCOUNTER — Encounter (HOSPITAL_COMMUNITY): Payer: Self-pay | Admitting: General Surgery

## 2013-12-05 ENCOUNTER — Encounter (HOSPITAL_COMMUNITY): Payer: Self-pay | Admitting: Pharmacy Technician

## 2013-12-12 ENCOUNTER — Encounter (HOSPITAL_COMMUNITY): Payer: Self-pay

## 2013-12-12 ENCOUNTER — Encounter (HOSPITAL_COMMUNITY)
Admission: RE | Admit: 2013-12-12 | Discharge: 2013-12-12 | Disposition: A | Discharge status: Home / Self Care | Payer: BC Managed Care – PPO | Source: Ambulatory Visit | Attending: General Surgery | Admitting: General Surgery

## 2013-12-12 DIAGNOSIS — Z6841 Body Mass Index (BMI) 40.0 and over, adult: Secondary | ICD-10-CM | POA: Insufficient documentation

## 2013-12-12 DIAGNOSIS — Z01812 Encounter for preprocedural laboratory examination: Secondary | ICD-10-CM | POA: Insufficient documentation

## 2013-12-12 HISTORY — DX: Reserved for inherently not codable concepts without codable children: IMO0001

## 2013-12-12 HISTORY — DX: Angina pectoris with documented spasm: I20.1

## 2013-12-12 HISTORY — DX: Allergy status to unspecified drugs, medicaments and biological substances: Z88.9

## 2013-12-12 HISTORY — DX: Other specified disorders of bone density and structure, unspecified site: M85.80

## 2013-12-12 HISTORY — DX: Unspecified osteoarthritis, unspecified site: M19.90

## 2013-12-12 HISTORY — DX: Headache: R51

## 2013-12-12 HISTORY — DX: Unspecified hearing loss, unspecified ear: H91.90

## 2013-12-12 LAB — CBC WITH DIFFERENTIAL/PLATELET
Basophils Absolute: 0 10*3/uL (ref 0.0–0.1)
Basophils Relative: 0 % (ref 0–1)
EOS PCT: 1 % (ref 0–5)
Eosinophils Absolute: 0.1 10*3/uL (ref 0.0–0.7)
HCT: 41 % (ref 36.0–46.0)
Hemoglobin: 13.7 g/dL (ref 12.0–15.0)
LYMPHS ABS: 4.3 10*3/uL — AB (ref 0.7–4.0)
LYMPHS PCT: 44 % (ref 12–46)
MCH: 31 pg (ref 26.0–34.0)
MCHC: 33.4 g/dL (ref 30.0–36.0)
MCV: 92.8 fL (ref 78.0–100.0)
Monocytes Absolute: 0.8 10*3/uL (ref 0.1–1.0)
Monocytes Relative: 8 % (ref 3–12)
NEUTROS ABS: 4.6 10*3/uL (ref 1.7–7.7)
Neutrophils Relative %: 47 % (ref 43–77)
PLATELETS: 351 10*3/uL (ref 150–400)
RBC: 4.42 MIL/uL (ref 3.87–5.11)
RDW: 13.7 % (ref 11.5–15.5)
WBC: 9.8 10*3/uL (ref 4.0–10.5)

## 2013-12-12 LAB — COMPREHENSIVE METABOLIC PANEL
ALK PHOS: 92 U/L (ref 39–117)
ALT: 22 U/L (ref 0–35)
AST: 21 U/L (ref 0–37)
Albumin: 4.2 g/dL (ref 3.5–5.2)
Anion gap: 14 (ref 5–15)
BUN: 20 mg/dL (ref 6–23)
CALCIUM: 9.7 mg/dL (ref 8.4–10.5)
CHLORIDE: 100 meq/L (ref 96–112)
CO2: 25 meq/L (ref 19–32)
Creatinine, Ser: 0.52 mg/dL (ref 0.50–1.10)
GLUCOSE: 89 mg/dL (ref 70–99)
Potassium: 4.1 mEq/L (ref 3.7–5.3)
SODIUM: 139 meq/L (ref 137–147)
Total Protein: 7.7 g/dL (ref 6.0–8.3)

## 2013-12-12 NOTE — Pre-Procedure Instructions (Signed)
EKG AND CXR REPORTS ARE IN EPIC FROM 10/01/13.  2 D ECHO REPORT IN EPIC FROM 07/02/13.

## 2013-12-12 NOTE — Patient Instructions (Signed)
YOUR SURGERY IS SCHEDULED AT [facility]  ON:  Tuesday  9/15  REPORT TO  SHORT STAY CENTER AT:  11:15 AM   PLEASE COME IN THE [place] ENTRANCE AND FOLLOW SIGNS TO SHORT STAY CENTER.  DO NOT EAT  ANYTHING AFTER MIDNIGHT THE NIGHT BEFORE YOUR SURGERY.   NO FOOD, NO CHEWING GUM, NO MINTS, NO CANDIES, NO CHEWING TOBACCO. YOU MAY HAVE SIPS OF WATER FROM MIDNIGHT UNTIL 6:00 AM DAY OF SURGERY - NOTHING TO DRINK AFTER 6 AM DAY OF SURGERY.  PLEASE TAKE THE FOLLOWING MEDICATIONS THE AM OF YOUR SURGERY WITH A FEW SIPS OF WATER:  NO MEDS TO TAKE - BUT MAY USE FLONASE   DO NOT BRING VALUABLES, MONEY, CREDIT CARDS.  DO NOT WEAR JEWELRY, MAKE-UP, NAIL POLISH AND NO METAL PINS OR CLIPS IN YOUR HAIR. CONTACT LENS, DENTURES / PARTIALS, GLASSES SHOULD NOT BE WORN TO SURGERY AND IN MOST CASES-HEARING AIDS WILL NEED TO BE REMOVED.  BRING YOUR GLASSES CASE, ANY EQUIPMENT NEEDED FOR YOUR CONTACT LENS. FOR PATIENTS ADMITTED TO THE HOSPITAL--CHECK OUT TIME THE DAY OF DISCHARGE IS 11:00 AM.  ALL INPATIENT ROOMS ARE PRIVATE - WITH BATHROOM, TELEPHONE, TELEVISION AND WIFI INTERNET.  PLEASE BE AWARE THAT YOU MAY NEED ADDITIONAL BLOOD DRAWN DAY OF YOUR SURGERY  _______________________________________________________________________   [facility] - Preparing for Surgery Before surgery, you can play an important role.  Because skin is not sterile, your skin needs to be as free of germs as possible.  You can reduce the number of germs on your skin by washing with CHG (chlorahexidine gluconate) soap before surgery.  CHG is an antiseptic cleaner which kills germs and bonds with the skin to continue killing germs even after washing. Please DO NOT use if you have an allergy to CHG or antibacterial soaps.  If your skin becomes reddened/irritated stop using the CHG and inform your nurse when you arrive at Short Stay. Do not shave (including legs and underarms) for at least 48 hours prior to the first  CHG shower.  You may shave your face/neck. Please follow these instructions carefully:  1.  Shower with CHG Soap the night before surgery and the  morning of Surgery.  2.  If you choose to wash your hair, wash your hair first as usual with your  normal  shampoo.  3.  After you shampoo, rinse your hair and body thoroughly to remove the  shampoo.                           4.  Use CHG as you would any other liquid soap.  You can apply chg directly  to the skin and wash                       Gently with a scrungie or clean washcloth.  5.  Apply the CHG Soap to your body ONLY FROM THE NECK DOWN.   Do not use on face/ open                           Wound or open sores. Avoid contact with eyes, ears mouth and genitals (private parts).                       Wash face,  Genitals (private parts) with your normal soap.  6.  Wash thoroughly, paying special attention to the area where your surgery  will be performed.  7.  Thoroughly rinse your body with warm water from the neck down.  8.  DO NOT shower/wash with your normal soap after using and rinsing off  the CHG Soap.                9.  Pat yourself dry with a clean towel.            10.  Wear clean pajamas.            11.  Place clean sheets on your bed the night of your first shower and do not  sleep with pets. Day of Surgery : Do not apply any lotions/deodorants the morning of surgery.  Please wear clean clothes to the hospital/surgery center.  FAILURE TO FOLLOW THESE INSTRUCTIONS MAY RESULT IN THE CANCELLATION OF YOUR SURGERY PATIENT SIGNATURE_________________________________  NURSE SIGNATURE__________________________________  ________________________________________________________________________

## 2013-12-18 ENCOUNTER — Inpatient Hospital Stay (HOSPITAL_COMMUNITY)
Admission: RE | Admit: 2013-12-18 | Discharge: 2013-12-20 | DRG: 621 | Disposition: A | Discharge status: Home / Self Care | Payer: BC Managed Care – PPO | Source: Ambulatory Visit | Attending: General Surgery | Admitting: General Surgery

## 2013-12-18 ENCOUNTER — Inpatient Hospital Stay (HOSPITAL_COMMUNITY): Payer: BC Managed Care – PPO | Admitting: Anesthesiology

## 2013-12-18 ENCOUNTER — Encounter (HOSPITAL_COMMUNITY): Payer: BC Managed Care – PPO | Admitting: Anesthesiology

## 2013-12-18 ENCOUNTER — Encounter (HOSPITAL_COMMUNITY): Payer: Self-pay | Admitting: *Deleted

## 2013-12-18 ENCOUNTER — Encounter (HOSPITAL_COMMUNITY)
Admission: RE | Disposition: A | Discharge status: Home / Self Care | Payer: Self-pay | Source: Ambulatory Visit | Attending: General Surgery

## 2013-12-18 DIAGNOSIS — Z01812 Encounter for preprocedural laboratory examination: Secondary | ICD-10-CM | POA: Diagnosis not present

## 2013-12-18 DIAGNOSIS — Z6841 Body Mass Index (BMI) 40.0 and over, adult: Secondary | ICD-10-CM | POA: Diagnosis not present

## 2013-12-18 DIAGNOSIS — M810 Age-related osteoporosis without current pathological fracture: Secondary | ICD-10-CM | POA: Diagnosis present

## 2013-12-18 DIAGNOSIS — Z836 Family history of other diseases of the respiratory system: Secondary | ICD-10-CM | POA: Diagnosis not present

## 2013-12-18 DIAGNOSIS — Z791 Long term (current) use of non-steroidal anti-inflammatories (NSAID): Secondary | ICD-10-CM

## 2013-12-18 DIAGNOSIS — I10 Essential (primary) hypertension: Secondary | ICD-10-CM | POA: Diagnosis present

## 2013-12-18 DIAGNOSIS — M899 Disorder of bone, unspecified: Secondary | ICD-10-CM | POA: Diagnosis present

## 2013-12-18 DIAGNOSIS — Z9884 Bariatric surgery status: Secondary | ICD-10-CM

## 2013-12-18 DIAGNOSIS — Z8349 Family history of other endocrine, nutritional and metabolic diseases: Secondary | ICD-10-CM

## 2013-12-18 DIAGNOSIS — Z7982 Long term (current) use of aspirin: Secondary | ICD-10-CM | POA: Diagnosis not present

## 2013-12-18 DIAGNOSIS — Z8249 Family history of ischemic heart disease and other diseases of the circulatory system: Secondary | ICD-10-CM | POA: Diagnosis not present

## 2013-12-18 DIAGNOSIS — Z833 Family history of diabetes mellitus: Secondary | ICD-10-CM

## 2013-12-18 DIAGNOSIS — Z79899 Other long term (current) drug therapy: Secondary | ICD-10-CM | POA: Diagnosis not present

## 2013-12-18 DIAGNOSIS — E559 Vitamin D deficiency, unspecified: Secondary | ICD-10-CM | POA: Diagnosis present

## 2013-12-18 DIAGNOSIS — K5900 Constipation, unspecified: Secondary | ICD-10-CM | POA: Diagnosis present

## 2013-12-18 DIAGNOSIS — M949 Disorder of cartilage, unspecified: Secondary | ICD-10-CM

## 2013-12-18 HISTORY — PX: LAPAROSCOPIC GASTRIC SLEEVE RESECTION: SHX5895

## 2013-12-18 LAB — HEMOGLOBIN AND HEMATOCRIT, BLOOD
HCT: 40.6 % (ref 36.0–46.0)
Hemoglobin: 13.4 g/dL (ref 12.0–15.0)

## 2013-12-18 SURGERY — GASTRECTOMY, SLEEVE, LAPAROSCOPIC
Anesthesia: General | Site: Abdomen

## 2013-12-18 MED ORDER — PROMETHAZINE HCL 25 MG/ML IJ SOLN
12.5000 mg | Freq: Four times a day (QID) | INTRAMUSCULAR | Status: DC | PRN
  Administered 2013-12-19: 12.5 mg via INTRAVENOUS
  Filled 2013-12-18: qty 1

## 2013-12-18 MED ORDER — ROCURONIUM BROMIDE 100 MG/10ML IV SOLN
INTRAVENOUS | Status: AC
  Filled 2013-12-18: qty 1

## 2013-12-18 MED ORDER — UNJURY CHICKEN SOUP POWDER: 2.0000 [oz_av] | Freq: Four times a day (QID) | ORAL | Status: DC

## 2013-12-18 MED ORDER — PROMETHAZINE HCL 25 MG/ML IJ SOLN: 6.2500 mg | INTRAMUSCULAR | Status: DC | PRN

## 2013-12-18 MED ORDER — HYDROMORPHONE HCL PF 1 MG/ML IJ SOLN
INTRAMUSCULAR | Status: AC
  Filled 2013-12-18: qty 1

## 2013-12-18 MED ORDER — BUPIVACAINE-EPINEPHRINE 0.5% -1:200000 IJ SOLN
INTRAMUSCULAR | Status: AC
  Filled 2013-12-18: qty 1

## 2013-12-18 MED ORDER — EPHEDRINE SULFATE 50 MG/ML IJ SOLN
INTRAMUSCULAR | Status: DC | PRN
  Administered 2013-12-18 (×2): 10 mg via INTRAVENOUS

## 2013-12-18 MED ORDER — HYDROMORPHONE HCL PF 1 MG/ML IJ SOLN
0.2500 mg | INTRAMUSCULAR | Status: DC | PRN
  Administered 2013-12-18 (×2): 0.5 mg via INTRAVENOUS

## 2013-12-18 MED ORDER — HEPARIN SODIUM (PORCINE) 5000 UNIT/ML IJ SOLN
5000.0000 [IU] | INTRAMUSCULAR | Status: AC
  Administered 2013-12-18: 5000 [IU] via SUBCUTANEOUS
  Filled 2013-12-18: qty 1

## 2013-12-18 MED ORDER — SODIUM CHLORIDE 0.9 % IJ SOLN
INTRAMUSCULAR | Status: AC
  Filled 2013-12-18: qty 10

## 2013-12-18 MED ORDER — ONDANSETRON HCL 4 MG/2ML IJ SOLN
INTRAMUSCULAR | Status: AC
  Filled 2013-12-18: qty 2

## 2013-12-18 MED ORDER — MORPHINE SULFATE 2 MG/ML IJ SOLN
2.0000 mg | INTRAMUSCULAR | Status: DC | PRN
  Administered 2013-12-18: 2 mg via INTRAVENOUS
  Administered 2013-12-18: 4 mg via INTRAVENOUS
  Administered 2013-12-19: 6 mg via INTRAVENOUS
  Administered 2013-12-19: 4 mg via INTRAVENOUS
  Administered 2013-12-19: 2 mg via INTRAVENOUS
  Administered 2013-12-19: 4 mg via INTRAVENOUS
  Filled 2013-12-18: qty 1
  Filled 2013-12-18: qty 3
  Filled 2013-12-18: qty 2
  Filled 2013-12-18: qty 1
  Filled 2013-12-18 (×2): qty 2

## 2013-12-18 MED ORDER — CHLORHEXIDINE GLUCONATE 0.12 % MT SOLN
15.0000 mL | Freq: Two times a day (BID) | OROMUCOSAL | Status: DC
Start: 2013-12-18 — End: 2013-12-20
  Administered 2013-12-18 – 2013-12-20 (×4): 15 mL via OROMUCOSAL
  Filled 2013-12-18 (×6): qty 15

## 2013-12-18 MED ORDER — GLYCOPYRROLATE 0.2 MG/ML IJ SOLN
INTRAMUSCULAR | Status: DC | PRN
  Administered 2013-12-18: .8 mg via INTRAVENOUS

## 2013-12-18 MED ORDER — FENTANYL CITRATE 0.05 MG/ML IJ SOLN
INTRAMUSCULAR | Status: AC
  Filled 2013-12-18: qty 5

## 2013-12-18 MED ORDER — DEXAMETHASONE SODIUM PHOSPHATE 10 MG/ML IJ SOLN
INTRAMUSCULAR | Status: AC
  Filled 2013-12-18: qty 1

## 2013-12-18 MED ORDER — LABETALOL HCL 5 MG/ML IV SOLN
INTRAVENOUS | Status: DC | PRN
  Administered 2013-12-18 (×2): 5 mg via INTRAVENOUS

## 2013-12-18 MED ORDER — OXYCODONE HCL 5 MG/5ML PO SOLN
5.0000 mg | ORAL | Status: DC | PRN
  Administered 2013-12-19 – 2013-12-20 (×5): 5 mg via ORAL
  Filled 2013-12-18: qty 10
  Filled 2013-12-18 (×4): qty 5

## 2013-12-18 MED ORDER — FENTANYL CITRATE 0.05 MG/ML IJ SOLN
INTRAMUSCULAR | Status: DC | PRN
  Administered 2013-12-18 (×10): 50 ug via INTRAVENOUS

## 2013-12-18 MED ORDER — GLYCOPYRROLATE 0.2 MG/ML IJ SOLN
INTRAMUSCULAR | Status: AC
  Filled 2013-12-18: qty 1

## 2013-12-18 MED ORDER — CHLORHEXIDINE GLUCONATE 4 % EX LIQD: 60.0000 mL | Freq: Once | CUTANEOUS | Status: DC

## 2013-12-18 MED ORDER — DEXTROSE 5 % IV SOLN
INTRAVENOUS | Status: AC
  Filled 2013-12-18: qty 2

## 2013-12-18 MED ORDER — ONDANSETRON HCL 4 MG/2ML IJ SOLN
INTRAMUSCULAR | Status: DC | PRN
  Administered 2013-12-18: 4 mg via INTRAVENOUS

## 2013-12-18 MED ORDER — TISSEEL VH 10 ML EX KIT
PACK | CUTANEOUS | Status: DC | PRN
  Administered 2013-12-18: 20 mL

## 2013-12-18 MED ORDER — ACETAMINOPHEN 10 MG/ML IV SOLN
1000.0000 mg | Freq: Once | INTRAVENOUS | Status: AC
  Administered 2013-12-18: 1000 mg via INTRAVENOUS
  Filled 2013-12-18: qty 100

## 2013-12-18 MED ORDER — LIDOCAINE HCL (CARDIAC) 20 MG/ML IV SOLN
INTRAVENOUS | Status: AC
  Filled 2013-12-18: qty 5

## 2013-12-18 MED ORDER — KCL IN DEXTROSE-NACL 20-5-0.45 MEQ/L-%-% IV SOLN
INTRAVENOUS | Status: DC
  Administered 2013-12-18 – 2013-12-19 (×4): via INTRAVENOUS
  Filled 2013-12-18 (×7): qty 1000

## 2013-12-18 MED ORDER — MEPERIDINE HCL 50 MG/ML IJ SOLN: 6.2500 mg | INTRAMUSCULAR | Status: DC | PRN

## 2013-12-18 MED ORDER — SUCCINYLCHOLINE CHLORIDE 20 MG/ML IJ SOLN
INTRAMUSCULAR | Status: DC | PRN
  Administered 2013-12-18: 100 mg via INTRAVENOUS

## 2013-12-18 MED ORDER — PHENYLEPHRINE 40 MCG/ML (10ML) SYRINGE FOR IV PUSH (FOR BLOOD PRESSURE SUPPORT)
PREFILLED_SYRINGE | INTRAVENOUS | Status: AC
  Filled 2013-12-18: qty 10

## 2013-12-18 MED ORDER — TISSEEL VH 10 ML EX KIT
PACK | CUTANEOUS | Status: AC
  Filled 2013-12-18: qty 2

## 2013-12-18 MED ORDER — MIDAZOLAM HCL 2 MG/2ML IJ SOLN
INTRAMUSCULAR | Status: AC
  Filled 2013-12-18: qty 2

## 2013-12-18 MED ORDER — NEOSTIGMINE METHYLSULFATE 10 MG/10ML IV SOLN
INTRAVENOUS | Status: DC | PRN
  Administered 2013-12-18: 4.5 mg via INTRAVENOUS

## 2013-12-18 MED ORDER — UNJURY CHOCOLATE CLASSIC POWDER
2.0000 [oz_av] | Freq: Four times a day (QID) | ORAL | Status: DC
  Administered 2013-12-20: 2 [oz_av] via ORAL

## 2013-12-18 MED ORDER — LACTATED RINGERS IV SOLN
INTRAVENOUS | Status: DC
  Administered 2013-12-18: 1000 mL via INTRAVENOUS
  Administered 2013-12-18 (×2): via INTRAVENOUS

## 2013-12-18 MED ORDER — ENOXAPARIN SODIUM 30 MG/0.3ML ~~LOC~~ SOLN
30.0000 mg | Freq: Two times a day (BID) | SUBCUTANEOUS | Status: DC
  Administered 2013-12-19 – 2013-12-20 (×3): 30 mg via SUBCUTANEOUS
  Filled 2013-12-18 (×5): qty 0.3

## 2013-12-18 MED ORDER — CETYLPYRIDINIUM CHLORIDE 0.05 % MT LIQD
7.0000 mL | Freq: Two times a day (BID) | OROMUCOSAL | Status: DC
  Administered 2013-12-19 (×2): 7 mL via OROMUCOSAL

## 2013-12-18 MED ORDER — MIDAZOLAM HCL 5 MG/5ML IJ SOLN
INTRAMUSCULAR | Status: DC | PRN
  Administered 2013-12-18: 2 mg via INTRAVENOUS

## 2013-12-18 MED ORDER — ROCURONIUM BROMIDE 100 MG/10ML IV SOLN
INTRAVENOUS | Status: DC | PRN
  Administered 2013-12-18: 40 mg via INTRAVENOUS
  Administered 2013-12-18: 10 mg via INTRAVENOUS
  Administered 2013-12-18: 5 mg via INTRAVENOUS

## 2013-12-18 MED ORDER — PANTOPRAZOLE SODIUM 40 MG IV SOLR
40.0000 mg | INTRAVENOUS | Status: DC
  Administered 2013-12-18 – 2013-12-19 (×2): 40 mg via INTRAVENOUS
  Filled 2013-12-18 (×3): qty 40

## 2013-12-18 MED ORDER — ONDANSETRON HCL 4 MG/2ML IJ SOLN
4.0000 mg | INTRAMUSCULAR | Status: DC | PRN
  Administered 2013-12-18 – 2013-12-19 (×4): 4 mg via INTRAVENOUS
  Filled 2013-12-18 (×4): qty 2

## 2013-12-18 MED ORDER — ACETAMINOPHEN 160 MG/5ML PO SOLN: 650.0000 mg | ORAL | Status: DC | PRN

## 2013-12-18 MED ORDER — UNJURY VANILLA POWDER: 2.0000 [oz_av] | Freq: Four times a day (QID) | ORAL | Status: DC

## 2013-12-18 MED ORDER — LIDOCAINE HCL (CARDIAC) 20 MG/ML IV SOLN
INTRAVENOUS | Status: DC | PRN
  Administered 2013-12-18: 100 mg via INTRAVENOUS

## 2013-12-18 MED ORDER — PROPOFOL 10 MG/ML IV BOLUS
INTRAVENOUS | Status: DC | PRN
  Administered 2013-12-18: 200 mg via INTRAVENOUS

## 2013-12-18 MED ORDER — PROPOFOL 10 MG/ML IV BOLUS
INTRAVENOUS | Status: AC
  Filled 2013-12-18: qty 20

## 2013-12-18 MED ORDER — EPHEDRINE SULFATE 50 MG/ML IJ SOLN
INTRAMUSCULAR | Status: AC
  Filled 2013-12-18: qty 1

## 2013-12-18 MED ORDER — ACETAMINOPHEN 160 MG/5ML PO SOLN: 325.0000 mg | ORAL | Status: DC | PRN

## 2013-12-18 MED ORDER — DEXTROSE 5 % IV SOLN
2.0000 g | INTRAVENOUS | Status: AC
  Administered 2013-12-18: 2 g via INTRAVENOUS

## 2013-12-18 MED ORDER — DEXAMETHASONE SODIUM PHOSPHATE 10 MG/ML IJ SOLN
INTRAMUSCULAR | Status: DC | PRN
  Administered 2013-12-18: 10 mg via INTRAVENOUS

## 2013-12-18 MED ORDER — LACTATED RINGERS IR SOLN
Status: DC | PRN
  Administered 2013-12-18: 1000 mL

## 2013-12-18 SURGICAL SUPPLY — 55 items
APPLICATOR COTTON TIP 6IN STRL (MISCELLANEOUS) IMPLANT
APPLIER CLIP ROT 10 11.4 M/L (STAPLE) ×2
BLADE SURG SZ11 CARB STEEL (BLADE) ×2 IMPLANT
CABLE HIGH FREQUENCY MONO STRZ (ELECTRODE) ×2 IMPLANT
CHLORAPREP W/TINT 26ML (MISCELLANEOUS) ×4 IMPLANT
CLIP APPLIE ROT 10 11.4 M/L (STAPLE) ×1 IMPLANT
DERMABOND ADVANCED (GAUZE/BANDAGES/DRESSINGS) ×1
DERMABOND ADVANCED .7 DNX12 (GAUZE/BANDAGES/DRESSINGS) ×1 IMPLANT
DEVICE SUT QUICK LOAD TK 5 (STAPLE) IMPLANT
DEVICE SUT TI-KNOT TK 5X26 (MISCELLANEOUS) IMPLANT
DEVICE SUTURE ENDOST 10MM (ENDOMECHANICALS) IMPLANT
DEVICE TROCAR PUNCTURE CLOSURE (ENDOMECHANICALS) ×2 IMPLANT
DRAPE CAMERA CLOSED 9X96 (DRAPES) ×2 IMPLANT
DRAPE UTILITY XL STRL (DRAPES) ×4 IMPLANT
ELECT REM PT RETURN 9FT ADLT (ELECTROSURGICAL) ×2
ELECTRODE REM PT RTRN 9FT ADLT (ELECTROSURGICAL) ×1 IMPLANT
GAUZE SPONGE 4X4 12PLY STRL (GAUZE/BANDAGES/DRESSINGS) IMPLANT
GLOVE BIOGEL M STRL SZ7.5 (GLOVE) ×2 IMPLANT
GOWN STRL REUS W/TWL XL LVL3 (GOWN DISPOSABLE) ×6 IMPLANT
HOVERMATT SINGLE USE (MISCELLANEOUS) ×2 IMPLANT
KIT BASIN OR (CUSTOM PROCEDURE TRAY) ×2 IMPLANT
NEEDLE SPNL 22GX3.5 QUINCKE BK (NEEDLE) ×2 IMPLANT
PACK UNIVERSAL I (CUSTOM PROCEDURE TRAY) ×2 IMPLANT
PEN SKIN MARKING BROAD (MISCELLANEOUS) ×2 IMPLANT
RELOAD STAPLER BLUE 60MM (STAPLE) ×6 IMPLANT
RELOAD STAPLER GOLD 60MM (STAPLE) IMPLANT
RELOAD STAPLER GREEN 60MM (STAPLE) ×2 IMPLANT
SCISSORS LAP 5X35 DISP (ENDOMECHANICALS) IMPLANT
SCISSORS LAP 5X45 EPIX DISP (ENDOMECHANICALS) ×2 IMPLANT
SEALANT SURGICAL APPL DUAL CAN (MISCELLANEOUS) ×2 IMPLANT
SET IRRIG TUBING LAPAROSCOPIC (IRRIGATION / IRRIGATOR) ×2 IMPLANT
SHEARS CURVED HARMONIC AC 45CM (MISCELLANEOUS) ×2 IMPLANT
SLEEVE ADV FIXATION 5X100MM (TROCAR) ×4 IMPLANT
SLEEVE GASTRECTOMY 36FR VISIGI (MISCELLANEOUS) ×2 IMPLANT
SLEEVE XCEL OPT CAN 5 100 (ENDOMECHANICALS) IMPLANT
SOLUTION ANTI FOG 6CC (MISCELLANEOUS) ×2 IMPLANT
STAPLE ECHEON FLEX 60 POW ENDO (STAPLE) IMPLANT
STAPLER ECHELON LONG 60 440 (INSTRUMENTS) ×2 IMPLANT
STAPLER RELOAD BLUE 60MM (STAPLE) ×12
STAPLER RELOAD GOLD 60MM (STAPLE)
STAPLER RELOAD GREEN 60MM (STAPLE) ×4
SUT MNCRL AB 4-0 PS2 18 (SUTURE) ×2 IMPLANT
SUT SURGIDAC NAB ES-9 0 48 120 (SUTURE) IMPLANT
SUT VICRYL 0 TIES 12 18 (SUTURE) ×2 IMPLANT
SYR 20CC LL (SYRINGE) ×4 IMPLANT
SYR 50ML LL SCALE MARK (SYRINGE) ×2 IMPLANT
TOWEL OR 17X26 10 PK STRL BLUE (TOWEL DISPOSABLE) ×2 IMPLANT
TOWEL OR NON WOVEN STRL DISP B (DISPOSABLE) ×2 IMPLANT
TRAY FOLEY CATH 14FRSI W/METER (CATHETERS) IMPLANT
TROCAR ADV FIXATION 5X100MM (TROCAR) ×2 IMPLANT
TROCAR BLADELESS 15MM (ENDOMECHANICALS) IMPLANT
TROCAR BLADELESS OPT 5 100 (ENDOMECHANICALS) ×2 IMPLANT
TUBING CONNECTING 10 (TUBING) ×2 IMPLANT
TUBING ENDO SMARTCAP (MISCELLANEOUS) ×2 IMPLANT
TUBING FILTER THERMOFLATOR (ELECTROSURGICAL) ×2 IMPLANT

## 2013-12-18 NOTE — Op Note (Signed)
[name] [name] [id number] May 28, 1950 12/18/2013  Preoperative diagnosis: sleeve gastrectomy in progress  Postoperative diagnosis: Same   Procedure: Upper endoscopy   Surgeon: Catalina Antigua B. Hassell Done  M.D., FACS   Anesthesia: Gen.   Indications for procedure: 63 yo WF undergoing a laparoscopic sleeve gastrectomy and an upper endoscopy was requested to evaluate the anastomosis.  Description of procedure: After we have completed the new gastrojejunostomy, I scrubbed out and obtained the Olympus endoscope. I gently placed endoscope in the patient's oropharynx and gently glided it down the esophagus without any difficulty under direct visualization. Once I was in the gastric sleeve, I insufflated the pouch was air. I passed this scope down the sleeve to visualize the pylorus.  The staple line was not bleeding and no bubbles were seen.  The scope was withdrawn without difficulty.   Matt B. Hassell Done, MD, FACS General, Bariatric, & Minimally Invasive Surgery [facility], [place]

## 2013-12-18 NOTE — Transfer of Care (Signed)
Immediate Anesthesia Transfer of Care Note  Patient: [name] [name]  Procedure(s) Performed: Procedure(s) (LRB): LAPAROSCOPIC GASTRIC SLEEVE RESECTION (N/A)  Patient Location: PACU  Anesthesia Type: General  Level of Consciousness: sedated, patient cooperative and responds to stimulation  Airway & Oxygen Therapy: Patient Spontanous Breathing and Patient connected to face mask oxgen  Post-op Assessment: Report given to PACU RN and Post -op Vital signs reviewed and stable  Post vital signs: Reviewed and stable  Complications: No apparent anesthesia complications

## 2013-12-18 NOTE — H&P (View-Only) (Signed)
Patient ID: [name] [name], female   DOB: 1950/10/24, 63 y.o.   MRN: [id number]  Chief Complaint  Patient presents with  . Bariatric Pre-op    sleeve sx 12/18/13    HPI [name] [name] is a 63 y.o. female.    HPI 63 yo morbidly obese WF comes in for her preop surgery appt. She has been approved for Laparoscopic Sleeve Gastrectomy on 9/15.  She denies any changes since her last visit. The only knew issue is some constipation.  Past Medical History  Diagnosis Date  . Coronary vasospasm     Dr. Ron Parker  . Osteoporosis   . Vitamin D deficiency   . Strabismus   . Anemia   . Blood transfusion without reported diagnosis   . Hypertension     Past Surgical History  Procedure Laterality Date  . Appendectomy    . Abdominal hysterectomy    . Tonsillectomy    . Strabismus repair    . Repair tendons foot Right 1994  . Inner ear surgery Left 1965  . Breath tek h pylori N/A 10/08/2013    Procedure: BREATH TEK H PYLORI;  Surgeon: Gayland Curry, MD;  Location: [facility];  Service: General;  Laterality: N/A;    Family History  Problem Relation Age of Onset  . Hypertension Mother   . Diabetes Mother   . Obesity Mother   . Heart disease Mother   . COPD Other   . Hyperlipidemia Other     Social History History  Substance Use Topics  . Smoking status: Never Smoker   . Smokeless tobacco: Not on file  . Alcohol Use: No    No Known Allergies  Current Outpatient Prescriptions  Medication Sig Dispense Refill  . aspirin (ASPIRIN LOW DOSE) 81 MG tablet Take 81 mg by mouth daily.        . Calcium Carbonate (CALCIUM 500 PO) Take 2 capsules by mouth daily.       Marland Kitchen docusate sodium (COLACE) 100 MG capsule Take 100 mg by mouth 3 (three) times daily as needed.        Marland Kitchen ibuprofen (ADVIL,MOTRIN) 200 MG tablet Take 200 mg by mouth every 6 (six) hours as needed.        . Multiple Vitamin (MULTIVITAMIN) capsule Take 1 capsule by mouth daily.        . naproxen sodium (ANAPROX) 220 MG tablet Take  440mg  to 880mg  as needed       . nystatin (MYCOSTATIN) powder Apply topically 2 (two) times daily.  60 g  1  . Vitamin D, Ergocalciferol, (DRISDOL) 50000 UNITS CAPS capsule Take 1 capsule (50,000 Units total) by mouth every 7 (seven) days.  6 capsule  0  . oxyCODONE (ROXICODONE) 5 MG/5ML solution Take 5-10 mLs (5-10 mg total) by mouth every 4 (four) hours as needed for severe pain.  200 mL  0  . pantoprazole (PROTONIX) 40 MG tablet Take 1 tablet (40 mg total) by mouth daily.  30 tablet  1   No current facility-administered medications for this visit.    Review of Systems Review of Systems  Constitutional: Negative for fever, activity change, appetite change and unexpected weight change.  HENT: Negative for nosebleeds and trouble swallowing.   Eyes: Negative for photophobia and visual disturbance.  Respiratory: Negative for chest tightness and shortness of breath.   Cardiovascular: Negative for chest pain and leg swelling.       Denies CP, SOB, orthopnea, PND, DOE  Gastrointestinal: Negative for nausea,  vomiting, abdominal pain and blood in stool.       Denies reflux  Genitourinary: Negative for dysuria and difficulty urinating.  Musculoskeletal: Negative for arthralgias.       Knee pain; foot pain  Skin: Negative for pallor and rash.  Neurological: Negative for dizziness, seizures, facial asymmetry and numbness.       Denies TIA and amaurosis fugax   Hematological: Negative for adenopathy. Does not bruise/bleed easily.  Psychiatric/Behavioral: Negative for behavioral problems and agitation.    Blood pressure 132/78, pulse 78, temperature 98 F (36.7 C), resp. rate 18, height 4' 11.5" (1.511 m), weight 201 lb (91.173 kg).  Physical Exam Physical Exam  Vitals reviewed. Constitutional: She is oriented to person, place, and time. She appears well-developed and well-nourished. No distress.  HENT:  Head: Normocephalic and atraumatic.  Right Ear: External ear normal.  Left Ear:  External ear normal.  Eyes: Conjunctivae are normal. No scleral icterus.  Neck: Normal range of motion. Neck supple. No tracheal deviation present. No thyromegaly present.  Cardiovascular: Normal rate and normal heart sounds.   Pulmonary/Chest: Effort normal and breath sounds normal. No stridor. No respiratory distress. She has no wheezes.  Abdominal: Soft. She exhibits no distension. There is no tenderness. There is no rebound.    Musculoskeletal: She exhibits no edema and no tenderness.  Lymphadenopathy:    She has no cervical adenopathy.  Neurological: She is alert and oriented to person, place, and time. She exhibits normal muscle tone.  Skin: Skin is warm and dry. No rash noted. She is not diaphoretic. No erythema.  Psychiatric: She has a normal mood and affect. Her behavior is normal. Judgment and thought content normal.    Data Reviewed My office note nml ugi nlml cxr Negative sleep study Evaluation labs - TC 201, TG 190, LDL 115 Assessment    Morbid obesity BMI 42.4  Elevated blood pressure  Osteopenia  Right knee pain  Bilateral foot pain  Vit D deficiency Dislipidemia     Plan    We reviewed her preop workup.  She was given her postop pain medication prescription today. All of her questions were asked and answered. We briefly reviewed the typical hospitalization.   [name]. [name], MD, FACS General, Bariatric, & Minimally Invasive Surgery [facility] [place]        [facility] M 12/02/2013, 6:27 PM

## 2013-12-18 NOTE — Interval H&P Note (Signed)
History and Physical Interval Note:  12/18/2013 1:11 PM  [name] [name]  has presented today for surgery, with the diagnosis of Morbid Obesity  The various methods of treatment have been discussed with the patient and family. After consideration of risks, benefits and other options for treatment, the patient has consented to  Procedure(s): LAPAROSCOPIC GASTRIC SLEEVE RESECTION (N/A) as a surgical intervention .  The patient's history has been reviewed, patient examined, no change in status, stable for surgery.  I have reviewed the patient's chart and labs.  Questions were answered to the patient's satisfaction.    [name]. [name], MD, [place], Bariatric, & Minimally Invasive Surgery [facility], [place]   [facility] M

## 2013-12-18 NOTE — Anesthesia Preprocedure Evaluation (Signed)
Anesthesia Evaluation  Patient identified by MRN, date of birth, ID band Patient awake    Reviewed: Allergy & Precautions, H&P , NPO status , Patient's Chart, lab work & pertinent test results  Airway Mallampati: II TM Distance: >3 FB Neck ROM: Full    Dental no notable dental hx.    Pulmonary neg pulmonary ROS,  breath sounds clear to auscultation  Pulmonary exam normal       Cardiovascular hypertension, Pt. on medications + angina + CAD Rhythm:Regular Rate:Normal     Neuro/Psych  Headaches, negative psych ROS   GI/Hepatic negative GI ROS, Neg liver ROS,   Endo/Other  Morbid obesity  Renal/GU negative Renal ROS     Musculoskeletal negative musculoskeletal ROS (+) Arthritis -,   Abdominal   Peds  Hematology negative hematology ROS (+) anemia ,   Anesthesia Other Findings   Reproductive/Obstetrics negative OB ROS                           Anesthesia Physical Anesthesia Plan  ASA: III  Anesthesia Plan: General   Post-op Pain Management:    Induction: Intravenous  Airway Management Planned: Oral ETT  Additional Equipment:   Intra-op Plan:   Post-operative Plan: Extubation in OR  Informed Consent: I have reviewed the patients History and Physical, chart, labs and discussed the procedure including the risks, benefits and alternatives for the proposed anesthesia with the patient or authorized representative who has indicated his/her understanding and acceptance.   Dental advisory given  Plan Discussed with: CRNA  Anesthesia Plan Comments:         Anesthesia Quick Evaluation

## 2013-12-18 NOTE — Anesthesia Postprocedure Evaluation (Signed)
  Anesthesia Post-op Note  Patient: [name] [name]  Procedure(s) Performed: Procedure(s) (LRB): LAPAROSCOPIC GASTRIC SLEEVE RESECTION (N/A)  Patient Location: PACU  Anesthesia Type: General  Level of Consciousness: awake and alert   Airway and Oxygen Therapy: Patient Spontanous Breathing  Post-op Pain: mild  Post-op Assessment: Post-op Vital signs reviewed, Patient's Cardiovascular Status Stable, Respiratory Function Stable, Patent Airway and No signs of Nausea or vomiting  Last Vitals:  Filed Vitals:   12/18/13 1551  BP: 144/68  Pulse: 84  Temp: 36.8 C  Resp: 16    Post-op Vital Signs: stable   Complications: No apparent anesthesia complications

## 2013-12-18 NOTE — Op Note (Signed)
12/18/2013 [name] [name] 15-Oct-1950 [id number]   PRE-OPERATIVE DIAGNOSIS:   Morbid obesity BMI 40.7 Elevated blood pressure  Osteopenia  Right knee pain  Bilateral foot pain  Vit D deficiency  Dislipidemia    POST-OPERATIVE DIAGNOSIS:  same  PROCEDURE:  Procedure(s): LAPAROSCOPIC SLEEVE GASTRECTOMY UPPER GI ENDOSCOPY  SURGEON:  Surgeon(s): Gayland Curry, MD FACS  ASSISTANTS: Johnathan Hausen, MD FACS  ANESTHESIA:   general  DRAINS: none   BOUGIE: 95 fr ViSiGi  LOCAL MEDICATIONS USED:  MARCAINE     SPECIMEN:  Source of Specimen:  Greater curvature of stomach  DISPOSITION OF SPECIMEN:  PATHOLOGY  COUNTS:  YES  INDICATION FOR PROCEDURE: This is a very pleasant 63 year old morbidly obese WF who has had unsuccessful attempts for sustained weight loss. She presents today for a planned laparoscopic sleeve gastrectomy with upper endoscopy. We have discussed the risk and benefits of the procedure extensively preoperatively. Please see my separate notes.  PROCEDURE: After obtaining informed consent and receiving 5000 units of subcutaneous heparin, the patient was brought to the operating room at [facility] and placed supine on the operating room table. General endotracheal anesthesia was established. Sequential compression devices were placed. A Foley catheter was placed. A orogastric tube was placed. The patient's abdomen was prepped and draped in the usual standard surgical fashion. She received preoperative IV antibiotics. A surgical timeout was performed.  Access to the abdomen was achieved using a 5 mm 0 laparoscope thru a 5 mm trocar In the left upper Quadrant 2 fingerbreadths below the left subcostal margin using the Optiview technique. Pneumoperitoneum was smoothly established up to 15 mm of mercury. The laparoscope was advanced and the abdominal cavity was surveilled. There were some midline and right paracolic gutter thin omental adhesions which were left alone.   A 5 mm trocar was placed slightly above and to the left of the umbilicus under direct visualization. The patient was then placed in reverse Trendelenburg. The [facility] liver retractor was placed under the left lobe of the liver through a 5 mm trocar incision site in the subxiphoid position. A 5 mm trocar was placed in the lateral right upper quadrant along with a 15 mm trocar in the mid right abdomen  All under direct visualization after local had been infiltrated.  The stomach was inspected. It was completely decompressed and the orogastric tube was removed. We identified the pylorus and measured 5 cm proximal to the pylorus and identified an area of where we would start taking down the short gastric vessels. Harmonic scalpel was used to take down the short gastric vessels along the greater curvature of the stomach. We were able to enter the lesser sac. We continued to march along the greater curvature of the stomach taking down the short gastrics. As we approached the gastrosplenic ligament we took care in this area not to injure the spleen. We were able to take down the entire gastrosplenic ligament. We then mobilized the fundus away from the left crus of diaphragm. There were a few posterior gastric avascular attachments which were taken down. This left the stomach completely mobilized. No vessels had been taken down along the lesser curvature of the stomach.  We then reidentified the pylorus. A 36Fr ViSiGi was then placed in the oropharynx and advanced down into the stomach and placed in the distal antrum and positioned along the lesser curvature. It was placed under suction which secured the 36Fr ViSiGi in place along the lesser curve. Then using the [employer]icon echelon 60  mm stapler with a green load, I placed a stapler along the antrum approximately 5 cm from the pylorus. The stapler was angled so that there is ample room at the angularis incisura. I then fired the first staple load after inspecting it  posteriorly to ensure adequate space both anteriorly and posteriorly. At this point I still was not completely past the angularis so with another green load, I placed the stapler in position just inside the prior stapleline. We then rotated the stomach to insure that there was adequate anteriorly as well as posteriorly. The stapler was then fired. At this point I started using 30mm blue load staple cartridges. The echelon stapler was then repositioned with a 60 mm blue load and we continued to march up along the [place]. My assistant was holding traction along the greater curvature stomach along the cauterized short gastric vessels ensuring that the stomach was symmetrically retracted. On these initial first 2 firings of the 7mm blue load I did not fully hilt the stomach in the crotch of stapler bc of alignment concerns. Prior to each firing of the staple, we rotated the stomach to ensure that there is adequate stomach left.  As we approached the fundus, the last firing of the stapler was lateral to the esophageal fat pad. Although the staples on this fire had completely gone thru the last part of the stomach it had not completely cut it. Therefore 1 additional 60 blue load was used to free the remaining stomach. The sleeve was inspected. There is no evidence of cork screw. The staple line appeared hemostatic except for 2 areas which had a little oozing. 67mm clips were placed in each area. The CRNA inflated the ViSiGi to the green zone and the upper abdomen was flooded with saline. There were no bubbles. The sleeve was decompressed and the ViSiGi removed. My assistant scrubbed out and performed an upper endoscopy. The sleeve easily distended with air and the scope was easily advanced to the pylorus. There is no evidence of internal bleeding or cork screwing. There was no narrowing at the angularis. There is no evidence of bubbles. Please see his operative note for further details. The gastric sleeve was  decompressed and the endoscope was removed. Tisseel tissue sealant was applied along the entire length of the staple line. The greater curvature the stomach was grasped with a laparoscopic grasper and removed from the 15 mm trocar site.  The liver retractor was removed. I then closed the 15 mm trocar site with 2 interrupted 0 Vicryl sutures through the fascia using the endoclose. The closure was viewed laparoscopically and it was airtight. Pneumoperitoneum was released. All trocar sites were closed with a 4-0 Monocryl in a subcuticular fashion followed by the application of Dermabond. The Foley catheter was removed. The patient was extubated and taken to the recovery room in stable condition. All needle, instrument, and sponge counts were correct x2. There are no immediate complications  (2) 78IO green; (6) 68mm blue loads  PLAN OF CARE: Admit to inpatient   PATIENT DISPOSITION:  PACU - hemodynamically stable.   Delay start of Pharmacological VTE agent (>24hrs) due to surgical blood loss or risk of bleeding:  no  [name]. [name], MD, FACS General, Bariatric, & Minimally Invasive Surgery [facility], [place]

## 2013-12-19 ENCOUNTER — Encounter (HOSPITAL_COMMUNITY): Payer: Self-pay | Admitting: General Surgery

## 2013-12-19 ENCOUNTER — Inpatient Hospital Stay (HOSPITAL_COMMUNITY): Payer: BC Managed Care – PPO

## 2013-12-19 LAB — CBC WITH DIFFERENTIAL/PLATELET
BASOS PCT: 0 % (ref 0–1)
Basophils Absolute: 0 10*3/uL (ref 0.0–0.1)
EOS ABS: 0 10*3/uL (ref 0.0–0.7)
Eosinophils Relative: 0 % (ref 0–5)
HEMATOCRIT: 38.7 % (ref 36.0–46.0)
HEMOGLOBIN: 12.6 g/dL (ref 12.0–15.0)
Lymphocytes Relative: 23 % (ref 12–46)
Lymphs Abs: 3.1 10*3/uL (ref 0.7–4.0)
MCH: 31 pg (ref 26.0–34.0)
MCHC: 32.6 g/dL (ref 30.0–36.0)
MCV: 95.1 fL (ref 78.0–100.0)
MONO ABS: 1.1 10*3/uL — AB (ref 0.1–1.0)
MONOS PCT: 8 % (ref 3–12)
Neutro Abs: 9.1 10*3/uL — ABNORMAL HIGH (ref 1.7–7.7)
Neutrophils Relative %: 69 % (ref 43–77)
Platelets: 312 10*3/uL (ref 150–400)
RBC: 4.07 MIL/uL (ref 3.87–5.11)
RDW: 13.8 % (ref 11.5–15.5)
WBC: 13.3 10*3/uL — ABNORMAL HIGH (ref 4.0–10.5)

## 2013-12-19 LAB — COMPREHENSIVE METABOLIC PANEL
ALBUMIN: 3.6 g/dL (ref 3.5–5.2)
ALT: 37 U/L — ABNORMAL HIGH (ref 0–35)
ANION GAP: 10 (ref 5–15)
AST: 40 U/L — ABNORMAL HIGH (ref 0–37)
Alkaline Phosphatase: 71 U/L (ref 39–117)
BUN: 8 mg/dL (ref 6–23)
CO2: 26 mEq/L (ref 19–32)
CREATININE: 0.6 mg/dL (ref 0.50–1.10)
Calcium: 8.7 mg/dL (ref 8.4–10.5)
Chloride: 106 mEq/L (ref 96–112)
GFR calc Af Amer: >90 mL/min (ref >90)
GFR calc non Af Amer: >90 mL/min (ref >90)
Glucose, Bld: 130 mg/dL — ABNORMAL HIGH (ref 70–99)
Potassium: 4.4 mEq/L (ref 3.7–5.3)
Sodium: 142 mEq/L (ref 137–147)
TOTAL PROTEIN: 6.4 g/dL (ref 6.0–8.3)
Total Bilirubin: <0.2 mg/dL — ABNORMAL LOW (ref 0.3–1.2)

## 2013-12-19 LAB — HEMOGLOBIN AND HEMATOCRIT, BLOOD
HCT: 38.7 % (ref 36.0–46.0)
Hemoglobin: 12.7 g/dL (ref 12.0–15.0)

## 2013-12-19 MED ORDER — IOHEXOL 300 MG/ML  SOLN
50.0000 mL | Freq: Once | INTRAMUSCULAR | Status: AC | PRN
  Administered 2013-12-19: 50 mL via ORAL

## 2013-12-19 MED ORDER — ALUM & MAG HYDROXIDE-SIMETH 200-200-20 MG/5ML PO SUSP: 15.0000 mL | ORAL | Status: DC | PRN

## 2013-12-19 NOTE — Progress Notes (Signed)
1 Day Post-Op  Subjective: Some burping and nausea. Pain ok. Ambulated. Could have slept better  Objective: Vital signs in last 24 hours: Temp:  [97.6 F (36.4 C)-98.4 F (36.9 C)] 98.1 F (36.7 C) (09/16 0634) Pulse Rate:  [65-88] 75 (09/16 0634) Resp:  [8-18] 18 (09/16 0634) BP: (118-149)/(55-93) 123/58 mmHg (09/16 0634) SpO2:  [94 %-100 %] 98 % (09/16 0634) Weight:  [195 lb 6.4 oz (88.633 kg)] 195 lb 6.4 oz (88.633 kg) (09/15 1100) Last BM Date: 12/17/13  Intake/Output from previous day: 09/15 0701 - 09/16 0700 In: 3564.6 [I.V.:3564.6] Out: 1660 [Urine:1650; Blood:10] Intake/Output this shift:    Alert, nad, looks good cta b/l Reg Obese, soft, expected mild incisional TTP, incisions c/d/i - extraction site- some bruising No edema, +SCDs  Lab Results:   Recent Labs  12/18/13 1855 12/19/13 0533  WBC  --  13.3*  HGB 13.4 12.6  HCT 40.6 38.7  PLT  --  312   BMET  Recent Labs  12/19/13 0533  NA 142  K 4.4  CL 106  CO2 26  GLUCOSE 130*  BUN 8  CREATININE 0.60  CALCIUM 8.7   PT/INR No results found for this basename: LABPROT, INR,  in the last 72 hours ABG No results found for this basename: PHART, PCO2, PO2, HCO3,  in the last 72 hours  Studies/Results: No results found.  Anti-infectives: Anti-infectives   Start     Dose/Rate Route Frequency Ordered Stop   12/18/13 1100  cefOXitin (MEFOXIN) 2 g in dextrose 5 % 50 mL IVPB     2 g 100 mL/hr over 30 Minutes Intravenous On call to O.R. 12/18/13 1100 12/18/13 1350      Assessment/Plan: s/p Procedure(s): LAPAROSCOPIC GASTRIC SLEEVE RESECTION (N/A) No fever, tachycardia - no clinical signs of leak For UGI this am, if ok will start POD 1 diet Cont VTE prophylaxis Ambulate, IS  [name]. [name], MD, FACS General, Bariatric, & Minimally Invasive Surgery [facility], [place]   LOS: 1 day    Gayland Curry 12/19/2013

## 2013-12-19 NOTE — Progress Notes (Signed)
Nutrition Education Note  Received consult for diet education per DROP protocol.   Discussed 2 week post op diet with pt. Emphasized that liquids must be non carbonated, non caffeinated, and sugar free. Fluid goals discussed. Pt to follow up with outpatient bariatric RD for further diet progression after 2 weeks. Multivitamins and minerals also reviewed. Teach back method used, pt expressed understanding, expect good compliance. Pt c/o nausea and dizziness when standing, RN aware. Frustrated at her lack of progress, emotional support provided.    Diet: First 2 Weeks  You will see the nutritionist about two (2) weeks after your surgery. The nutritionist will increase the types of foods you can eat if you are handling liquids well:  If you have severe vomiting or nausea and cannot handle clear liquids lasting longer than 1 day, call your surgeon  Protein Shake  Drink at least 2 ounces of shake 5-6 times per day  Each serving of protein shakes (usually 8 - 12 ounces) should have a minimum of:  15 grams of protein  And no more than 5 grams of carbohydrate  Goal for protein each day:  Men = 80 grams per day  Women = 60 grams per day  Protein powder may be added to fluids such as non-fat milk or Lactaid milk or Soy milk (limit to 35 grams added protein powder per serving)   Hydration  Slowly increase the amount of water and other clear liquids as tolerated (See Acceptable Fluids)  Slowly increase the amount of protein shake as tolerated  Sip fluids slowly and throughout the day  May use sugar substitutes in small amounts (no more than 6 - 8 packets per day; i.e. Splenda)   Fluid Goal  The first goal is to drink at least 8 ounces of protein shake/drink per day (or as directed by the nutritionist); some examples of protein shakes are [employer], [employer]R [employer], EAS Edge HP, and Unjury. See handout from pre-op Bariatric Education Class:  Slowly increase the amount of protein shake you drink  as tolerated  You may find it easier to slowly sip shakes throughout the day  It is important to get your proteins in first  Your fluid goal is to drink 64 - 100 ounces of fluid daily  It may take a few weeks to build up to this  32 oz (or more) should be clear liquids  And  32 oz (or more) should be full liquids (see below for examples)  Liquids should not contain sugar, caffeine, or carbonation   Clear Liquids:  Water or Sugar-free flavored water (i.e. Fruit H2O, Propel)  Decaffeinated coffee or tea (sugar-free)  Crystal Lite, Wyler's Lite, Minute Maid Lite  Sugar-free Jell-O  Bouillon or broth  Sugar-free Popsicle: *Less than 20 calories each; Limit 1 per day   Full Liquids:  Protein Shakes/Drinks + 2 choices per day of other full liquids  Full liquids must be:  No More Than 12 grams of Carbs per serving  No More Than 3 grams of Fat per serving  Strained low-fat cream soup  Non-Fat milk  Fat-free Lactaid Milk  Sugar-free yogurt (Dannon Lite & Fit, [place] yogurt)     [place] MS, RD, [place] [phone] Pager [phone] Weekend/After Hours Pager

## 2013-12-19 NOTE — Progress Notes (Signed)
Patient alert and oriented, Post op day 1.  Provided support and encouragement.  Encouraged pulmonary toilet, ambulation and small sips of liquids when swallow study returned satisfactorily.  All questions answered.  Will continue to monitor. 

## 2013-12-19 NOTE — Care Management Note (Signed)
    Page 1 of 1   12/19/2013     12:23:13 PM CARE MANAGEMENT NOTE 12/19/2013  Patient:  [name] [name]   Account Number:  [id number]  Date Initiated:  12/19/2013  Documentation initiated by:  Sunday Spillers  Subjective/Objective Assessment:   63 yo female admitted s/p sleeve gastrectomy. PTA lived at home with spouse.     Action/Plan:   Home when stable   Anticipated DC Date:  12/21/2013   Anticipated DC Plan:  [place]  CM consult      Choice offered to / List presented to:             Status of service:  Completed, signed off Medicare Important Message given?   (If response is "NO", the following Medicare IM given date fields will be blank) Date Medicare IM given:   Medicare IM given by:   Date Additional Medicare IM given:   Additional Medicare IM given by:    Discharge Disposition:  HOME/SELF CARE  Per UR Regulation:  Reviewed for med. necessity/level of care/duration of stay  If discussed at [place] of Stay Meetings, dates discussed:    Comments:

## 2013-12-20 LAB — CBC WITH DIFFERENTIAL/PLATELET
Basophils Absolute: 0 10*3/uL (ref 0.0–0.1)
Basophils Relative: 0 % (ref 0–1)
Eosinophils Absolute: 0 10*3/uL (ref 0.0–0.7)
Eosinophils Relative: 0 % (ref 0–5)
HCT: 37.1 % (ref 36.0–46.0)
Hemoglobin: 11.8 g/dL — ABNORMAL LOW (ref 12.0–15.0)
LYMPHS ABS: 3.8 10*3/uL (ref 0.7–4.0)
LYMPHS PCT: 32 % (ref 12–46)
MCH: 30.9 pg (ref 26.0–34.0)
MCHC: 31.8 g/dL (ref 30.0–36.0)
MCV: 97.1 fL (ref 78.0–100.0)
Monocytes Absolute: 1 10*3/uL (ref 0.1–1.0)
Monocytes Relative: 8 % (ref 3–12)
Neutro Abs: 7.2 10*3/uL (ref 1.7–7.7)
Neutrophils Relative %: 60 % (ref 43–77)
PLATELETS: 271 10*3/uL (ref 150–400)
RBC: 3.82 MIL/uL — AB (ref 3.87–5.11)
RDW: 14.1 % (ref 11.5–15.5)
WBC: 12.1 10*3/uL — AB (ref 4.0–10.5)

## 2013-12-20 MED ORDER — ONDANSETRON HCL 4 MG PO TABS: 4.0000 mg | ORAL_TABLET | Freq: Three times a day (TID) | ORAL | Status: DC | PRN

## 2013-12-20 NOTE — Progress Notes (Signed)
Pt assessment unchanged.  Pt has possession of all personal belongings.  Pt has received verbal and written discharge instructions and states that they understand their discharge instructions.  Pt received prescriptions prior to discharge.  Pt accompanied by husband at discharge and transported downstairs via wheelchair to her vehicle at the main entrance.  Izola Price ,RN 12/20/2013

## 2013-12-20 NOTE — Discharge Instructions (Signed)

## 2013-12-20 NOTE — Progress Notes (Signed)
Patient alert and oriented, pain is controlled. Patient is tolerating fluids,  advanced to protein shake today, patient tolerated well.  Reviewed Gastric sleeve discharge instructions with patient and patient is able to articulate understanding.  Provided information on BELT program, Support Group and [facility] outpatient pharmacy. All questions answered, will continue to monitor.  

## 2013-12-20 NOTE — Clinical Documentation Improvement (Signed)
Presents with Morbid Obesity; had gastric sleeve performed. Anemia documented in H&P.   H&H 13.4 / 40.6 on admission; 11.8 / 37 today  Serial monitoring performed  Please provide a diagnosis associated with the above clinical data and render an opinion of in your next progress note if clinically significant.  Acute Blood Loss Anemia Acute on chronic blood loss anemia Iron Deficiency Anemia - Chronic Other Condition  Thank You, Zoila Shutter ,RN Clinical Documentation Specialist:  [place] Information Management

## 2013-12-20 NOTE — Discharge Summary (Signed)
Physician Discharge Summary  [name] [name] TGG:[id number] DOB: 1951-03-03 DOA: 12/18/2013  PCP: Arnette Norris, MD  Admit date: 12/18/2013 Discharge date: 12/20/2013  Recommendations for Outpatient Follow-up:   Follow-up Information   Follow up with Gayland Curry, MD On 12/26/2013. (9:30 AM (arrive at 9 am))    Specialty:  General Surgery   Contact information:   [address] [place] [address] [phone]      Discharge Diagnoses:  Active Problems:   S/P laparoscopic sleeve gastrectomy morbid obesity  Surgical Procedure: Laparoscopic Sleeve Gastrectomy, upper endoscopy  Discharge Condition: Good Disposition: Home  Diet recommendation: Postoperative gastric bypass diet  Filed Weights   12/18/13 1100 12/19/13 1400  Weight: 195 lb 6.4 oz (88.633 kg) 199 lb (90.266 kg)     Hospital Course:  The patient was admitted for a planned laparoscopic sleeve gastrectomy. Please see operative note. Preoperatively the patient was given 5000 units of subcutaneous heparin for DVT prophylaxis. Postoperative prophylactic Lovenox dosing was started on the morning of postoperative day 1. The patient underwent an upper GI on postoperative day 1 which demonstrated no extravasation of contrast and emptying of the contrast into the small bowel. The patient was started on ice chips and water which they tolerated. On postoperative day 2 The patient's diet was advanced to protein shakes which they also tolerated. The patient was ambulating without difficulty. Their vital signs are stable without fever or tachycardia. Their hemoglobin had remained stable.  The patient had received discharge instructions and counseling. They were deemed stable for discharge.  BP 140/84  Pulse 81  Temp(Src) 98.3 F (36.8 C) (Oral)  Resp 16  Ht 4' 10.5" (1.486 m)  Wt 199 lb (90.266 kg)  BMI 40.88 kg/m2  SpO2 99%  Gen: alert, NAD, non-toxic appearing, looks good.  Pupils: equal, no scleral  icterus Pulm: Lungs clear to auscultation, symmetric chest rise CV: regular rate and rhythm Abd: soft, nontender, nondistended.  No cellulitis. No incisional hernia. Some bruising at incisions Ext: no edema, no calf tenderness Skin: no rash, no jaundice   Discharge Instructions  Discharge Instructions   Discharge instructions    Complete by:  As directed   See bariatric discharge instruction handout     Increase activity slowly    Complete by:  As directed             Medication List    STOP taking these medications       ASPIRIN LOW DOSE 81 MG tablet  Generic drug:  aspirin     ibuprofen 200 MG tablet  Commonly known as:  ADVIL,MOTRIN     naproxen sodium 220 MG tablet  Commonly known as:  ANAPROX      TAKE these medications       calcium-vitamin D 500-200 MG-UNIT per tablet  Commonly known as:  OSCAL WITH D  Take 2 tablets by mouth daily.     docusate sodium 100 MG capsule  Commonly known as:  COLACE  Take 100 mg by mouth 3 (three) times daily as needed for mild constipation.     fluticasone 50 MCG/ACT nasal spray  Commonly known as:  FLONASE  Place 2 sprays into both nostrils daily. USING EVERY AM FOR ALLERGIES     multivitamin capsule  Take 1 capsule by mouth daily.     ondansetron 4 MG tablet  Commonly known as:  ZOFRAN  Take 1 tablet (4 mg total) by mouth every 8 (eight) hours as needed for nausea or  vomiting.     oxyCODONE 5 MG/5ML solution  Commonly known as:  ROXICODONE  Take 5-10 mLs (5-10 mg total) by mouth every 4 (four) hours as needed for severe pain.     pantoprazole 40 MG tablet  Commonly known as:  PROTONIX  Take 1 tablet (40 mg total) by mouth daily.           Follow-up Information   Follow up with Gayland Curry, MD On 12/26/2013. (9:30 AM (arrive at 9 am))    Specialty:  General Surgery   Contact information:   [place] [address] [phone]        The results of significant diagnostics from  this hospitalization (including imaging, microbiology, ancillary and laboratory) are listed below for reference.    Significant Diagnostic Studies: Dg Ugi W/water Sol Cm  12/19/2013   CLINICAL DATA:  Patient status post gastric sleeve surgery.  EXAM: WATER SOLUBLE UPPER GI SERIES  TECHNIQUE: Single-column upper GI series was performed using water soluble contrast.  CONTRAST:  59mL OMNIPAQUE IOHEXOL 300 MG/ML  SOLN  COMPARISON:  None.  FLUOROSCOPY TIME:  3 min 45 seconds  FINDINGS: Patient drank 50 cc of water-soluble contrast material. Contrast material is demonstrated coursing through the distal esophagus into the postsurgical stomach. Contrast passed from the gastric fundus into the gastric antrum and proximal small bowel. Small amount of contrast remained within the gastric fundus however and passed on the subsequent delayed images. No evidence for leak from the postsurgical stomach.  IMPRESSION: No evidence for leak from the postsurgical stomach.   Electronically Signed   By: Lovey Newcomer M.D.   On: 12/19/2013 11:57    Labs: Basic Metabolic Panel:  Recent Labs Lab 12/19/13 0533  NA 142  K 4.4  CL 106  CO2 26  GLUCOSE 130*  BUN 8  CREATININE 0.60  CALCIUM 8.7   Liver Function Tests:  Recent Labs Lab 12/19/13 0533  AST 40*  ALT 37*  ALKPHOS 71  BILITOT <0.2*  PROT 6.4  ALBUMIN 3.6    CBC:  Recent Labs Lab 12/18/13 1855 12/19/13 0533 12/19/13 1547 12/20/13 0515  WBC  --  13.3*  --  12.1*  NEUTROABS  --  9.1*  --  7.2  HGB 13.4 12.6 12.7 11.8*  HCT 40.6 38.7 38.7 37.1  MCV  --  95.1  --  97.1  PLT  --  312  --  271    CBG: No results found for this basename: GLUCAP,  in the last 168 hours  Active Problems:   S/P laparoscopic sleeve gastrectomy   Time coordinating discharge: 15 minutes  Signed:  Gayland Curry, MD [facility], [place] [phone] 12/20/2013, 11:37 AM

## 2013-12-21 ENCOUNTER — Telehealth (HOSPITAL_COMMUNITY): Payer: Self-pay

## 2013-12-21 NOTE — Telephone Encounter (Signed)
Made discharge phone call to patient per DROP protocol. Asking the following questions.    1. Do you have someone to care for you now that you are home?  yes 2. Are you having pain now that is not relieved by your pain medication?  No  3. Are you able to drink the recommended daily amount of fluids (48 ounces minimum/day) and protein (60-80 grams/day) as prescribed by the dietitian or nutritional counselor?  yes 4. Are you taking the vitamins and minerals as prescribed?  Not yet, will try in a few days 5. Do you have the "on call" number to contact your surgeon if you have a problem or question?  yes 6. Are your incisions free of redness, swelling or drainage? (If steri strips, address that these can fall off, shower as tolerated) yes 7. Have your bowels moved since your surgery?  If not, are you passing gas?  No, yes 8. Are you up and walking 3-4 times per day?  yes    1. Do you have an appointment made to see your surgeon in the next month?  yes 2. Were you provided your discharge medications before your surgery or before you were discharged from the hospital and are you taking them without problem?  yes 3. Were you provided phone numbers to the clinic/surgeon's office?  yes 4. Did you watch the patient education video module in the (clinic, surgeon's office, etc.) before your surgery? yes 5. Do you have a discharge checklist that was provided to you in the hospital to reference with instructions on how to take care of yourself after surgery?  yes 6. Did you see a dietitian or nutritional counselor while you were in the hospital?  yes 7. Do you have an appointment to see a dietitian or nutritional counselor in the next month?  yes

## 2013-12-26 ENCOUNTER — Ambulatory Visit (INDEPENDENT_AMBULATORY_CARE_PROVIDER_SITE_OTHER): Payer: BC Managed Care – PPO | Admitting: General Surgery

## 2013-12-26 ENCOUNTER — Encounter: Payer: Self-pay | Admitting: Family Medicine

## 2013-12-27 ENCOUNTER — Telehealth: Payer: Self-pay | Admitting: *Deleted

## 2013-12-27 MED ORDER — NYSTATIN 100000 UNIT/GM EX POWD: Freq: Two times a day (BID) | CUTANEOUS | Status: DC

## 2013-12-27 NOTE — Telephone Encounter (Signed)
Pt requesting refill of nystatin powder. Med on pts Hx only. pls advise

## 2013-12-27 NOTE — Telephone Encounter (Signed)
Rx refilled.

## 2014-01-01 ENCOUNTER — Encounter: Payer: BC Managed Care – PPO | Attending: General Surgery

## 2014-01-01 DIAGNOSIS — Z713 Dietary counseling and surveillance: Secondary | ICD-10-CM | POA: Diagnosis not present

## 2014-01-01 DIAGNOSIS — Z01818 Encounter for other preprocedural examination: Secondary | ICD-10-CM | POA: Insufficient documentation

## 2014-01-01 NOTE — Patient Instructions (Signed)
Patient to follow Phase 3A-Soft, High Protein Diet and follow-up at [facility] in 6 weeks for 2 months post-op nutrition visit for diet advancement. 

## 2014-01-01 NOTE — Progress Notes (Signed)
Bariatric Class:  Appt start time: 1530 end time:  1630.  2 Week Post-Operative Nutrition Class  Patient was seen on 12/2913 for Post-Operative Nutrition [facility]   Surgery date: 12/18/13 Surgery type: Sleeve Gastrectomy Start weight at [facility]: 202 lbs on 10/13/2013 Weight today: 191.0 lbs  Weight change: 7 lbs  TANITA  BODY COMP RESULTS  11/05/13 01/01/14   BMI (kg/m^2) 40.7 39.2   Fat Mass (lbs) 100.5 94.0   Fat Free Mass (lbs) 97.5 97.0   Total Body Water (lbs) 71.5 71.0    The following the learning objectives were met by the patient during this course:  Identifies Phase 3A (Soft, High Proteins) Dietary Goals and will begin from 2 weeks post-operatively to 2 months post-operatively  Identifies appropriate sources of fluids and proteins   States protein recommendations and appropriate sources post-operatively  Identifies the need for appropriate texture modifications, mastication, and bite sizes when consuming solids  Identifies appropriate multivitamin and calcium sources post-operatively  Describes the need for physical activity post-operatively and will follow MD recommendations  States when to call healthcare provider regarding medication questions or post-operative complications  Handouts given during class include:  Phase 3A: Soft, High Protein Diet Handout  Follow-Up Plan: Patient will follow-up at [facility] in 6 weeks for 2 month post-op nutrition visit for diet advancement per MD.

## 2014-02-18 ENCOUNTER — Encounter: Payer: BC Managed Care – PPO | Attending: General Surgery | Admitting: Dietician

## 2014-02-18 VITALS — Ht 58.5 in | Wt 175.5 lb

## 2014-02-18 DIAGNOSIS — Z6836 Body mass index (BMI) 36.0-36.9, adult: Secondary | ICD-10-CM | POA: Diagnosis not present

## 2014-02-18 DIAGNOSIS — Z713 Dietary counseling and surveillance: Secondary | ICD-10-CM | POA: Insufficient documentation

## 2014-02-18 NOTE — Progress Notes (Signed)
  Follow-up visit:  8 Weeks Post-Operative Sleeve Surgery  Medical Nutrition Therapy:  Appt start time: 0915 end time:  0945.  Primary concerns today: Post-operative Bariatric Surgery Nutrition Management. Returns with a 15.5 lb weight loss. Has been tolerating everything ok. Very pleased with how things are going. Able to get in fluid better than before, though not yet meeting fluid goal.   Had a cold last week and did not work out but has been walking on the treadmill regularly. Would like to start the [facility] program and frustrated that she hasn't heard back from them.   Has tried some rice and potatoes which she tolerated.  Surgery date: 12/18/13 Surgery type: Sleeve Gastrectomy Start weight at [facility]: 202 lbs on 10/13/2013 Weight today: 175.5 lbs  Weight change: 15.5 lbs Total weight loss: 26.5 lbs Weight loss goal: 125 lbs  TANITA BODY COMP RESULTS  11/05/13 01/01/14 02/18/14  BMI (kg/m^2) 40.7 39.2 36.1  Fat Mass (lbs) 100.5 94.0 84.5  Fat Free Mass (lbs) 97.5 97.0 91.0  Total Body Water (lbs) 71.5 71.0 66.5    Preferred Learning Style:   No preference indicated   Learning Readiness:   Ready  24-hr recall: B (AM): Premier (most of the time) or egg (6-30 g) Snk (AM): Low fat food lion or Yoplait yogurt (not sure of carbs, ~7g) L (PM): deli meat [place] with cheese (14 g) Snk (PM): Yogurt if didn't have on in the morning, or part of protein shake (15 g) D (PM):baked chicken/roast beef with brown rice and green beans (7-14 g) Snk (PM): more protein shake or sugar free jello (0-15 g)  Fluid intake: 30-40 oz water with some Mio drops, 8 oz decaf coffee, 1-2 protein shakes (total fluid 30-40 oz) Estimated total protein intake: 41-90 g  Medications: see list  Supplementation: taking  Using straws: No Drinking while eating: No Hair loss: No Carbonated beverages: tried a sugar free Sprite and "it hurt" N/V/D/C: having chronic constipation and takes Miralax  almost daily Dumping syndrome: No  Recent physical activity:  Treadmill for 20-25 minutes, waiting to hear back from [facility] program to sign up  Progress Towards Goal(s):  In progress   Handouts given during visit include:  Phase 3B High Protein + Non Starchy Vegetables   Nutritional Diagnosis:  [place]-3.3 Overweight/obesity related to past poor dietary habits and physical inactivity as evidenced by patient w/ recent Sleeve Gastrectomy surgery following dietary guidelines for continued weight loss.    Intervention:  Nutrition education/diet advancement.  Goals:  Follow Phase 3B: High Protein + Non-Starchy Vegetables  Eat 3-6 small meals/snacks, every 3-5 hrs  Increase lean protein foods to meet 60g goal  Increase fluid intake to 64oz +  Avoid drinking 15 minutes before, during and 30 minutes after eating  Aim for >30 min of physical activity daily  Look for yogurt that has less than 12 g of carbs  Teaching Method Utilized:  Visual Auditory Hands on  Barriers to learning/adherence to lifestyle change: able to tolerate some carbs  Demonstrated degree of understanding via:  Teach Back   Monitoring/Evaluation:  Dietary intake, exercise, and body weight. Follow up in 1 months for 3 month post-op visit.

## 2014-02-18 NOTE — Patient Instructions (Addendum)
Goals:  Follow Phase 3B: High Protein + Non-Starchy Vegetables  Eat 3-6 small meals/snacks, every 3-5 hrs  Increase lean protein foods to meet 60g goal  Increase fluid intake to 64oz +  Avoid drinking 15 minutes before, during and 30 minutes after eating  Aim for >30 min of physical activity daily  Look for yogurt that has less than 12 g of carbs  Surgery date: 12/18/13 Surgery type: Sleeve Gastrectomy Start weight at [facility]: 202 lbs on 10/13/2013 Weight today: 175.5 lbs  Weight change: 15.5 lbs Total weight loss: 26.5 lbs Weight loss goal: 125 lbs  TANITA BODY COMP RESULTS  11/05/13 01/01/14 02/18/14  BMI (kg/m^2) 40.7 39.2 36.1  Fat Mass (lbs) 100.5 94.0 84.5  Fat Free Mass (lbs) 97.5 97.0 91.0  Total Body Water (lbs) 71.5 71.0 66.5

## 2014-03-19 ENCOUNTER — Encounter: Payer: Self-pay | Admitting: Family Medicine

## 2014-03-27 ENCOUNTER — Ambulatory Visit: Payer: BC Managed Care – PPO | Admitting: Dietician

## 2014-04-16 ENCOUNTER — Encounter: Payer: Self-pay | Admitting: Family Medicine

## 2014-04-29 ENCOUNTER — Encounter: Payer: Self-pay | Admitting: Family Medicine

## 2014-04-29 ENCOUNTER — Ambulatory Visit (INDEPENDENT_AMBULATORY_CARE_PROVIDER_SITE_OTHER): Payer: BC Managed Care – PPO | Admitting: Family Medicine

## 2014-04-29 ENCOUNTER — Ambulatory Visit (INDEPENDENT_AMBULATORY_CARE_PROVIDER_SITE_OTHER)
Admission: RE | Admit: 2014-04-29 | Discharge: 2014-04-29 | Disposition: A | Discharge status: Home / Self Care | Payer: BC Managed Care – PPO | Source: Ambulatory Visit | Attending: Family Medicine | Admitting: Family Medicine

## 2014-04-29 VITALS — BP 110/68 | HR 66 | Temp 97.5°F | Wt 162.5 lb

## 2014-04-29 DIAGNOSIS — M25561 Pain in right knee: Secondary | ICD-10-CM | POA: Insufficient documentation

## 2014-04-29 NOTE — Progress Notes (Signed)
Pre visit review using our clinic review tool, if applicable. No additional management support is needed unless otherwise documented below in the visit note. 

## 2014-04-29 NOTE — Patient Instructions (Signed)
Great to see you. You look fantastic.  We will call you with your xray results.

## 2014-04-29 NOTE — Assessment & Plan Note (Signed)
Improving and she is feeling and looking great! Encouraged her to continue diet and exercise.

## 2014-04-29 NOTE — Progress Notes (Signed)
Subjective:   Patient ID: [name] [name], female    DOB: 1951-01-01, 64 y.o.   MRN: [id number]  [name] [name] is a pleasant 64 y.o. year old female who presents to clinic today with Knee Pain  on 04/29/2014  HPI: Has been doing very well. S/p laparoscopic gastric sleeve in 12/2013.  Has been recovering very well and has already lost 43 pounds.  Over all feels great.  Her cardiovascular endurance is really improving. Since she has been exercising more, she has noticed her chronic right knee pain (ongoing for more than 7 years) has been getting worse rather than better.  Has fallen on her right knee recently but this did not worsen the pain.  Pain worse after she has been sitting for long periods of time or going up or down stairs.  No pain at rest, no swelling, erythema or warmth.  Current Outpatient Prescriptions on File Prior to Visit  Medication Sig Dispense Refill  . calcium-vitamin D (OSCAL WITH D) 500-200 MG-UNIT per tablet Take 2 tablets by mouth daily.    Marland Kitchen docusate sodium (COLACE) 100 MG capsule Take 100 mg by mouth 3 (three) times daily as needed for mild constipation.     . Multiple Vitamin (MULTIVITAMIN) capsule Take 1 capsule by mouth daily.      Marland Kitchen nystatin (MYCOSTATIN) powder Apply topically 2 (two) times daily. 60 g 1  . fluticasone (FLONASE) 50 MCG/ACT nasal spray Place 2 sprays into both nostrils daily. USING EVERY AM FOR ALLERGIES    . ondansetron (ZOFRAN) 4 MG tablet Take 1 tablet (4 mg total) by mouth every 8 (eight) hours as needed for nausea or vomiting. (Patient not taking: Reported on 04/29/2014) 30 tablet 0  . oxyCODONE (ROXICODONE) 5 MG/5ML solution Take 5-10 mLs (5-10 mg total) by mouth every 4 (four) hours as needed for severe pain. (Patient not taking: Reported on 04/29/2014) 200 mL 0  . pantoprazole (PROTONIX) 40 MG tablet Take 1 tablet (40 mg total) by mouth daily. (Patient not taking: Reported on 04/29/2014) 30 tablet 1   No current facility-administered  medications on file prior to visit.    Allergies  Allergen Reactions  . Morphine And Related     Nausea, dizziness, hypotension, hypoxia    Past Medical History  Diagnosis Date  . Vitamin D deficiency   . Strabismus     HAD STRABISMUS SURGERY 1956 - BUT STILL HAS STRABISMUS  . Blood transfusion without reported diagnosis     DURING PREGNANCY  . Coronary artery spasm 1990'S    AFTER OTC DIET PILLS - PT EXPERIENCED CHEST PAIN- PT HAD CARDIAC CATH - TOLD NO CAD - THOUGHT TO HAVE HAS CORNARY ARTERY SPASM  . Hypertension     220/104 IN SPRING 2015 - SAW HER DOCTOR - BUT B/P NORMALIZED - PT DECIDED TO STOP CAFFEINE AND HAS NOT HAD ANY ELEVATED PRESSURES SINCE JUNE  . H/O seasonal allergies   . Headache(784.0)     OCCAS SINUS HEADACHES  . Arthritis     LEFT FOOT; OCCAS LOWER BACK PAIN - HX OF VERTEBRAL FRACTURES  . Anemia     IN THE PAST WITH PREGNANCY  . Osteopenia   . Hearing impaired     LEFT EAR DEAFNESS    Past Surgical History  Procedure Laterality Date  . Appendectomy    . Abdominal hysterectomy    . Tonsillectomy    . Strabismus repair    . Repair tendons foot Right 1994    AND  RECONSTRUCTIVE SURGERY FOR MULTIPLE FRACTURES OF THE RIGHT FOOT  . Inner ear surgery Left 1965    RADICAL MASTOIDECTOMY  . Breath tek h pylori N/A 10/08/2013    Procedure: BREATH TEK H PYLORI;  Surgeon: Gayland Curry, MD;  Location: [facility];  Service: General;  Laterality: N/A;  . Breath tek h pylori N/A 12/03/2013    Procedure: BREATH TEK H PYLORI;  Surgeon: Gayland Curry, MD;  Location: [facility];  Service: General;  Laterality: N/A;  . Laparoscopic gastric sleeve resection N/A 12/18/2013    Procedure: LAPAROSCOPIC GASTRIC SLEEVE RESECTION;  Surgeon: Greer Pickerel, MD;  Location: [facility];  Service: General;  Laterality: N/A;    Family History  Problem Relation Age of Onset  . Hypertension Mother   . Diabetes Mother   . Obesity Mother   . Heart disease Mother   . COPD Other   .  Hyperlipidemia Other     History   Social History  . Marital Status: Married    Spouse Name: N/A    Number of Children: 2  . Years of Education: N/A   Occupational History  . 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 Smoking status: Never Smoker   . Smokeless tobacco: Never Used  . Alcohol Use: No  . Drug Use: No  . Sexual Activity: Not on file   Other Topics Concern  . 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  Review of Systems  Constitutional: Negative.   Cardiovascular: Negative.   Gastrointestinal: Negative.   Musculoskeletal: Negative for myalgias and joint swelling.  Skin: Negative.   Psychiatric/Behavioral: Negative.        Objective:    BP 110/68 mmHg  Pulse 66  Temp(Src) 97.5 F (36.4 C) (Oral)  Wt 162 lb 8 oz (73.71 kg)   Physical Exam  Constitutional: She is oriented to person, place, and time. She appears well-developed and well-nourished. No distress.  HENT:  Head: Normocephalic.  Eyes: Conjunctivae are normal.  Cardiovascular: Normal rate.   Pulmonary/Chest: Effort normal.  Musculoskeletal:       Right knee: She exhibits normal range of motion, no swelling, no effusion, no ecchymosis, no deformity, no laceration, no erythema, normal alignment, no bony tenderness and normal meniscus. Tenderness found. Patellar tendon tenderness noted. No medial joint line and no lateral joint line tenderness noted.  Neurological: She is alert and oriented to person, place, and time. No cranial nerve deficit.  Skin: Skin is warm and dry.  Psychiatric: She has a normal mood and affect. Her behavior is normal. Judgment and thought content normal.  Nursing note and vitals reviewed.         Assessment & Plan:   Right knee pain - Plan: DG Knee Complete 4 Views Right No Follow-up on  file.

## 2014-04-29 NOTE — Assessment & Plan Note (Signed)
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 Will get knee xray today. Given handout from sports med advisor discussing supportive care- ICE, elevation and exercises. May need ortho referral. The patient indicates understanding of these issues and agrees with the plan.

## 2014-05-30 ENCOUNTER — Ambulatory Visit (INDEPENDENT_AMBULATORY_CARE_PROVIDER_SITE_OTHER): Payer: BC Managed Care – PPO | Admitting: Family Medicine

## 2014-05-30 ENCOUNTER — Encounter: Payer: Self-pay | Admitting: Family Medicine

## 2014-05-30 VITALS — BP 112/74 | HR 91 | Temp 97.8°F | Ht 58.75 in | Wt 156.1 lb

## 2014-05-30 DIAGNOSIS — J069 Acute upper respiratory infection, unspecified: Secondary | ICD-10-CM

## 2014-05-30 MED ORDER — HYDROCODONE-HOMATROPINE 5-1.5 MG/5ML PO SYRP: 5.0000 mL | ORAL_SOLUTION | Freq: Three times a day (TID) | ORAL | Status: DC | PRN

## 2014-05-30 MED ORDER — AZITHROMYCIN 250 MG PO TABS: ORAL_TABLET | ORAL | Status: DC

## 2014-05-30 NOTE — Progress Notes (Signed)
SUBJECTIVE:  [name] [name] is a 64 y.o. female who complains of coryza, congestion, sneezing and productive cough for 4 weeks. She denies a history of anorexia, shortness of breath, vomiting and weakness and denies a history of asthma. Patient denies smoke cigarettes.  Current Outpatient Prescriptions on File Prior to Visit  Medication Sig Dispense Refill  . calcium-vitamin D (OSCAL WITH D) 500-200 MG-UNIT per tablet Take 2 tablets by mouth daily.    . Cyanocobalamin (VITAMIN B 12 PO) Take by mouth daily.    Marland Kitchen docusate sodium (COLACE) 100 MG capsule Take 100 mg by mouth 3 (three) times daily as needed for mild constipation.     . fluticasone (FLONASE) 50 MCG/ACT nasal spray Place 2 sprays into both nostrils daily. USING EVERY AM FOR ALLERGIES    . Multiple Vitamin (MULTIVITAMIN) capsule Take 1 capsule by mouth daily.      Marland Kitchen nystatin (MYCOSTATIN) powder Apply topically 2 (two) times daily. (Patient not taking: Reported on 05/30/2014) 60 g 1   No current facility-administered medications on file prior to visit.    Allergies  Allergen Reactions  . Morphine And Related     Nausea, dizziness, hypotension, hypoxia    Past Medical History  Diagnosis Date  . Vitamin D deficiency   . Strabismus     HAD STRABISMUS SURGERY 1956 - BUT STILL HAS STRABISMUS  . Blood transfusion without reported diagnosis     DURING PREGNANCY  . Coronary artery spasm 1990'S    AFTER OTC DIET PILLS - PT EXPERIENCED CHEST PAIN- PT HAD CARDIAC CATH - TOLD NO CAD - THOUGHT TO HAVE HAS CORNARY ARTERY SPASM  . Hypertension     220/104 IN SPRING 2015 - SAW HER DOCTOR - BUT B/P NORMALIZED - PT DECIDED TO STOP CAFFEINE AND HAS NOT HAD ANY ELEVATED PRESSURES SINCE JUNE  . H/O seasonal allergies   . Headache(784.0)     OCCAS SINUS HEADACHES  . Arthritis     LEFT FOOT; OCCAS LOWER BACK PAIN - HX OF VERTEBRAL FRACTURES  . Anemia     IN THE PAST WITH PREGNANCY  . Osteopenia   . Hearing impaired     LEFT EAR DEAFNESS     Past Surgical History  Procedure Laterality Date  . Appendectomy    . Abdominal hysterectomy    . Tonsillectomy    . Strabismus repair    . Repair tendons foot Right 1994    AND RECONSTRUCTIVE SURGERY FOR MULTIPLE FRACTURES OF THE RIGHT FOOT  . Inner ear surgery Left 1965    RADICAL MASTOIDECTOMY  . Breath tek h pylori N/A 10/08/2013    Procedure: BREATH TEK H PYLORI;  Surgeon: Gayland Curry, MD;  Location: [facility];  Service: General;  Laterality: N/A;  . Breath tek h pylori N/A 12/03/2013    Procedure: BREATH TEK H PYLORI;  Surgeon: Gayland Curry, MD;  Location: [facility];  Service: General;  Laterality: N/A;  . Laparoscopic gastric sleeve resection N/A 12/18/2013    Procedure: LAPAROSCOPIC GASTRIC SLEEVE RESECTION;  Surgeon: Greer Pickerel, MD;  Location: [facility];  Service: General;  Laterality: N/A;    Family History  Problem Relation Age of Onset  . Hypertension Mother   . Diabetes Mother   . Obesity Mother   . Heart disease Mother   . COPD Other   . Hyperlipidemia Other     History   Social History  . Marital Status: Married    Spouse Name: N/A  . Number  of Children: 2  . Years of Education: N/A   Occupational History  . 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 Smoking status: Never Smoker   . Smokeless tobacco: Never Used  . Alcohol Use: No  . Drug Use: No  . Sexual Activity: Not on file   Other Topics Concern  . 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  OBJECTIVE: BP 112/74 mmHg  Pulse 91  Temp(Src) 97.8 F (36.6 C) (Oral)  Ht 4' 10.75" (1.492 m)  Wt 156 lb 1.9 oz (70.816 kg)  BMI 31.81 kg/m2  SpO2 97%  She appears well, vital signs are as noted. Ears normal.  Throat and pharynx normal.  Neck supple. No adenopathy in the neck. Nose is congested. Sinuses non tender.   Right sided exp wheezes and scattered rhonchi  ASSESSMENT:  bronchitis  PLAN: Given duration and progression of symptoms, will treat for bacterial bronchitis with zpack, prn hycodan.  Symptomatic therapy suggested: push fluids, rest and return office visit prn if symptoms persist or worsen.Call or return to clinic prn if these symptoms worsen or fail to improve as anticipated.

## 2014-05-30 NOTE — Patient Instructions (Signed)
Great to see you.  You look fantastic. Please take zpack as directed, try to get some rest. Hycodan as needed (when not driving).

## 2014-05-30 NOTE — Progress Notes (Signed)
Pre visit review using our clinic review tool, if applicable. No additional management support is needed unless otherwise documented below in the visit note. 

## 2014-06-12 ENCOUNTER — Encounter (HOSPITAL_COMMUNITY): Payer: Self-pay

## 2014-06-12 ENCOUNTER — Emergency Department (INDEPENDENT_AMBULATORY_CARE_PROVIDER_SITE_OTHER)
Admission: EM | Admit: 2014-06-12 | Discharge: 2014-06-12 | Disposition: A | Discharge status: Home / Self Care | Payer: Self-pay | Source: Home / Self Care | Attending: Family Medicine | Admitting: Family Medicine

## 2014-06-12 DIAGNOSIS — M545 Low back pain, unspecified: Secondary | ICD-10-CM

## 2014-06-12 DIAGNOSIS — M25512 Pain in left shoulder: Secondary | ICD-10-CM

## 2014-06-12 MED ORDER — DICLOFENAC SODIUM 50 MG PO TBEC: 50.0000 mg | DELAYED_RELEASE_TABLET | Freq: Two times a day (BID) | ORAL | Status: DC | PRN
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
## 2014-06-12 NOTE — Discharge Instructions (Signed)
Thank you for coming in today. Come back or go to the emergency room if you notice new weakness new numbness problems walking or bowel or bladder problems.  Back Pain, Adult Low back pain is very common. About 1 in 5 people have back pain.The cause of low back pain is rarely dangerous. The pain often gets better over time.About half of people with a sudden onset of back pain feel better in just 2 weeks. About 8 in 10 people feel better by 6 weeks.  CAUSES Some common causes of back pain include:  Strain of the muscles or ligaments supporting the spine.  Wear and tear (degeneration) of the spinal discs.  Arthritis.  Direct injury to the back. DIAGNOSIS Most of the time, the direct cause of low back pain is not known.However, back pain can be treated effectively even when the exact cause of the pain is unknown.Answering your caregiver's questions about your overall health and symptoms is one of the most accurate ways to make sure the cause of your pain is not dangerous. If your caregiver needs more information, he or she may order lab work or imaging tests (X-rays or MRIs).However, even if imaging tests show changes in your back, this usually does not require surgery. HOME CARE INSTRUCTIONS For many people, back pain returns.Since low back pain is rarely dangerous, it is often a condition that people can learn to [employer] their own.   Remain active. It is stressful on the back to sit or stand in one place. Do not sit, drive, or stand in one place for more than 30 minutes at a time. Take short walks on level surfaces as soon as pain allows.Try to increase the length of time you walk each day.  Do not stay in bed.Resting more than 1 or 2 days can delay your recovery.  Do not avoid exercise or work.Your body is made to move.It is not dangerous to be active, even though your back may hurt.Your back will likely heal faster if you return to being active before your pain is gone.  Pay  attention to your body when you bend and lift. Many people have less discomfortwhen lifting if they bend their knees, keep the load close to their bodies,and avoid twisting. Often, the most comfortable positions are those that put less stress on your recovering back.  Find a comfortable position to sleep. Use a firm mattress and lie on your side with your knees slightly bent. If you lie on your back, put a pillow under your knees.  Only take over-the-counter or prescription medicines as directed by your caregiver. Over-the-counter medicines to reduce pain and inflammation are often the most helpful.Your caregiver may prescribe muscle relaxant drugs.These medicines help dull your pain so you can more quickly return to your normal activities and healthy exercise.  Put ice on the injured area.  Put ice in a plastic bag.  Place a towel between your skin and the bag.  Leave the ice on for 15-20 minutes, 03-04 times a day for the first 2 to 3 days. After that, ice and heat may be alternated to reduce pain and spasms.  Ask your caregiver about trying back exercises and gentle massage. This may be of some benefit.  Avoid feeling anxious or stressed.Stress increases muscle tension and can worsen back pain.It is important to recognize when you are anxious or stressed and learn ways to manage it.Exercise is a great option. SEEK MEDICAL CARE IF:  You have pain that is not  relieved with rest or medicine.  You have pain that does not improve in 1 week.  You have new symptoms.  You are generally not feeling well. SEEK IMMEDIATE MEDICAL CARE IF:   You have pain that radiates from your back into your legs.  You develop new bowel or bladder control problems.  You have unusual weakness or numbness in your arms or legs.  You develop nausea or vomiting.  You develop abdominal pain.  You feel faint. Document Released: 03/22/2005 Document Revised: 09/21/2011 Document Reviewed:  07/24/2013 [facility] Patient Information 2015 [place], [place]. This information is not intended to replace advice given to you by your health care provider. Make sure you discuss any questions you have with your health care provider.

## 2014-06-13 ENCOUNTER — Encounter: Payer: Self-pay | Admitting: Family Medicine

## 2014-07-16 ENCOUNTER — Encounter: Payer: Self-pay | Admitting: Family Medicine

## 2014-10-10 ENCOUNTER — Encounter: Payer: Self-pay | Admitting: Internal Medicine

## 2014-12-02 ENCOUNTER — Encounter: Payer: Self-pay | Admitting: Family Medicine

## 2014-12-18 ENCOUNTER — Ambulatory Visit (INDEPENDENT_AMBULATORY_CARE_PROVIDER_SITE_OTHER): Payer: BC Managed Care – PPO | Admitting: Family Medicine

## 2014-12-18 ENCOUNTER — Encounter: Payer: Self-pay | Admitting: Family Medicine

## 2014-12-18 VITALS — BP 112/68 | HR 75 | Temp 97.6°F | Ht <= 58 in | Wt 135.8 lb

## 2014-12-18 DIAGNOSIS — Z1159 Encounter for screening for other viral diseases: Secondary | ICD-10-CM

## 2014-12-18 DIAGNOSIS — G4700 Insomnia, unspecified: Secondary | ICD-10-CM

## 2014-12-18 DIAGNOSIS — Z0000 Encounter for general adult medical examination without abnormal findings: Secondary | ICD-10-CM

## 2014-12-18 DIAGNOSIS — Z9884 Bariatric surgery status: Secondary | ICD-10-CM

## 2014-12-18 DIAGNOSIS — Z23 Encounter for immunization: Secondary | ICD-10-CM | POA: Diagnosis not present

## 2014-12-18 DIAGNOSIS — Z114 Encounter for screening for human immunodeficiency virus [HIV]: Secondary | ICD-10-CM

## 2014-12-18 DIAGNOSIS — Z01419 Encounter for gynecological examination (general) (routine) without abnormal findings: Secondary | ICD-10-CM | POA: Insufficient documentation

## 2014-12-18 LAB — COMPREHENSIVE METABOLIC PANEL
ALK PHOS: 83 U/L (ref 39–117)
ALT: 19 U/L (ref 0–35)
AST: 22 U/L (ref 0–37)
Albumin: 4.5 g/dL (ref 3.5–5.2)
BUN: 14 mg/dL (ref 6–23)
CHLORIDE: 104 meq/L (ref 96–112)
CO2: 30 meq/L (ref 19–32)
Calcium: 9.3 mg/dL (ref 8.4–10.5)
Creatinine, Ser: 0.59 mg/dL (ref 0.40–1.20)
GFR: 109.56 mL/min (ref >60.00)
GLUCOSE: 84 mg/dL (ref 70–99)
POTASSIUM: 4 meq/L (ref 3.5–5.1)
SODIUM: 142 meq/L (ref 135–145)
TOTAL PROTEIN: 7 g/dL (ref 6.0–8.3)
Total Bilirubin: 0.4 mg/dL (ref 0.2–1.2)

## 2014-12-18 LAB — TSH: TSH: 1.87 u[IU]/mL (ref 0.35–4.50)

## 2014-12-18 LAB — CBC WITH DIFFERENTIAL/PLATELET
Basophils Absolute: 0 10*3/uL (ref 0.0–0.1)
Basophils Relative: 0.2 % (ref 0.0–3.0)
EOS PCT: 0.6 % (ref 0.0–5.0)
Eosinophils Absolute: 0.1 10*3/uL (ref 0.0–0.7)
HCT: 43.3 % (ref 36.0–46.0)
Hemoglobin: 14.7 g/dL (ref 12.0–15.0)
LYMPHS ABS: 4.7 10*3/uL — AB (ref 0.7–4.0)
Lymphocytes Relative: 56.1 % — ABNORMAL HIGH (ref 12.0–46.0)
MCHC: 33.9 g/dL (ref 30.0–36.0)
MCV: 95 fl (ref 78.0–100.0)
MONO ABS: 0.5 10*3/uL (ref 0.1–1.0)
Monocytes Relative: 6.3 % (ref 3.0–12.0)
Neutro Abs: 3.1 10*3/uL (ref 1.4–7.7)
Neutrophils Relative %: 36.8 % — ABNORMAL LOW (ref 43.0–77.0)
PLATELETS: 324 10*3/uL (ref 150.0–400.0)
RBC: 4.56 Mil/uL (ref 3.87–5.11)
RDW: 13.6 % (ref 11.5–15.5)
WBC: 8.3 10*3/uL (ref 4.0–10.5)

## 2014-12-18 LAB — LIPID PANEL
Cholesterol: 193 mg/dL (ref 0–200)
HDL: 64.5 mg/dL (ref >39.00)
LDL CALC: 107 mg/dL — AB (ref 0–99)
NONHDL: 128.91
Total CHOL/HDL Ratio: 3
Triglycerides: 111 mg/dL (ref 0.0–149.0)
VLDL: 22.2 mg/dL (ref 0.0–40.0)

## 2014-12-18 LAB — VITAMIN D 25 HYDROXY (VIT D DEFICIENCY, FRACTURES): VITD: 34.24 ng/mL (ref 30.00–100.00)

## 2014-12-18 NOTE — Patient Instructions (Signed)
Great to see you. You look fantastic!  We will call you with your lab results.  Check with your insurance to see if they will cover the shingles shot.

## 2014-12-18 NOTE — Addendum Note (Signed)
Addended by: Lucille Passy on: 12/18/2014 10:13 AM   Modules accepted: Miquel Dunn

## 2014-12-18 NOTE — Assessment & Plan Note (Addendum)
Reviewed preventive care protocols, scheduled due services, and updated immunizations Discussed nutrition, exercise, diet, and healthy lifestyle.  Influenza vaccine today.  She will call her insurance company regarding zostavax coverage.  Orders Placed This Encounter  Procedures  . Flu Vaccine QUAD 36+ mos PF IM (Fluarix & Fluzone Quad PF)  . CBC with Differential/Platelet  . Comprehensive metabolic panel  . Lipid panel  . TSH  . HIV antibody (with reflex)  . Hepatitis C Antibody

## 2014-12-18 NOTE — Progress Notes (Signed)
Subjective:   Patient ID: [name] [name], female    DOB: 26-Dec-1950, 64 y.o.   MRN: [id number]  [name] [name] is a pleasant 64 y.o. year old female who presents to clinic today with Annual Exam and Constipation  on 12/18/2014  HPI: Remote h/o hysterectomy but does still have cervix and ovaries.  Last pap smear 08/14/13.  No h/o abnormal pap smears.  Mammogram 11/29/14 Colonoscopy  2009 Carlean Purl)- received a letter asking her to reschedule this- she plans on doing that this week.  S/p lap band 12/2013.  Doing very well.  She is so pleased.  15 more pounds to lose and feels better than she has "in forty years."  Has had more constipation but prn dulcolax has been effective. Wt Readings from Last 3 Encounters:  12/18/14 135 lb 12 oz (61.576 kg)  05/30/14 156 lb 1.9 oz (70.816 kg)  04/29/14 162 lb 8 oz (73.71 kg)     Lab Results  Component Value Date   CHOL 201* 08/14/2013   HDL 48.50 08/14/2013   LDLCALC 115* 08/14/2013   LDLDIRECT 134.4 04/13/2011   TRIG 190.0* 08/14/2013   CHOLHDL 4 08/14/2013   Lab Results  Component Value Date   TSH 0.83 08/14/2013   Lab Results  Component Value Date   NA 142 12/19/2013   K 4.4 12/19/2013   CL 106 12/19/2013   CO2 26 12/19/2013    Patient Active Problem List   Diagnosis Date Noted  . Well woman exam 12/18/2014  . Right knee pain 04/29/2014  . S/P laparoscopic sleeve gastrectomy 12/18/2013  . Persistent disorder of initiating or maintaining sleep 11/05/2013  . Obesity, Class III, BMI 40-49.9 (morbid obesity) 09/12/2013  . Encounter for routine gynecological examination 08/14/2013  . Fluctuating blood pressure 06/28/2013  . Morbid obesity 06/28/2013  . CORONARY ARTERY SPASM 10/17/2009  . OSTEOPENIA 10/17/2009   Past Medical History  Diagnosis Date  . Vitamin D deficiency   . Strabismus     HAD STRABISMUS SURGERY 1956 - BUT STILL HAS STRABISMUS  . Blood transfusion without reported diagnosis     DURING PREGNANCY  . Coronary  artery spasm 1990'S    AFTER OTC DIET PILLS - PT EXPERIENCED CHEST PAIN- PT HAD CARDIAC CATH - TOLD NO CAD - THOUGHT TO HAVE HAS CORNARY ARTERY SPASM  . Hypertension     220/104 IN SPRING 2015 - SAW HER DOCTOR - BUT B/P NORMALIZED - PT DECIDED TO STOP CAFFEINE AND HAS NOT HAD ANY ELEVATED PRESSURES SINCE JUNE  . H/O seasonal allergies   . Headache(784.0)     OCCAS SINUS HEADACHES  . Arthritis     LEFT FOOT; OCCAS LOWER BACK PAIN - HX OF VERTEBRAL FRACTURES  . Anemia     IN THE PAST WITH PREGNANCY  . Osteopenia   . Hearing impaired     LEFT EAR DEAFNESS   Past Surgical History  Procedure Laterality Date  . Appendectomy    . Abdominal hysterectomy    . Tonsillectomy    . Strabismus repair    . Repair tendons foot Right 1994    AND RECONSTRUCTIVE SURGERY FOR MULTIPLE FRACTURES OF THE RIGHT FOOT  . Inner ear surgery Left 1965    RADICAL MASTOIDECTOMY  . Breath tek h pylori N/A 10/08/2013    Procedure: BREATH TEK H PYLORI;  Surgeon: Gayland Curry, MD;  Location: [facility];  Service: General;  Laterality: N/A;  . Breath tek h pylori N/A 12/03/2013    Procedure:  BREATH TEK H PYLORI;  Surgeon: Gayland Curry, MD;  Location: [facility];  Service: General;  Laterality: N/A;  . Laparoscopic gastric sleeve resection N/A 12/18/2013    Procedure: LAPAROSCOPIC GASTRIC SLEEVE RESECTION;  Surgeon: Greer Pickerel, MD;  Location: [facility];  Service: General;  Laterality: N/A;   Social History  Substance Use Topics  . Smoking status: Never Smoker   . Smokeless tobacco: Never Used  . Alcohol Use: No   Family History  Problem Relation Age of Onset  . Hypertension Mother   . Diabetes Mother   . Obesity Mother   . Heart disease Mother   . COPD Other   . Hyperlipidemia Other    Allergies  Allergen Reactions  . Morphine And Related     Nausea, dizziness, hypotension, hypoxia   Current Outpatient Prescriptions on File Prior to Visit  Medication Sig Dispense Refill  . calcium-vitamin D (OSCAL  WITH D) 500-200 MG-UNIT per tablet Take 2 tablets by mouth daily.    . Cyanocobalamin (VITAMIN B 12 PO) Take by mouth daily.    Marland Kitchen docusate sodium (COLACE) 100 MG capsule Take 100 mg by mouth 3 (three) times daily as needed for mild constipation.     . fluticasone (FLONASE) 50 MCG/ACT nasal spray Place 2 sprays into both nostrils daily. USING EVERY AM FOR ALLERGIES    . Multiple Vitamin (MULTIVITAMIN) capsule Take 1 capsule by mouth daily.       No current facility-administered medications on file prior to visit.   The PMH, PSH, Social History, Family History, Medications, and allergies have been reviewed in [facility], and have been updated if relevant.      Review of Systems  Constitutional: Negative.   HENT: Negative.   Respiratory: Negative.   Cardiovascular: Negative.   Gastrointestinal: Positive for constipation. Negative for abdominal pain, blood in stool, abdominal distention and anal bleeding.  Endocrine: Negative.   Genitourinary: Negative.   Musculoskeletal: Negative.   Skin: Negative.   Allergic/Immunologic: Negative.   Neurological: Negative.   Hematological: Negative.   Psychiatric/Behavioral: Negative.   All other systems reviewed and are negative.      Objective:    BP 112/68 mmHg  Pulse 75  Temp(Src) 97.6 F (36.4 C) (Oral)  Ht 4\' 10"  (1.473 m)  Wt 135 lb 12 oz (61.576 kg)  BMI 28.38 kg/m2  SpO2 97%  Wt Readings from Last 3 Encounters:  12/18/14 135 lb 12 oz (61.576 kg)  05/30/14 156 lb 1.9 oz (70.816 kg)  04/29/14 162 lb 8 oz (73.71 kg)     Physical Exam   General:  Well-developed,well-nourished,in no acute distress; alert,appropriate and cooperative throughout examination Head:  normocephalic and atraumatic.   Eyes:  vision grossly intact, pupils equal, pupils round, and pupils reactive to light.   Ears:  R ear normal and L ear normal.   Nose:  no external deformity.   Mouth:  good dentition.   Neck:  No deformities, masses, or tenderness  noted. Breasts:  No mass, nodules, thickening, tenderness, bulging, retraction, inflamation, nipple discharge or skin changes noted.   Lungs:  Normal respiratory effort, chest expands symmetrically. Lungs are clear to auscultation, no crackles or wheezes. Heart:  Normal rate and regular rhythm. S1 and S2 normal without gallop, murmur, click, rub or other extra sounds. Abdomen:  Bowel sounds positive,abdomen soft and non-tender without masses, organomegaly or hernias noted. Msk:  No deformity or scoliosis noted of thoracic or lumbar spine.   Extremities:  No clubbing, cyanosis,  edema, or deformity noted with normal full range of motion of all joints.   Neurologic:  alert & oriented X3 and gait normal.   Skin:  Intact without suspicious lesions or rashes Cervical Nodes:  No lymphadenopathy noted Axillary Nodes:  No palpable lymphadenopathy Psych:  Cognition and judgment appear intact. Alert and cooperative with normal attention span and concentration. No apparent delusions, illusions, hallucinations       Assessment & Plan:   Well woman exam - Plan: CBC with Differential/Platelet, Comprehensive metabolic panel, Lipid panel, TSH  S/P laparoscopic sleeve gastrectomy  Persistent disorder of initiating or maintaining sleep  Screening for HIV (human immunodeficiency virus) - Plan: HIV antibody (with reflex)  Need for hepatitis C screening test - Plan: Hepatitis C Antibody  Need for prophylactic vaccination and inoculation against cholera alone - Plan: Flu Vaccine QUAD 36+ mos PF IM (Fluarix & Fluzone Quad PF) No Follow-up on file.

## 2014-12-18 NOTE — Addendum Note (Signed)
Addended by: Lucille Passy on: 12/18/2014 09:51 AM   Modules accepted: Orders, SmartSet

## 2014-12-18 NOTE — Progress Notes (Signed)
Pre visit review using our clinic review tool, if applicable. No additional management support is needed unless otherwise documented below in the visit note. 

## 2014-12-19 ENCOUNTER — Encounter: Payer: Self-pay | Admitting: Family Medicine

## 2014-12-19 LAB — HEPATITIS C ANTIBODY: HCV Ab: NEGATIVE

## 2014-12-19 LAB — HIV ANTIBODY (ROUTINE TESTING W REFLEX): HIV 1&2 Ab, 4th Generation: NONREACTIVE

## 2015-03-10 ENCOUNTER — Encounter: Payer: Self-pay | Admitting: Family Medicine

## 2015-03-10 ENCOUNTER — Ambulatory Visit (INDEPENDENT_AMBULATORY_CARE_PROVIDER_SITE_OTHER): Payer: BC Managed Care – PPO | Admitting: Family Medicine

## 2015-03-10 VITALS — BP 118/70 | HR 70 | Temp 98.2°F | Ht <= 58 in | Wt 138.8 lb

## 2015-03-10 DIAGNOSIS — N390 Urinary tract infection, site not specified: Secondary | ICD-10-CM | POA: Diagnosis not present

## 2015-03-10 LAB — POCT URINALYSIS DIP (MANUAL ENTRY)
Bilirubin, UA: NEGATIVE
Glucose, UA: NEGATIVE
Ketones, POC UA: NEGATIVE
NITRITE UA: POSITIVE — AB
Protein Ur, POC: 100 — AB
Spec Grav, UA: 1.025
UROBILINOGEN UA: 0.2
pH, UA: 6

## 2015-03-10 MED ORDER — SULFAMETHOXAZOLE-TRIMETHOPRIM 800-160 MG PO TABS: 1.0000 | ORAL_TABLET | Freq: Two times a day (BID) | ORAL | Status: DC

## 2015-03-10 NOTE — Assessment & Plan Note (Addendum)
Treat with short course of antibiotics.  Push fluids.  Eval with urine culture for sensitivities.

## 2015-03-10 NOTE — Addendum Note (Signed)
Addended by: Carter Kitten on: 03/10/2015 10:26 AM   Modules accepted: Orders

## 2015-03-10 NOTE — Progress Notes (Signed)
   Subjective:    Patient ID: [name] [name], female    DOB: 05-09-1950, 64 y.o.   MRN: [id number]  Urinary Tract Infection  This is a new problem. The current episode started in the past 7 days (positive home test for leukocytes.). The problem occurs every urination. The problem has been gradually worsening. The quality of the pain is described as burning. The pain is moderate. There has been no fever. She is not sexually active. There is no history of pyelonephritis. Associated symptoms include frequency and urgency. Pertinent negatives include no flank pain, hematuria, nausea or vomiting. Associated symptoms comments: Urinary pressure, fatigue, headache.. She has tried increased fluids for the symptoms. The treatment provided mild relief. There is no history of catheterization, kidney stones, recurrent UTIs, a single kidney, urinary stasis or a urological procedure.    Social History /Family History/Past Medical History reviewed and updated if needed.   Review of Systems  Gastrointestinal: Negative for nausea and vomiting.  Genitourinary: Positive for urgency and frequency. Negative for hematuria and flank pain.       Objective:   Physical Exam  Constitutional: Vital signs are normal. She appears well-developed and well-nourished. She is cooperative.  Non-toxic appearance. She does not appear ill. No distress.  HENT:  Head: Normocephalic.  Right Ear: Hearing, tympanic membrane, external ear and ear canal normal. Tympanic membrane is not erythematous, not retracted and not bulging.  Left Ear: Hearing, tympanic membrane, external ear and ear canal normal. Tympanic membrane is not erythematous, not retracted and not bulging.  Nose: No mucosal edema or rhinorrhea. Right sinus exhibits no maxillary sinus tenderness and no frontal sinus tenderness. Left sinus exhibits no maxillary sinus tenderness and no frontal sinus tenderness.  Mouth/Throat: Uvula is midline, oropharynx is clear and moist  and mucous membranes are normal.  Eyes: Conjunctivae, EOM and lids are normal. Pupils are equal, round, and reactive to light. Lids are everted and swept, no foreign bodies found.  Neck: Trachea normal and normal range of motion. Neck supple. Carotid bruit is not present. No thyroid mass and no thyromegaly present.  Cardiovascular: Normal rate, regular rhythm, S1 normal, S2 normal, normal heart sounds, intact distal pulses and normal pulses.  Exam reveals no gallop and no friction rub.   No murmur heard. Pulmonary/Chest: Effort normal and breath sounds normal. No tachypnea. No respiratory distress. She has no decreased breath sounds. She has no wheezes. She has no rhonchi. She has no rales.  Abdominal: Soft. Normal appearance and bowel sounds are normal. There is tenderness in the suprapubic area. There is no rigidity, no guarding and no CVA tenderness.  Neurological: She is alert.  Skin: Skin is warm, dry and intact. No rash noted.  Psychiatric: Her speech is normal and behavior is normal. Judgment and thought content normal. Her mood appears not anxious. Cognition and memory are normal. She does not exhibit a depressed mood.          Assessment & Plan:

## 2015-03-10 NOTE — Progress Notes (Signed)
Pre visit review using our clinic review tool, if applicable. No additional management support is needed unless otherwise documented below in the visit note. 

## 2015-03-10 NOTE — Patient Instructions (Signed)
Complete antibiotics.  Push fluids.  Call if not improving or fever on antibiotics.  We will call with urine culture results.

## 2015-03-13 LAB — URINE CULTURE: Colony Count: >=100000

## 2015-04-06 HISTORY — PX: EYE SURGERY: SHX253

## 2015-04-17 ENCOUNTER — Ambulatory Visit (INDEPENDENT_AMBULATORY_CARE_PROVIDER_SITE_OTHER): Payer: BC Managed Care – PPO | Admitting: Internal Medicine

## 2015-04-17 ENCOUNTER — Encounter: Payer: Self-pay | Admitting: Internal Medicine

## 2015-04-17 VITALS — BP 112/76 | HR 69 | Temp 98.0°F | Wt 133.5 lb

## 2015-04-17 DIAGNOSIS — H6981 Other specified disorders of Eustachian tube, right ear: Secondary | ICD-10-CM

## 2015-04-17 DIAGNOSIS — J309 Allergic rhinitis, unspecified: Secondary | ICD-10-CM

## 2015-04-17 MED ORDER — HYDROCODONE-HOMATROPINE 5-1.5 MG/5ML PO SYRP: 5.0000 mL | ORAL_SOLUTION | Freq: Three times a day (TID) | ORAL | Status: DC | PRN

## 2015-04-17 NOTE — Patient Instructions (Signed)

## 2015-04-17 NOTE — Progress Notes (Signed)
HPI  Pt presents to the clinic today with c/o runny nose, nasal congestion, sore throat and right ear fullness. This started 2 weeks ago. She is blowing clear mucous out of her nose. The cough is nonproductive. She denies fever, chills or body aches. She has tried Mucinex and Dayquil without any relief. She is concerned because she is having trouble hearing out of her right ear and she is deaf in her left ear. She also has upcoming cataract surgery in 2 weeks. She has no history of allergies or breathing problems. She may have had sick contacts but is not positive.  Review of Systems    Past Medical History  Diagnosis Date  . Vitamin D deficiency   . Strabismus     HAD STRABISMUS SURGERY 1956 - BUT STILL HAS STRABISMUS  . Blood transfusion without reported diagnosis     DURING PREGNANCY  . Coronary artery spasm ([facility]) 1990'S    AFTER OTC DIET PILLS - PT EXPERIENCED CHEST PAIN- PT HAD CARDIAC CATH - TOLD NO CAD - THOUGHT TO HAVE HAS CORNARY ARTERY SPASM  . Hypertension     220/104 IN SPRING 2015 - SAW HER DOCTOR - BUT B/P NORMALIZED - PT DECIDED TO STOP CAFFEINE AND HAS NOT HAD ANY ELEVATED PRESSURES SINCE JUNE  . H/O seasonal allergies   . Headache(784.0)     OCCAS SINUS HEADACHES  . Arthritis     LEFT FOOT; OCCAS LOWER BACK PAIN - HX OF VERTEBRAL FRACTURES  . Anemia     IN THE PAST WITH PREGNANCY  . Osteopenia   . Hearing impaired     LEFT EAR DEAFNESS    Family History  Problem Relation Age of Onset  . Hypertension Mother   . Diabetes Mother   . Obesity Mother   . Heart disease Mother   . COPD Other   . Hyperlipidemia Other     Social History   Social History  . Marital Status: Married    Spouse Name: N/A  . Number of Children: 2  . Years of Education: N/A   Occupational History  . 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 Smoking status: Never Smoker   . Smokeless tobacco: Never Used  . Alcohol Use: No  . Drug Use: No  . Sexual Activity:  Not on file   Other Topics Concern  . Not on file   Social History Narrative   RN faculty at [employer]    Allergies  Allergen Reactions  . Morphine And Related     Nausea, dizziness, hypotension, hypoxia     Constitutional: Denies headache, fatigue, fever or abrupt weight changes.  HEENT:  Positive runny nose, nasal congestion and sore throat. Denies eye redness, ear pain, ringing in the ears, wax buildup, or bloody nose. Respiratory: Positive cough. Denies difficulty breathing or shortness of breath.  Cardiovascular: Denies chest pain, chest tightness, palpitations or swelling in the hands or feet.   No other specific complaints in a complete review of systems (except as listed in HPI above).  Objective:  BP 112/76 mmHg  Pulse 69  Temp(Src) 98 F (36.7 C) (Oral)  Wt 133 lb 8 oz (60.555 kg)  SpO2 98%   General: Appears her stated age, well developed, well nourished in NAD. HEENT: Head: normal shape and size, no sinus tenderness noted; Eyes: sclera white, no icterus, conjunctiva pink; Right Ears: unable to visualize TM d/t narrow, curved canal; Left Ear: TM distorted due  prior surgical intervention. Throat/Mouth: Teeth present, mucosa pink and moist, no exudate noted, no lesions or ulcerations noted.  Neck:  No adenopathy noted. Cardiovascular: Normal rate and rhythm. S1,S2 noted.  No murmur, rubs or gallops noted.  Pulmonary/Chest: Normal effort and positive vesicular breath sounds. No respiratory distress. No wheezes, rales or ronchi noted.      Assessment & Plan:   Allergic Rhinitis with ETD right:  Can use a Neti Pot which can be purchased from your local drug store. Flonase 2 sprays each nostril for 3 days and then as needed. If persist, will consider short course of Prednisone  RTC as needed or if symptoms persist.

## 2015-04-17 NOTE — Progress Notes (Signed)
Pre visit review using our clinic review tool, if applicable. No additional management support is needed unless otherwise documented below in the visit note. 

## 2015-04-22 ENCOUNTER — Encounter: Payer: Self-pay | Admitting: Family Medicine

## 2015-11-20 ENCOUNTER — Ambulatory Visit (HOSPITAL_COMMUNITY)
Admission: EM | Admit: 2015-11-20 | Discharge: 2015-11-20 | Disposition: A | Discharge status: Home / Self Care | Payer: BC Managed Care – PPO | Attending: Family Medicine | Admitting: Family Medicine

## 2015-11-20 ENCOUNTER — Encounter (HOSPITAL_COMMUNITY): Payer: Self-pay | Admitting: Emergency Medicine

## 2015-11-20 ENCOUNTER — Ambulatory Visit (INDEPENDENT_AMBULATORY_CARE_PROVIDER_SITE_OTHER): Payer: BC Managed Care – PPO

## 2015-11-20 ENCOUNTER — Telehealth: Payer: Self-pay | Admitting: Family Medicine

## 2015-11-20 DIAGNOSIS — K5901 Slow transit constipation: Secondary | ICD-10-CM | POA: Diagnosis not present

## 2015-11-20 DIAGNOSIS — R1030 Lower abdominal pain, unspecified: Secondary | ICD-10-CM | POA: Diagnosis not present

## 2015-11-20 LAB — POCT URINALYSIS DIP (DEVICE)
BILIRUBIN URINE: NEGATIVE
Glucose, UA: NEGATIVE mg/dL
HGB URINE DIPSTICK: NEGATIVE
Ketones, ur: NEGATIVE mg/dL
Nitrite: NEGATIVE
PH: 5.5 (ref 5.0–8.0)
Protein, ur: NEGATIVE mg/dL
SPECIFIC GRAVITY, URINE: 1.015 (ref 1.005–1.030)
Urobilinogen, UA: 0.2 mg/dL (ref 0.0–1.0)

## 2015-11-20 NOTE — ED Triage Notes (Signed)
Patient in left side of abdomen initially occurred on Tuesday.  Pain is left lower abdomen and sometimes radiates around to left back.  Patient has chronic constipation issues.  Took two colace on Wednesday, with some results and pain resolved.  Wednesday pain reoccurred.  Took colace again with some results and pain resolved.  Today pain has reoccurred .  This time noted fever of 99.5   Patient too naproxen prior to coming to [facility]

## 2015-11-20 NOTE — Telephone Encounter (Signed)
Noted. Thanks.  Routed to PCP as FYI.  

## 2015-11-20 NOTE — Telephone Encounter (Signed)
Pt called back and pt never has a fever and now her temp is 99.5 so pt has decided she is going to go to [facility] now but pt is not going to cancel 11/21/15 appt until after seen at [facility]. FYI to Dr Damita Dunnings.

## 2015-11-20 NOTE — ED Provider Notes (Signed)
CSN: [id number]     Arrival date & time 11/20/15  1429 History   First MD Initiated Contact with Patient 11/20/15 1507     Chief Complaint  Patient presents with  . Abdominal Pain   (Consider location/radiation/quality/duration/timing/severity/associated sxs/prior Treatment) 65 year old female complaining of left lower quadrant pain for 2 days. The pain comes and goes. She describes it as sharp and severe at times. It was worse 2 days ago. She took some Colace and the pain was a little better the following day. Yesterday she felt better in general until the pain returned later on during the day. She took Naprosyn and Percocet which also helped. Pain occurs when drinking water but not food. She has been drinking protein shakes and peanut butter crackers. Today she has had some abdominal aching but the severe pain has not returned. She also notes that earlier in the day she had a temperature of 99.5 but that since has returned to normal.  She has a history of chronic constipation but states this does not feel the same way as previous discomfort associated with constipation.  She had a small bowel movement yesterday and the day before. Did not feel like she was completely empty.  No history of known bowel disease, surgery, autoimmune, diverticulitis or other with the exception of chronic constipation.      Past Medical History:  Diagnosis Date  . Anemia    IN THE PAST WITH PREGNANCY  . Arthritis    LEFT FOOT; OCCAS LOWER BACK PAIN - HX OF VERTEBRAL FRACTURES  . Blood transfusion without reported diagnosis    DURING PREGNANCY  . Coronary artery spasm (HCC) 1990'S   AFTER OTC DIET PILLS - PT EXPERIENCED CHEST PAIN- PT HAD CARDIAC CATH - TOLD NO CAD - THOUGHT TO HAVE HAS CORNARY ARTERY SPASM  . H/O seasonal allergies   . Headache(784.0)    OCCAS SINUS HEADACHES  . Hearing impaired    LEFT EAR DEAFNESS  . Hypertension    220/104 IN SPRING 2015 - SAW HER DOCTOR - BUT B/P NORMALIZED - PT  DECIDED TO STOP CAFFEINE AND HAS NOT HAD ANY ELEVATED PRESSURES SINCE JUNE  . Osteopenia   . Strabismus    HAD STRABISMUS SURGERY 1956 - BUT STILL HAS STRABISMUS  . Vitamin D deficiency    Past Surgical History:  Procedure Laterality Date  . ABDOMINAL HYSTERECTOMY    . APPENDECTOMY    . BREATH TEK H PYLORI N/A 10/08/2013   Procedure: BREATH TEK H PYLORI;  Surgeon: Gayland Curry, MD;  Location: [facility];  Service: General;  Laterality: N/A;  . BREATH TEK H PYLORI N/A 12/03/2013   Procedure: BREATH TEK Kandis Ban;  Surgeon: Gayland Curry, MD;  Location: [facility];  Service: General;  Laterality: N/A;  . INNER EAR [place]  . LAPAROSCOPIC GASTRIC SLEEVE RESECTION N/A 12/18/2013   Procedure: LAPAROSCOPIC GASTRIC SLEEVE RESECTION;  Surgeon: Greer Pickerel, MD;  Location: [facility];  Service: General;  Laterality: N/A;  . REPAIR TENDONS FOOT Right 1994   AND RECONSTRUCTIVE SURGERY FOR MULTIPLE FRACTURES OF THE RIGHT FOOT  . strabismus repair    . TONSILLECTOMY     Family History  Problem Relation Age of Onset  . Hypertension Mother   . Diabetes Mother   . Obesity Mother   . Heart disease Mother   . COPD Other   . Hyperlipidemia Other    Social History  Substance Use Topics  . Smoking  status: Never Smoker  . Smokeless tobacco: Never Used  . Alcohol use No   OB History    No data available     Review of Systems  Constitutional: Positive for activity change. Negative for chills.  HENT: Negative.   Eyes: Negative.   Respiratory: Negative.  Negative for chest tightness and shortness of breath.   Cardiovascular: Negative for chest pain.  Gastrointestinal: Positive for abdominal pain and constipation. Negative for abdominal distention, blood in stool, diarrhea, nausea and vomiting.  Genitourinary: Negative.   Skin: Negative for rash.  Neurological: Negative.   All other systems reviewed and are negative.   Allergies  Morphine and related  Home  Medications   Prior to Admission medications   Medication Sig Start Date End Date Taking? Authorizing Provider  bisacodyl (BISACODYL) 5 MG EC tablet Take 5 mg by mouth daily as needed for moderate constipation.    Historical Provider, MD  calcium-vitamin D (OSCAL WITH D) 500-200 MG-UNIT per tablet Take 2 tablets by mouth daily.    Historical Provider, MD  Cyanocobalamin (VITAMIN B 12 PO) Take by mouth daily.    Historical Provider, MD  docusate sodium (COLACE) 100 MG capsule Take 100 mg by mouth 3 (three) times daily as needed for mild constipation.     Historical Provider, MD  fluticasone (FLONASE) 50 MCG/ACT nasal spray Place 2 sprays into both nostrils daily. Reported on 04/17/2015    Historical Provider, MD  Multiple Vitamin (MULTIVITAMIN) capsule Take 1 capsule by mouth daily.      Historical Provider, MD   Meds Ordered and Administered this Visit  Medications - No data to display  BP 133/78 (BP Location: Left Arm)   Pulse 100   Temp 97.8 F (36.6 C) (Oral)   Resp 18   SpO2 98%  No data found.   Physical Exam  Constitutional: She is oriented to person, place, and time. She appears well-developed and well-nourished. No distress.  HENT:  Head: Normocephalic and atraumatic.  Eyes: EOM are normal.  Neck: Normal range of motion. Neck supple.  Cardiovascular: Normal rate.   Pulmonary/Chest: Effort normal. No respiratory distress.  Abdominal: Soft. Bowel sounds are normal. She exhibits no distension and no mass. There is no rebound and no guarding.  No tenderness to the lower quadrants of the abdomen. No tenderness to the right upper quadrant. Initial examination demonstrated no acute pain while palpating the far upper left quadrant. Repeat exam 1-2 minutes later showed no tenderness. Abdomen was examined twice.  Musculoskeletal: She exhibits no edema.  Neurological: She is alert and oriented to person, place, and time. She exhibits normal muscle tone.  Skin: Skin is warm and dry.   Psychiatric: She has a normal mood and affect.  Nursing note and vitals reviewed.   Urgent Care Course   Clinical Course    Procedures (including critical care time)  Labs Review Labs Reviewed  POCT URINALYSIS DIP (DEVICE) - Abnormal; Notable for the following:       Result Value   Leukocytes, UA SMALL (*)    All other components within normal limits   Results for orders placed or performed during the hospital encounter of 11/20/15  POCT urinalysis dip (device)  Result Value Ref Range   Glucose, UA NEGATIVE NEGATIVE mg/dL   Bilirubin Urine NEGATIVE NEGATIVE   Ketones, ur NEGATIVE NEGATIVE mg/dL   Specific Gravity, Urine 1.015 1.005 - 1.030   Hgb urine dipstick NEGATIVE NEGATIVE   pH 5.5 5.0 - 8.0  Protein, ur NEGATIVE NEGATIVE mg/dL   Urobilinogen, UA 0.2 0.0 - 1.0 mg/dL   Nitrite NEGATIVE NEGATIVE   Leukocytes, UA SMALL (A) NEGATIVE     Imaging Review Dg Abd 2 Views  Result Date: 11/20/2015 CLINICAL DATA:  Left lower quadrant pain.  Onset 2 days ago. EXAM: ABDOMEN - 2 VIEW COMPARISON:  12/19/2013 FINDINGS: Multiple surgical clips in the left upper abdomen. Nonobstructive bowel gas pattern. Moderate amount of stool in the colon. Lung bases are clear. No evidence for free air. No acute bone abnormality. IMPRESSION: Postsurgical changes in the abdomen. Nonobstructive bowel gas pattern. Moderate stool burden. Electronically Signed   By: Markus Daft M.D.   On: 11/20/2015 15:43     Visual Acuity Review  Right Eye Distance:   Left Eye Distance:   Bilateral Distance:    Right Eye Near:   Left Eye Near:    Bilateral Near:         MDM   1. Slow transit constipation   2. Lower abdominal pain   abd exam unremarkable. No air fluid levels or obstruction. Currently no pain. Stable.  Pt agreed to use Miralax 3 glasses today, and repeat tomorrow prn Enema if necessary. Increase fiber in diet and more liquids. See your PCP as needed    Janne Napoleon, NP 11/20/15  1815

## 2015-11-20 NOTE — Telephone Encounter (Signed)
Patient Name: [name] [name] DOB: [birth year] Initial Comment Caller states she has been experiencing left side lower abdominal pain off and on for 3d - various levels of intensity. Very sudden - no n/v, pain has gone into back at times and can be breath taking. Today it is a dull ache. Nurse Assessment Nurse: Vallery Sa, RN, Cathy Date/Time (Eastern Time): 11/20/2015 12:23:40 PM Confirm and document reason for call. If symptomatic, describe symptoms. You must click the next button to save text entered. ---Caller states she developed left lower abdominal pain two days ago (rated as a 3 on the 1 to 10 scale). No injury in the past 3 days. No fever. Alert and responsive. No vomiting or diarrhea. Has the patient traveled out of the country within the last 30 days? ---No Does the patient have any new or worsening symptoms? ---Yes Will a triage be completed? ---Yes Related visit to physician within the last 2 weeks? ---No Does the PT have any chronic conditions? (i.e. diabetes, asthma, etc.) ---Yes List chronic conditions. ---Constipation in the past Is this a behavioral health or substance abuse call? 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 She states she will go to an urgent care facility, but would prefer to be seen at the [facility] office. Called the office backline and Reena will assist her further. Disagree/Comply: Disagree Disagree/Comply Reason: Disagree with instructions

## 2015-11-20 NOTE — Telephone Encounter (Signed)
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 Pt said pain level now is 3. Offered pt appt at another [facility] site but pt did not want to go that far and pt scheduled appt on 11/21/15 at 10:45 with Dr Damita Dunnings. If pt condition changes or worsens prior to appt she will go to [facility].FYI to Dr Damita Dunnings.

## 2015-11-21 ENCOUNTER — Ambulatory Visit: Payer: BC Managed Care – PPO | Admitting: Family Medicine

## 2015-11-25 ENCOUNTER — Ambulatory Visit (INDEPENDENT_AMBULATORY_CARE_PROVIDER_SITE_OTHER): Payer: Self-pay | Admitting: Family Medicine

## 2015-11-25 ENCOUNTER — Encounter: Payer: Self-pay | Admitting: Family Medicine

## 2015-11-25 ENCOUNTER — Ambulatory Visit (INDEPENDENT_AMBULATORY_CARE_PROVIDER_SITE_OTHER)
Admission: RE | Admit: 2015-11-25 | Discharge: 2015-11-25 | Disposition: A | Discharge status: Home / Self Care | Payer: BC Managed Care – PPO | Source: Ambulatory Visit | Attending: Family Medicine | Admitting: Family Medicine

## 2015-11-25 ENCOUNTER — Telehealth: Payer: Self-pay | Admitting: Family Medicine

## 2015-11-25 VITALS — BP 132/62 | HR 79 | Temp 98.2°F | Wt 138.2 lb

## 2015-11-25 DIAGNOSIS — R319 Hematuria, unspecified: Secondary | ICD-10-CM

## 2015-11-25 DIAGNOSIS — R1011 Right upper quadrant pain: Secondary | ICD-10-CM | POA: Insufficient documentation

## 2015-11-25 DIAGNOSIS — R1032 Left lower quadrant pain: Secondary | ICD-10-CM

## 2015-11-25 MED ORDER — CIPROFLOXACIN HCL 500 MG PO TABS: 500.0000 mg | ORAL_TABLET | Freq: Two times a day (BID) | ORAL | 0 refills | Status: DC

## 2015-11-25 NOTE — Telephone Encounter (Signed)
Imaging called to advise pt was seen this morning and the report is up.

## 2015-11-25 NOTE — Telephone Encounter (Signed)
Thank you.  Report reviewed and results already discussed with pt.

## 2015-11-25 NOTE — Progress Notes (Signed)
Pre visit review using our clinic review tool, if applicable. No additional management support is needed unless otherwise documented below in the visit note. 

## 2015-11-25 NOTE — Assessment & Plan Note (Signed)
Gross hematuria- urine sample is very bloody, unable to dip.  Will send for cx. Concern for nephrolithiasis. CT renal stone- stat. Place on 3 day course of cipro. The patient indicates understanding of these issues and agrees with the plan. Orders Placed This Encounter  Procedures  . Urine culture  . CT RENAL STONE STUDY

## 2015-11-25 NOTE — Patient Instructions (Addendum)
Great to see you. Please stop by to see [name] [name] your way out.  Please take cipro as directed- 1 tablet twice daily x 3 days.

## 2015-11-25 NOTE — Progress Notes (Signed)
Subjective:   Patient ID: [name] [name], female    DOB: Dec 14, 1950, 65 y.o.   MRN: [id number]  [name] [name] is a pleasant 65 y.o. year old female who presents to clinic today with Hematuria and Dysuria  on 11/25/2015  HPI:  Was seen at [facility] 5 days ago for severe left lower quadrant pain. Notes reviewed. UA neg. KUB showed stool- was told likely constipation.  That pain has improved but now has dysuria and gross hematuria.  Did have a fever a few days ago but that has resolved.  Current Outpatient Prescriptions on File Prior to Visit  Medication Sig Dispense Refill  . bisacodyl (BISACODYL) 5 MG EC tablet Take 5 mg by mouth daily as needed for moderate constipation.    . calcium-vitamin D (OSCAL WITH D) 500-200 MG-UNIT per tablet Take 2 tablets by mouth daily.    . Cyanocobalamin (VITAMIN B 12 PO) Take by mouth daily.    Marland Kitchen docusate sodium (COLACE) 100 MG capsule Take 100 mg by mouth 3 (three) times daily as needed for mild constipation.     . fluticasone (FLONASE) 50 MCG/ACT nasal spray Place 2 sprays into both nostrils daily. Reported on 04/17/2015    . Multiple Vitamin (MULTIVITAMIN) capsule Take 1 capsule by mouth daily.       No current facility-administered medications on file prior to visit.     Allergies  Allergen Reactions  . Morphine And Related     Nausea, dizziness, hypotension, hypoxia    Past Medical History:  Diagnosis Date  . Anemia    IN THE PAST WITH PREGNANCY  . Arthritis    LEFT FOOT; OCCAS LOWER BACK PAIN - HX OF VERTEBRAL FRACTURES  . Blood transfusion without reported diagnosis    DURING PREGNANCY  . Coronary artery spasm (HCC) 1990'S   AFTER OTC DIET PILLS - PT EXPERIENCED CHEST PAIN- PT HAD CARDIAC CATH - TOLD NO CAD - THOUGHT TO HAVE HAS CORNARY ARTERY SPASM  . H/O seasonal allergies   . Headache(784.0)    OCCAS SINUS HEADACHES  . Hearing impaired    LEFT EAR DEAFNESS  . Hypertension    220/104 IN SPRING 2015 - SAW HER DOCTOR - BUT B/P  NORMALIZED - PT DECIDED TO STOP CAFFEINE AND HAS NOT HAD ANY ELEVATED PRESSURES SINCE JUNE  . Osteopenia   . Strabismus    HAD STRABISMUS SURGERY 1956 - BUT STILL HAS STRABISMUS  . Vitamin D deficiency     Past Surgical History:  Procedure Laterality Date  . ABDOMINAL HYSTERECTOMY    . APPENDECTOMY    . BREATH TEK H PYLORI N/A 10/08/2013   Procedure: BREATH TEK H PYLORI;  Surgeon: Gayland Curry, MD;  Location: [facility];  Service: General;  Laterality: N/A;  . BREATH TEK H PYLORI N/A 12/03/2013   Procedure: BREATH TEK Kandis Ban;  Surgeon: Gayland Curry, MD;  Location: [facility];  Service: General;  Laterality: N/A;  . INNER EAR [place]  . LAPAROSCOPIC GASTRIC SLEEVE RESECTION N/A 12/18/2013   Procedure: LAPAROSCOPIC GASTRIC SLEEVE RESECTION;  Surgeon: Greer Pickerel, MD;  Location: [facility];  Service: General;  Laterality: N/A;  . REPAIR TENDONS FOOT Right 1994   AND RECONSTRUCTIVE SURGERY FOR MULTIPLE FRACTURES OF THE RIGHT FOOT  . strabismus repair    . TONSILLECTOMY      Family History  Problem Relation Age of Onset  . Hypertension Mother   . Diabetes Mother   . Obesity  Mother   . Heart disease Mother   . COPD Other   . Hyperlipidemia Other     Social History   Social History  . Marital status: Married    Spouse name: N/A  . Number of children: 2  . Years of education: N/A   Occupational History  . 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 Smoking status: Never Smoker  . Smokeless tobacco: Never Used  . Alcohol use No  . Drug use: No  . Sexual activity: Not on file   Other Topics Concern  . Not on file   Social History Narrative   Herbalist at [employer]   The PMH, [place], Social History, Family History, Medications, and allergies have been reviewed in [facility], and have been updated if relevant.   Review of Systems  Gastrointestinal: Negative.   Genitourinary: Positive for dysuria, flank pain,  frequency and hematuria.  Neurological: Negative.   All other systems reviewed and are negative.      Objective:    BP 132/62   Pulse 79   Temp 98.2 F (36.8 C) (Oral)   Wt 138 lb 4 oz (62.7 kg)   SpO2 99%   BMI 28.89 kg/m    Physical Exam  Constitutional: She is oriented to person, place, and time. She appears well-developed and well-nourished.  HENT:  Head: Normocephalic.  Eyes: Conjunctivae are normal.  Cardiovascular: Normal rate.   Pulmonary/Chest: Effort normal.  Abdominal: Soft. Bowel sounds are normal. She exhibits no distension and no mass. There is tenderness. There is no rebound and no guarding.  Musculoskeletal: Normal range of motion.  Neurological: She is alert and oriented to person, place, and time. No cranial nerve deficit.  Skin: Skin is warm and dry. She is not diaphoretic.  Psychiatric: She has a normal mood and affect. Her behavior is normal. Judgment and thought content normal.  Nursing note and vitals reviewed.         Assessment & Plan:   Hematuria - Plan: Urine culture, CANCELED: POCT Urinalysis Dipstick (Automated) No Follow-up on file.

## 2015-11-27 ENCOUNTER — Encounter: Payer: Self-pay | Admitting: Family Medicine

## 2015-11-28 LAB — URINE CULTURE

## 2016-01-07 ENCOUNTER — Encounter (HOSPITAL_COMMUNITY): Payer: Self-pay

## 2016-01-12 ENCOUNTER — Telehealth (HOSPITAL_COMMUNITY): Payer: Self-pay

## 2016-01-12 NOTE — Telephone Encounter (Signed)
°  This patient is overdue for recommended follow-up with a bariatric surgeon at [facility].  A letter was mailed to the patient on 10.4.17 to the address on file from [place] in attempt to re-establish care. Letter advised the patient on the benefits of follow-up care and directing them to call [facility] at [phone] to schedule an appointment at their earliest convenience. The letter included a patient survey which was returned to the [facility]ariat[facility] today. Patient reports that she follows up regularly with her PCP, Arnette Norris MD, & not interested in scheduling an appointment at [place]. Forwarded copy of the survey to Mallory Shirk at [facility] so that she may document patients feedback in their medical record.   Alphonsa Overall. [facility] Bariatric Office Coordinator [phone]

## 2016-03-08 ENCOUNTER — Encounter: Payer: Self-pay | Admitting: Family Medicine

## 2016-03-09 DIAGNOSIS — F4323 Adjustment disorder with mixed anxiety and depressed mood: Secondary | ICD-10-CM | POA: Diagnosis not present

## 2016-03-11 ENCOUNTER — Ambulatory Visit (INDEPENDENT_AMBULATORY_CARE_PROVIDER_SITE_OTHER): Payer: 59 | Admitting: Family Medicine

## 2016-03-11 ENCOUNTER — Encounter: Payer: Self-pay | Admitting: Family Medicine

## 2016-03-11 VITALS — BP 114/72 | HR 77 | Temp 98.1°F | Wt 136.0 lb

## 2016-03-11 DIAGNOSIS — R1013 Epigastric pain: Secondary | ICD-10-CM

## 2016-03-11 DIAGNOSIS — R1011 Right upper quadrant pain: Secondary | ICD-10-CM

## 2016-03-11 LAB — CBC WITH DIFFERENTIAL/PLATELET
BASOS ABS: 0 10*3/uL (ref 0.0–0.1)
Basophils Relative: 0.3 % (ref 0.0–3.0)
EOS ABS: 0.1 10*3/uL (ref 0.0–0.7)
Eosinophils Relative: 1.1 % (ref 0.0–5.0)
HEMATOCRIT: 38.9 % (ref 36.0–46.0)
HEMOGLOBIN: 13.2 g/dL (ref 12.0–15.0)
LYMPHS PCT: 52 % — AB (ref 12.0–46.0)
Lymphs Abs: 3.9 10*3/uL (ref 0.7–4.0)
MCHC: 33.9 g/dL (ref 30.0–36.0)
MCV: 95 fl (ref 78.0–100.0)
Monocytes Absolute: 0.5 10*3/uL (ref 0.1–1.0)
Monocytes Relative: 6.8 % (ref 3.0–12.0)
Neutro Abs: 3 10*3/uL (ref 1.4–7.7)
Neutrophils Relative %: 39.8 % — ABNORMAL LOW (ref 43.0–77.0)
Platelets: 318 10*3/uL (ref 150.0–400.0)
RBC: 4.09 Mil/uL (ref 3.87–5.11)
RDW: 14 % (ref 11.5–15.5)
WBC: 7.4 10*3/uL (ref 4.0–10.5)

## 2016-03-11 LAB — COMPREHENSIVE METABOLIC PANEL
ALBUMIN: 4.6 g/dL (ref 3.5–5.2)
ALT: 114 U/L — AB (ref 0–35)
AST: 42 U/L — AB (ref 0–37)
Alkaline Phosphatase: 109 U/L (ref 39–117)
BILIRUBIN TOTAL: 0.3 mg/dL (ref 0.2–1.2)
BUN: 10 mg/dL (ref 6–23)
CALCIUM: 9.5 mg/dL (ref 8.4–10.5)
CO2: 31 mEq/L (ref 19–32)
CREATININE: 0.65 mg/dL (ref 0.40–1.20)
Chloride: 104 mEq/L (ref 96–112)
GFR: 97.59 mL/min (ref >60.00)
Glucose, Bld: 130 mg/dL — ABNORMAL HIGH (ref 70–99)
Potassium: 3.9 mEq/L (ref 3.5–5.1)
Sodium: 143 mEq/L (ref 135–145)
TOTAL PROTEIN: 7 g/dL (ref 6.0–8.3)

## 2016-03-11 LAB — LIPASE: Lipase: 54 U/L (ref 11.0–59.0)

## 2016-03-11 NOTE — Progress Notes (Signed)
Subjective:   Patient ID: [name] [name], female    DOB: 29-Apr-1950, 64 y.o.   MRN: [id number]  [name] [name] is a pleasant 65 y.o. year old female who presents to clinic today with Abdominal Pain (August 2017 CT showed gallstones)  on 03/11/2016  HPI:  Several days of intermittent right upper quadrant/epigastric pain.  Very severe - 10/10 at its worst.  It can last up to 4 or 5 hours.  Some nausea, no vomiting or fevers.  She is not sure anything really makes it better.  CT in August did show gallstones.  She does not think eating makes it better or worse.  Remote h/o bariatric surgery ( 2 years ago).  Current Outpatient Prescriptions on File Prior to Visit  Medication Sig Dispense Refill  . bisacodyl (BISACODYL) 5 MG EC tablet Take 5 mg by mouth daily as needed for moderate constipation.    . calcium-vitamin D (OSCAL WITH D) 500-200 MG-UNIT per tablet Take 2 tablets by mouth daily.    . Cyanocobalamin (VITAMIN B 12 PO) Take by mouth daily.    Marland Kitchen docusate sodium (COLACE) 100 MG capsule Take 100 mg by mouth 3 (three) times daily as needed for mild constipation.     . fluticasone (FLONASE) 50 MCG/ACT nasal spray Place 2 sprays into both nostrils daily. Reported on 04/17/2015    . Multiple Vitamin (MULTIVITAMIN) capsule Take 1 capsule by mouth daily.       No current facility-administered medications on file prior to visit.     Allergies  Allergen Reactions  . Morphine And Related     Nausea, dizziness, hypotension, hypoxia    Past Medical History:  Diagnosis Date  . Anemia    IN THE PAST WITH PREGNANCY  . Arthritis    LEFT FOOT; OCCAS LOWER BACK PAIN - HX OF VERTEBRAL FRACTURES  . Blood transfusion without reported diagnosis    DURING PREGNANCY  . Coronary artery spasm (HCC) 1990'S   AFTER OTC DIET PILLS - PT EXPERIENCED CHEST PAIN- PT HAD CARDIAC CATH - TOLD NO CAD - THOUGHT TO HAVE HAS CORNARY ARTERY SPASM  . H/O seasonal allergies   . Headache(784.0)    OCCAS  SINUS HEADACHES  . Hearing impaired    LEFT EAR DEAFNESS  . Hypertension    220/104 IN SPRING 2015 - SAW HER DOCTOR - BUT B/P NORMALIZED - PT DECIDED TO STOP CAFFEINE AND HAS NOT HAD ANY ELEVATED PRESSURES SINCE JUNE  . Osteopenia   . Strabismus    HAD STRABISMUS SURGERY 1956 - BUT STILL HAS STRABISMUS  . Vitamin D deficiency     Past Surgical History:  Procedure Laterality Date  . ABDOMINAL HYSTERECTOMY    . APPENDECTOMY    . BREATH TEK H PYLORI N/A 10/08/2013   Procedure: BREATH TEK H PYLORI;  Surgeon: Gayland Curry, MD;  Location: [facility];  Service: General;  Laterality: N/A;  . BREATH TEK H PYLORI N/A 12/03/2013   Procedure: BREATH TEK Kandis Ban;  Surgeon: Gayland Curry, MD;  Location: [facility];  Service: General;  Laterality: N/A;  . INNER EAR [place]  . LAPAROSCOPIC GASTRIC SLEEVE RESECTION N/A 12/18/2013   Procedure: LAPAROSCOPIC GASTRIC SLEEVE RESECTION;  Surgeon: Greer Pickerel, MD;  Location: [facility];  Service: General;  Laterality: N/A;  . REPAIR TENDONS FOOT Right 1994   AND RECONSTRUCTIVE SURGERY FOR MULTIPLE FRACTURES OF THE RIGHT FOOT  . strabismus repair    . TONSILLECTOMY  Family History  Problem Relation Age of Onset  . Hypertension Mother   . Diabetes Mother   . Obesity Mother   . Heart disease Mother   . COPD Other   . Hyperlipidemia Other     Social History   Social History  . Marital status: Married    Spouse name: N/A  . Number of children: 2  . Years of education: N/A   Occupational History  . 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 Smoking status: Never Smoker  . Smokeless tobacco: Never Used  . Alcohol use No  . Drug use: No  . Sexual activity: Not on file   Other Topics Concern  . 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   Review of Systems  Constitutional: Negative.   Gastrointestinal: Positive for abdominal pain and nausea. Negative for abdominal distention, anal bleeding, blood in stool, constipation, diarrhea, rectal pain and vomiting.  Genitourinary: Negative.   Musculoskeletal: Negative.   Skin: Negative.   Neurological: Negative.   All other systems reviewed and are negative.      Objective:    BP 114/72   Pulse 77   Temp 98.1 F (36.7 C) (Oral)   Wt 136 lb (61.7 kg)   SpO2 95%   BMI 28.42 kg/m    Physical Exam  Constitutional: She is oriented to person, place, and time. She appears well-developed and well-nourished. No distress.  HENT:  Head: Normocephalic.  Neck: Normal range of motion.  Cardiovascular: Normal rate.   Pulmonary/Chest: Effort normal.  Abdominal: Normal appearance. There is no hepatosplenomegaly. There is no tenderness. There is no rebound.  Musculoskeletal: Normal range of motion.  Neurological: She is alert and oriented to person, place, and time. No cranial nerve deficit.  Skin: Skin is warm and dry.  Psychiatric: She has a normal mood and affect. Her behavior is normal. Judgment and thought content normal.          Assessment & Plan:   Epigastric pain No Follow-up on file.

## 2016-03-11 NOTE — Patient Instructions (Signed)
Great to see you. Please stop by to see [name] [name] on your way out.  Happy Holidays!

## 2016-03-11 NOTE — Progress Notes (Signed)
Pre visit review using our clinic review tool, if applicable. No additional management support is needed unless otherwise documented below in the visit note. 

## 2016-03-11 NOTE — Assessment & Plan Note (Signed)
New- ? Biliary colic. Will check labs today and order RUQ [place] for further evaluation. The patient indicates understanding of these issues and agrees with the plan. Orders Placed This Encounter  Procedures  . US Abdomen Limited RUQ  . CBC with Differential/Platelet  . Comprehensive metabolic panel  . Lipase

## 2016-03-12 ENCOUNTER — Encounter: Payer: Self-pay | Admitting: Family Medicine

## 2016-03-19 ENCOUNTER — Encounter: Payer: Self-pay | Admitting: Family Medicine

## 2016-03-19 ENCOUNTER — Other Ambulatory Visit: Payer: Self-pay | Admitting: Family Medicine

## 2016-03-19 ENCOUNTER — Ambulatory Visit
Admission: RE | Admit: 2016-03-19 | Discharge: 2016-03-19 | Disposition: A | Discharge status: Home / Self Care | Payer: 59 | Source: Ambulatory Visit | Attending: Family Medicine | Admitting: Family Medicine

## 2016-03-19 ENCOUNTER — Telehealth: Payer: Self-pay | Admitting: Family Medicine

## 2016-03-19 DIAGNOSIS — R1013 Epigastric pain: Secondary | ICD-10-CM

## 2016-03-19 DIAGNOSIS — R1011 Right upper quadrant pain: Secondary | ICD-10-CM

## 2016-03-19 DIAGNOSIS — K8020 Calculus of gallbladder without cholecystitis without obstruction: Secondary | ICD-10-CM | POA: Diagnosis not present

## 2016-03-19 DIAGNOSIS — K8021 Calculus of gallbladder without cholecystitis with obstruction: Secondary | ICD-10-CM

## 2016-03-19 NOTE — Telephone Encounter (Signed)
Thank you :)

## 2016-03-19 NOTE — Telephone Encounter (Signed)
[facility] to get her an appt with Dr [name] the Surgeon who did her gastric sleeve . I spoke to the triage nurse who sent a message back to Dr Laqueta Linden nurse. They are supposed to call the patient to offer her an appt next week. Her MRI is scheduled for Tuesday, Dec 19th at 12 noon at [facility] and patient is aware.

## 2016-03-23 ENCOUNTER — Encounter: Payer: Self-pay | Admitting: Family Medicine

## 2016-03-23 ENCOUNTER — Ambulatory Visit (HOSPITAL_COMMUNITY)
Admission: RE | Admit: 2016-03-23 | Discharge: 2016-03-23 | Disposition: A | Discharge status: Home / Self Care | Payer: 59 | Source: Ambulatory Visit | Attending: Family Medicine | Admitting: Family Medicine

## 2016-03-23 ENCOUNTER — Other Ambulatory Visit: Payer: Self-pay | Admitting: Family Medicine

## 2016-03-23 DIAGNOSIS — K8021 Calculus of gallbladder without cholecystitis with obstruction: Secondary | ICD-10-CM | POA: Insufficient documentation

## 2016-03-23 DIAGNOSIS — R935 Abnormal findings on diagnostic imaging of other abdominal regions, including retroperitoneum: Secondary | ICD-10-CM | POA: Diagnosis not present

## 2016-03-23 DIAGNOSIS — K838 Other specified diseases of biliary tract: Secondary | ICD-10-CM | POA: Diagnosis not present

## 2016-03-23 DIAGNOSIS — K8020 Calculus of gallbladder without cholecystitis without obstruction: Secondary | ICD-10-CM | POA: Diagnosis not present

## 2016-03-23 MED ORDER — GADOBENATE DIMEGLUMINE 529 MG/ML IV SOLN
15.0000 mL | Freq: Once | INTRAVENOUS | Status: AC | PRN
  Administered 2016-03-23: 12 mL via INTRAVENOUS

## 2016-03-24 ENCOUNTER — Encounter: Payer: Self-pay | Admitting: Family Medicine

## 2016-03-24 ENCOUNTER — Ambulatory Visit: Payer: Self-pay | Admitting: General Surgery

## 2016-03-24 DIAGNOSIS — Z9884 Bariatric surgery status: Secondary | ICD-10-CM | POA: Diagnosis not present

## 2016-03-24 DIAGNOSIS — K8020 Calculus of gallbladder without cholecystitis without obstruction: Secondary | ICD-10-CM | POA: Diagnosis not present

## 2016-03-24 DIAGNOSIS — I10 Essential (primary) hypertension: Secondary | ICD-10-CM | POA: Diagnosis not present

## 2016-03-24 NOTE — H&P (Signed)
[name] [name] 03/24/2016 1:42 PM Location: [facility] Patient #: [id number] DOB: Jul 16, 1950 Married / Language: English / Race: White Female  History of Present Illness Randall Hiss M. Jasira Robinson MD; 03/24/2016 6:43 PM) Patient words: GB.  The patient is a 65 year old female presenting status-post bariatric surgery. 65 yo wf s/p laparoscopic sleeve gastrectomy on 12/18/13 comes in for long term followup but mainly comes in for gallbladder issues. Her initial visit weight was 203 pounds. her preop weight was 201 pounds  She states that she has been doing very well until a few weeks ago when she started having right upper quadrant pain rating to her back. Initially would last for about 30 minutes but now is lasting hours at a time. One week she had 5 episodes in 1 week. It is occasionally associated with nausea. It is not necessarily related to food intake. This prompted her to go see her physician who ordered labs and an ultrasound. She was found to have elevated LFTs. She had an ultrasound which showed cholelithiasis. Common bile duct had a diameter of 7.9 mm. She underwent an MRCP yesterday which revealed no evidence of cholecystitis or choledocholithiasis; however, her bowel duct was dilated up to 9 mm. Her AST was mildly elevated to 42. Her ALT was elevated to 114. Her bilirubin and alkaline phosphatase were normal.  Prior to this she had not been having any abdominal pain or nausea and only mild heartburn. She states that she is taking her vitamins and supplements as recommended. She is still very happy that she underwent weight loss surgery.  Review of systems a comprehensive 12 point review of systems was performed and all systems are negative except for what is mentioned in HPI   Problem List/Past Medical Randall Hiss M. [name], MD; 03/24/2016 6:43 PM) OBESITY (BMI 30-39.9) (E66.9) High blood pressure VITAMIN D DEFICIENCY (E55.9) OSTEOPENIA (M85.80) SYMPTOMATIC  CHOLELITHIASIS (K80.20) S/P LAPAROSCOPIC SLEEVE GASTRECTOMY (Z98.84) 12/18/13; iv weight 203 lbs; preop weight 201 lbs HYPERTENSION, ESSENTIAL (I10)  Past Surgical History Randall Hiss M. [name], MD; 03/24/2016 6:42 PM) Cesarean Section - Multiple Foot Surgery Left. Hysterectomy (not due to cancer) - Partial Oral Surgery Tonsillectomy Sleeve Gastrectomy 12/18/2013 Initial visit 203 lbs; preop wt 201  Diagnostic Studies History (Anthoni Geerts M. [name], MD; 03/24/2016 6:42 PM) Colonoscopy 5-10 years ago Mammogram within last year Pap Smear 1-5 years ago  Allergies (Ammie Eversole, LPN; D34-534 QA348G PM) Morphine Sulfate (Concentrate) *ANALGESICS - OPIOID* No Known Drug Allergies 03/24/2016 (Marked as Inactive)  Medication History Randall Hiss M. [name], MD; 03/24/2016 6:42 PM) Medications Reconciled Hydrocodone-Acetaminophen (5-325MG  Tablet, 1 (one) Tablet Oral every twenty-four hours, as needed, Taken starting 03/24/2016) Active. Ondansetron (4MG  Tablet Disint, 1 (one) Tablet Oral every twelve hours, as needed, Taken starting 03/24/2016) Active.  Social History Randall Hiss M. [name], MD; 03/24/2016 6:42 PM) Caffeine use Carbonated beverages, Coffee. No alcohol use No drug use Tobacco use Never smoker.  Family History Randall Hiss M. [name], MD; 03/24/2016 6:42 PM) Alcohol Abuse Father. Depression Mother, Sister. Diabetes Mellitus Mother. Heart Disease Mother. Heart disease in female family member before age 44 Hypertension Brother, Mother. Kidney Disease Mother.  Pregnancy / Birth History Randall Hiss M. [name], MD; 03/24/2016 6:42 PM) Age at menarche 37 years. Age of menopause 89-55 Gravida 3 Irregular periods Maternal age 63-30 Para 2  Other Problems Randall Hiss M. [name], MD; 03/24/2016 6:43 PM) Back Pain Chest pain Hemorrhoids Transfusion history    Vitals (Ammie Eversole LPN; D34-534 QA348G PM) 03/24/2016 1:42 PM Weight: 137.8 lb Height: 58.5in Body  Surface  Area: 1.56 m Body Mass Index: 28.31 kg/m  Temp.: 98.39F(Oral)  Pulse: 83 (Regular)  BP: 132/60 (Sitting, Left Arm, Standard)      Physical Exam Randall Hiss M. Kazue Cerro MD; 03/24/2016 6:36 PM)  General Mental Status-Alert. General Appearance-Consistent with stated age. Hydration-Well hydrated. Voice-Normal.  Head and Neck Head-normocephalic, atraumatic with no lesions or palpable masses. Trachea-midline. Thyroid Gland Characteristics - normal size and consistency.  Eye Eyeball - Bilateral-Normal. Sclera/Conjunctiva - Bilateral-No scleral icterus.  Chest and Lung Exam Chest and lung exam reveals -quiet, even and easy respiratory effort with no use of accessory muscles and on auscultation, normal breath sounds, no adventitious sounds and normal vocal resonance. Inspection Chest Wall - Normal. Back - normal.  Breast - Did not examine.  Cardiovascular Cardiovascular examination reveals -normal heart sounds, regular rate and rhythm with no murmurs and normal pedal pulses bilaterally.  Abdomen Inspection Inspection of the abdomen reveals - No Hernias. Skin - Scar - Note: well healed trocar scars. Palpation/Percussion Palpation and Percussion of the abdomen reveal - Soft, Non Tender, No Rebound tenderness, No Rigidity (guarding) and No hepatosplenomegaly. Auscultation Auscultation of the abdomen reveals - Bowel sounds normal.  Peripheral Vascular Upper Extremity Palpation - Pulses bilaterally normal.  Neurologic Neurologic evaluation reveals -alert and oriented x 3 with no impairment of recent or remote memory. Mental Status-Normal.  Neuropsychiatric The patient's mood and affect are described as -normal. Judgment and Insight-insight is appropriate concerning matters relevant to self.  Musculoskeletal Normal Exam - Left-Upper Extremity Strength Normal and Lower Extremity Strength Normal. Normal Exam - Right-Upper Extremity Strength  Normal and Lower Extremity Strength Normal.  Lymphatic Head & Neck  General Head & Neck Lymphatics: Bilateral - Description - Normal. Axillary - Did not examine. Femoral & Inguinal - Did not examine.    Assessment & Plan Randall Hiss M. Gelisa Tieken MD; 03/24/2016 6:43 PM)  S/P LAPAROSCOPIC SLEEVE GASTRECTOMY (Z98.84) Story: 12/18/13; iv weight 203 lbs; preop weight 201 lbs Impression: doing well. no signs of complications. rediscussed eating techniques such 20-20-20 ( 1 small bite, 20 chews, 20 sec b/t bites, and 20 minutes. discussed importance drinking plenty of liquids and protein. We discussed the importance of being active and walking throughut day. I will check bariatric labs during her preop visit at hospital.  [place] (K80.20) Impression: I believe the patient's symptoms are consistent with gallbladder disease.  We discussed gallbladder disease. The patient was given Neurosurgeon. We discussed non-operative and operative management. We discussed the signs & symptoms of acute cholecystitis  I discussed laparoscopic cholecystectomy with IOC in detail. The patient was given educational material as well as diagrams detailing the procedure. We discussed the risks and benefits of a laparoscopic cholecystectomy including, but not limited to bleeding, infection, injury to surrounding structures such as the intestine or liver, bile leak, retained gallstones, need to convert to an open procedure, prolonged diarrhea, blood clots such as DVT, common bile duct injury, anesthesia risks, and possible need for additional procedures. We discussed the typical post-operative recovery course. I explained that the likelihood of improvement of their symptoms is good.  The patient has elected to proceed with surgery.  I did advise her that she is more than likely to have additional attacks between now and surgery. We went over several times the difference between an attack and infection. She  was given a prescription for pain and nausea medication  Current Plans Pt Education - Pamphlet Given - Laparoscopic Gallbladder Surgery: discussed with patient and provided information. You  are being scheduled for surgery- Our schedulers will call you.  You should hear from our office's scheduling department within 5 working days about the location, date, and time of surgery. We try to make accommodations for patient's preferences in scheduling surgery, but sometimes the OR schedule or the surgeon's schedule prevents [place] from making those accommodations.  If you have not heard from our office [phone]) in 5 working days, call the office and ask for your surgeon's nurse.  If you have other questions about your diagnosis, plan, or surgery, call the office and ask for your surgeon's nurse.  Started Hydrocodone-Acetaminophen 5-325MG , 1 (one) Tablet every twenty-four hours, as needed, #15, 03/24/2016, No Refill. Started Ondansetron 4MG , 1 (one) Tablet every twelve hours, as needed, #15, 03/24/2016, No Refill. HYPERTENSION, ESSENTIAL (I10) Impression: improved. no longer on meds  [name]. [name], MD, FACS General, Bariatric, & Minimally Invasive Surgery [facility], [place]

## 2016-04-07 ENCOUNTER — Encounter: Payer: Self-pay | Admitting: Family Medicine

## 2016-04-08 NOTE — Pre-Procedure Instructions (Addendum)
    [name] [name]  04/08/2016      [id number] - [name], [place] - [address] [place] [address] Phone: [phone] Fax: [phone]    Your procedure is scheduled on Tues. Jan. 9  Report to [facility] Admitting at 5:30 A.M.  Call this number if you have problems the morning of surgery:  [phone]   Remember:  Do not eat food or drink liquids after midnight on Mon, Jan. 8   Take these medicines the morning of surgery with A SIP OF WATER : flonase nasal spray if needed             Stop aspirin, advil, motrin, aleve, BC Powders, goody's, vitamins/herbal medicines   Do not wear jewelry, make-up or nail polish.  Do not wear lotions, powders, or perfumes, or deoderant.  Do not shave 48 hours prior to surgery.  Men may shave face and neck.  Do not bring valuables to the hospital.  [facility] is not responsible for any belongings or valuables.  Contacts, dentures or bridgework may not be worn into surgery.  Leave your suitcase in the car.  After surgery it may be brought to your room.  For patients admitted to the hospital, discharge time will be determined by your treatment team.  Patients discharged the day of surgery will not be allowed to drive home.   Name and phone number of your driver:    Special instructions:  Review preparing for surgery handout  Please read over the following fact sheets that you were given. Coughing and Deep Breathing

## 2016-04-09 ENCOUNTER — Encounter (HOSPITAL_COMMUNITY): Payer: Self-pay

## 2016-04-09 ENCOUNTER — Encounter (HOSPITAL_COMMUNITY)
Admission: RE | Admit: 2016-04-09 | Discharge: 2016-04-09 | Disposition: A | Discharge status: Home / Self Care | Payer: 59 | Source: Ambulatory Visit | Attending: General Surgery | Admitting: General Surgery

## 2016-04-09 DIAGNOSIS — Z01812 Encounter for preprocedural laboratory examination: Secondary | ICD-10-CM | POA: Diagnosis not present

## 2016-04-09 DIAGNOSIS — K8080 Other cholelithiasis without obstruction: Secondary | ICD-10-CM | POA: Insufficient documentation

## 2016-04-09 HISTORY — DX: Adverse effect of unspecified anesthetic, initial encounter: T41.45XA

## 2016-04-09 HISTORY — DX: Other complications of anesthesia, initial encounter: T88.59XA

## 2016-04-09 LAB — COMPREHENSIVE METABOLIC PANEL
ALBUMIN: 4.2 g/dL (ref 3.5–5.0)
ALT: 50 U/L (ref 14–54)
ANION GAP: 8 (ref 5–15)
AST: 41 U/L (ref 15–41)
Alkaline Phosphatase: 87 U/L (ref 38–126)
BILIRUBIN TOTAL: 0.3 mg/dL (ref 0.3–1.2)
BUN: 13 mg/dL (ref 6–20)
CO2: 28 mmol/L (ref 22–32)
Calcium: 9.2 mg/dL (ref 8.9–10.3)
Chloride: 105 mmol/L (ref 101–111)
Creatinine, Ser: 0.65 mg/dL (ref 0.44–1.00)
GFR calc non Af Amer: >60 mL/min (ref >60)
GLUCOSE: 88 mg/dL (ref 65–99)
POTASSIUM: 3.9 mmol/L (ref 3.5–5.1)
SODIUM: 141 mmol/L (ref 135–145)
TOTAL PROTEIN: 6.5 g/dL (ref 6.5–8.1)

## 2016-04-09 LAB — CBC
HEMATOCRIT: 41.3 % (ref 36.0–46.0)
Hemoglobin: 13.8 g/dL (ref 12.0–15.0)
MCH: 31.7 pg (ref 26.0–34.0)
MCHC: 33.4 g/dL (ref 30.0–36.0)
MCV: 94.9 fL (ref 78.0–100.0)
Platelets: 285 10*3/uL (ref 150–400)
RBC: 4.35 MIL/uL (ref 3.87–5.11)
RDW: 12.8 % (ref 11.5–15.5)
WBC: 6.3 10*3/uL (ref 4.0–10.5)

## 2016-04-09 LAB — IRON: Iron: 106 ug/dL (ref 28–170)

## 2016-04-09 LAB — VITAMIN B12: VITAMIN B 12: 402 pg/mL (ref 180–914)

## 2016-04-09 NOTE — Progress Notes (Signed)
PCP: Dr. Deborra Medina @ [facility]  Pt. Reports she hasn't seen a cardiologist in 2 1/2 yrs. She had seen Dr. Ron Parker and Dr. Peter Martinique, questional of pt. Having a coronary spasm. This was prior to gastric sleeve surgery 2015. Pt. States she has not had any problems since then and denies high blood pressure.

## 2016-04-10 LAB — VITAMIN D 25 HYDROXY (VIT D DEFICIENCY, FRACTURES): VIT D 25 HYDROXY: 27.2 ng/mL — AB (ref 30.0–100.0)

## 2016-04-11 LAB — VITAMIN B1: Vitamin B1 (Thiamine): 152.2 nmol/L (ref 66.5–200.0)

## 2016-04-12 ENCOUNTER — Encounter: Payer: Self-pay | Admitting: Family Medicine

## 2016-04-12 NOTE — Progress Notes (Signed)
Anesthesia chart review: Patient is a 66 year old female scheduled for laparoscopic cholecystectomy on 04/13/2016 by Dr. [name].  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 She did not meet the AHI criteria for OSA by 2015 sleep study (done prior to bariatric surgery).  BP 131/77   Pulse 80   Temp 36.5 C   Resp 20   Ht 4\' 10"  (1.473 m)   Wt 137 lb (62.1 kg)   SpO2 97%   BMI 28.63 kg/m   PCP is Dr. Arnette Norris. She is not routinely followed by a cardiologist. She saw Dr. Ron Parker in 2002 for chest pain and 8 beats of VT during hospitalization. Coronaries showed normal cath with some RCA spasm on initial injection. (She saw Dr. Peter Martinique later on 07/06/13 for atypical chest pain. He felt the RCA spasm in 2002 was catheter induced and unlikely that patient had vasospastic coronary disease. Echo and EKG were normal. He did not recommended additional testing and did recommended on-going cardiology follow-up unless she developed worsening symptoms.)  Meds include Os-Cal with D, vitamin B12, Flonase, multivitamin.  Echo 07/02/13: Study Conclusions Left ventricle: The cavity size was normal. Wall thickness was normal. Systolic function was normal. The estimated ejection fraction was in the range of 60% to 65%. Wall motion was normal; there were no regional wall motion abnormalities.     Cardiac cath 04/29/00 (Dr. Dola Argyle): CONCLUSIONS: 1. Normal left ventricular function. 2. No evidence of aortic dissection. 3. No high-grade coronary stenosis. (Some ostial spasm of of RCA with initial injection.) DISPOSITION:  We will have the patient follow up with Dr. Chrissie Noa.  Limitation of the patients caffeine intake will be recommended.  Labs from 04/09/16 noted. CBC,  CMET WNL.   EKG not done by PAT RN as patient has not had any recent issues with elevated BP (since bariatric surgery) and cardiac cath in 2002 showed no significant CAD. I would be inclined to update EKG (last one from 2015) given her history of elevated BP in the past and coronary vasospasm (although likely catheter induced and not vasospastic disease). I will enter preoperative EKG order, but anesthesiologist can discontinue if not felt indicated.   George Hugh [facility] Short Stay Center/Anesthesiology Phone [phone] 04/12/2016 10:26 AM

## 2016-04-13 ENCOUNTER — Ambulatory Visit (HOSPITAL_COMMUNITY): Payer: 59

## 2016-04-13 ENCOUNTER — Encounter (HOSPITAL_COMMUNITY)
Admission: RE | Disposition: A | Discharge status: Home / Self Care | Payer: Self-pay | Source: Ambulatory Visit | Attending: General Surgery

## 2016-04-13 ENCOUNTER — Ambulatory Visit (HOSPITAL_COMMUNITY): Payer: 59 | Admitting: Vascular Surgery

## 2016-04-13 ENCOUNTER — Encounter (HOSPITAL_COMMUNITY): Payer: Self-pay | Admitting: *Deleted

## 2016-04-13 ENCOUNTER — Ambulatory Visit (HOSPITAL_COMMUNITY)
Admission: RE | Admit: 2016-04-13 | Discharge: 2016-04-13 | Disposition: A | Discharge status: Home / Self Care | Payer: 59 | Source: Ambulatory Visit | Attending: General Surgery | Admitting: General Surgery

## 2016-04-13 DIAGNOSIS — Z9884 Bariatric surgery status: Secondary | ICD-10-CM | POA: Insufficient documentation

## 2016-04-13 DIAGNOSIS — K8010 Calculus of gallbladder with chronic cholecystitis without obstruction: Secondary | ICD-10-CM | POA: Insufficient documentation

## 2016-04-13 DIAGNOSIS — Z885 Allergy status to narcotic agent status: Secondary | ICD-10-CM | POA: Diagnosis not present

## 2016-04-13 DIAGNOSIS — M8580 Other specified disorders of bone density and structure, unspecified site: Secondary | ICD-10-CM | POA: Insufficient documentation

## 2016-04-13 DIAGNOSIS — E559 Vitamin D deficiency, unspecified: Secondary | ICD-10-CM | POA: Diagnosis not present

## 2016-04-13 DIAGNOSIS — Z419 Encounter for procedure for purposes other than remedying health state, unspecified: Secondary | ICD-10-CM

## 2016-04-13 DIAGNOSIS — I10 Essential (primary) hypertension: Secondary | ICD-10-CM | POA: Insufficient documentation

## 2016-04-13 DIAGNOSIS — M25561 Pain in right knee: Secondary | ICD-10-CM | POA: Diagnosis not present

## 2016-04-13 DIAGNOSIS — K8020 Calculus of gallbladder without cholecystitis without obstruction: Secondary | ICD-10-CM | POA: Diagnosis not present

## 2016-04-13 DIAGNOSIS — R7989 Other specified abnormal findings of blood chemistry: Secondary | ICD-10-CM | POA: Insufficient documentation

## 2016-04-13 DIAGNOSIS — R319 Hematuria, unspecified: Secondary | ICD-10-CM | POA: Diagnosis not present

## 2016-04-13 DIAGNOSIS — Z90711 Acquired absence of uterus with remaining cervical stump: Secondary | ICD-10-CM | POA: Insufficient documentation

## 2016-04-13 DIAGNOSIS — Z0389 Encounter for observation for other suspected diseases and conditions ruled out: Secondary | ICD-10-CM | POA: Diagnosis not present

## 2016-04-13 DIAGNOSIS — N390 Urinary tract infection, site not specified: Secondary | ICD-10-CM | POA: Diagnosis not present

## 2016-04-13 HISTORY — PX: CHOLECYSTECTOMY: SHX55

## 2016-04-13 SURGERY — LAPAROSCOPIC CHOLECYSTECTOMY WITH INTRAOPERATIVE CHOLANGIOGRAM
Anesthesia: General | Site: Abdomen

## 2016-04-13 MED ORDER — CHLORHEXIDINE GLUCONATE CLOTH 2 % EX PADS: 6.0000 | MEDICATED_PAD | Freq: Once | CUTANEOUS | Status: DC

## 2016-04-13 MED ORDER — SODIUM CHLORIDE 0.9 % IV SOLN
INTRAVENOUS | Status: DC | PRN
  Administered 2016-04-13: 16 mL

## 2016-04-13 MED ORDER — LACTATED RINGERS IV SOLN
INTRAVENOUS | Status: DC | PRN
  Administered 2016-04-13 (×2): via INTRAVENOUS

## 2016-04-13 MED ORDER — MIDAZOLAM HCL 2 MG/2ML IJ SOLN
INTRAMUSCULAR | Status: AC
  Filled 2016-04-13: qty 2

## 2016-04-13 MED ORDER — OXYCODONE HCL 5 MG PO TABS: 5.0000 mg | ORAL_TABLET | Freq: Four times a day (QID) | ORAL | 0 refills | Status: DC | PRN

## 2016-04-13 MED ORDER — ONDANSETRON HCL 4 MG/2ML IJ SOLN
INTRAMUSCULAR | Status: DC | PRN
  Administered 2016-04-13: 4 mg via INTRAVENOUS

## 2016-04-13 MED ORDER — FENTANYL CITRATE (PF) 100 MCG/2ML IJ SOLN
INTRAMUSCULAR | Status: DC | PRN
  Administered 2016-04-13: 100 ug via INTRAVENOUS

## 2016-04-13 MED ORDER — EPHEDRINE SULFATE 50 MG/ML IJ SOLN
INTRAMUSCULAR | Status: DC | PRN
  Administered 2016-04-13: 5 mg via INTRAVENOUS

## 2016-04-13 MED ORDER — SODIUM CHLORIDE 0.9 % IR SOLN
Status: DC | PRN
Start: 2016-04-13 — End: 2016-04-13
  Administered 2016-04-13: 1000 mL

## 2016-04-13 MED ORDER — IOPAMIDOL (ISOVUE-300) INJECTION 61%
INTRAVENOUS | Status: AC
  Filled 2016-04-13: qty 50

## 2016-04-13 MED ORDER — 0.9 % SODIUM CHLORIDE (POUR BTL) OPTIME
TOPICAL | Status: DC | PRN
  Administered 2016-04-13: 1000 mL

## 2016-04-13 MED ORDER — FENTANYL CITRATE (PF) 100 MCG/2ML IJ SOLN
INTRAMUSCULAR | Status: AC
  Filled 2016-04-13: qty 2

## 2016-04-13 MED ORDER — BUPIVACAINE HCL (PF) 0.25 % IJ SOLN
INTRAMUSCULAR | Status: DC | PRN
  Administered 2016-04-13: 10 mL

## 2016-04-13 MED ORDER — PROMETHAZINE HCL 25 MG/ML IJ SOLN: 6.2500 mg | INTRAMUSCULAR | Status: DC | PRN

## 2016-04-13 MED ORDER — ACETAMINOPHEN 500 MG PO TABS
ORAL_TABLET | ORAL | Status: AC
  Administered 2016-04-13: 1000 mg via ORAL
  Filled 2016-04-13: qty 2

## 2016-04-13 MED ORDER — HYDROMORPHONE HCL 1 MG/ML IJ SOLN
INTRAMUSCULAR | Status: AC
  Filled 2016-04-13: qty 0.5

## 2016-04-13 MED ORDER — SUGAMMADEX SODIUM 200 MG/2ML IV SOLN
INTRAVENOUS | Status: DC | PRN
  Administered 2016-04-13: 150 mg via INTRAVENOUS

## 2016-04-13 MED ORDER — ROCURONIUM BROMIDE 100 MG/10ML IV SOLN
INTRAVENOUS | Status: DC | PRN
  Administered 2016-04-13: 40 mg via INTRAVENOUS

## 2016-04-13 MED ORDER — HYDROMORPHONE HCL 1 MG/ML IJ SOLN
0.2500 mg | INTRAMUSCULAR | Status: DC | PRN
  Administered 2016-04-13: 0.5 mg via INTRAVENOUS

## 2016-04-13 MED ORDER — PHENYLEPHRINE HCL 10 MG/ML IJ SOLN
INTRAMUSCULAR | Status: DC | PRN
  Administered 2016-04-13: 80 ug via INTRAVENOUS

## 2016-04-13 MED ORDER — PROPOFOL 10 MG/ML IV BOLUS
INTRAVENOUS | Status: DC | PRN
  Administered 2016-04-13: 140 mg via INTRAVENOUS

## 2016-04-13 MED ORDER — BUPIVACAINE HCL (PF) 0.25 % IJ SOLN
INTRAMUSCULAR | Status: AC
  Filled 2016-04-13: qty 30

## 2016-04-13 MED ORDER — MIDAZOLAM HCL 5 MG/5ML IJ SOLN
INTRAMUSCULAR | Status: DC | PRN
  Administered 2016-04-13: 2 mg via INTRAVENOUS

## 2016-04-13 MED ORDER — ACETAMINOPHEN 500 MG PO TABS
1000.0000 mg | ORAL_TABLET | ORAL | Status: AC
  Administered 2016-04-13: 1000 mg via ORAL

## 2016-04-13 MED ORDER — DEXAMETHASONE SODIUM PHOSPHATE 10 MG/ML IJ SOLN
INTRAMUSCULAR | Status: DC | PRN
  Administered 2016-04-13: 10 mg via INTRAVENOUS

## 2016-04-13 MED ORDER — LIDOCAINE HCL (CARDIAC) 20 MG/ML IV SOLN
INTRAVENOUS | Status: DC | PRN
  Administered 2016-04-13: 60 mg via INTRAVENOUS

## 2016-04-13 MED ORDER — GABAPENTIN 300 MG PO CAPS
ORAL_CAPSULE | ORAL | Status: AC
  Administered 2016-04-13: 300 mg via ORAL
  Filled 2016-04-13: qty 1

## 2016-04-13 MED ORDER — PROPOFOL 10 MG/ML IV BOLUS
INTRAVENOUS | Status: AC
  Filled 2016-04-13: qty 20

## 2016-04-13 MED ORDER — KETOROLAC TROMETHAMINE 15 MG/ML IJ SOLN
INTRAMUSCULAR | Status: DC | PRN
  Administered 2016-04-13: 15 mg via INTRAVENOUS

## 2016-04-13 MED ORDER — GABAPENTIN 300 MG PO CAPS
300.0000 mg | ORAL_CAPSULE | ORAL | Status: AC
  Administered 2016-04-13: 300 mg via ORAL

## 2016-04-13 MED ORDER — CEFOTETAN DISODIUM-DEXTROSE 2-2.08 GM-% IV SOLR
INTRAVENOUS | Status: AC
Start: 2016-04-13 — End: 2016-04-13
  Filled 2016-04-13: qty 50

## 2016-04-13 MED ORDER — CEFOTETAN DISODIUM-DEXTROSE 2-2.08 GM-% IV SOLR
2.0000 g | INTRAVENOUS | Status: AC
  Administered 2016-04-13: 2 g via INTRAVENOUS

## 2016-04-13 SURGICAL SUPPLY — 52 items
APL SKNCLS STERI-STRIP NONHPOA (GAUZE/BANDAGES/DRESSINGS) ×1
APPLIER CLIP 5 13 M/L LIGAMAX5 (MISCELLANEOUS) ×2
BANDAGE ADH SHEER 1  50/CT (GAUZE/BANDAGES/DRESSINGS) ×2 IMPLANT
BENZOIN TINCTURE PRP APPL 2/3 (GAUZE/BANDAGES/DRESSINGS) ×2 IMPLANT
BLADE SURG ROTATE 9660 (MISCELLANEOUS) IMPLANT
CANISTER SUCTION 2500CC (MISCELLANEOUS) ×2 IMPLANT
CHLORAPREP W/TINT 26ML (MISCELLANEOUS) ×2 IMPLANT
CLIP APPLIE 5 13 M/L LIGAMAX5 (MISCELLANEOUS) ×1 IMPLANT
COVER MAYO STAND STRL (DRAPES) ×2 IMPLANT
COVER SURGICAL LIGHT HANDLE (MISCELLANEOUS) ×2 IMPLANT
DRAPE C-ARM 42X72 X-RAY (DRAPES) ×2 IMPLANT
DRSG TEGADERM 4X4.75 (GAUZE/BANDAGES/DRESSINGS) ×2 IMPLANT
ELECT REM PT RETURN 9FT ADLT (ELECTROSURGICAL) ×2
ELECTRODE REM PT RTRN 9FT ADLT (ELECTROSURGICAL) ×1 IMPLANT
ENDOLOOP SUT PDS II  0 18 (SUTURE) ×3
ENDOLOOP SUT PDS II 0 18 (SUTURE) ×3 IMPLANT
GAUZE SPONGE 2X2 8PLY STRL LF (GAUZE/BANDAGES/DRESSINGS) ×1 IMPLANT
GLOVE BIO SURGEON STRL SZ 6 (GLOVE) ×2 IMPLANT
GLOVE BIO SURGEON STRL SZ7 (GLOVE) ×2 IMPLANT
GLOVE BIO SURGEON STRL SZ7.5 (GLOVE) ×2 IMPLANT
GLOVE BIOGEL M STRL SZ7.5 (GLOVE) ×2 IMPLANT
GLOVE BIOGEL PI IND STRL 6.5 (GLOVE) ×1 IMPLANT
GLOVE BIOGEL PI IND STRL 7.5 (GLOVE) ×1 IMPLANT
GLOVE BIOGEL PI IND STRL 8 (GLOVE) ×1 IMPLANT
GLOVE BIOGEL PI INDICATOR 6.5 (GLOVE) ×1
GLOVE BIOGEL PI INDICATOR 7.5 (GLOVE) ×1
GLOVE BIOGEL PI INDICATOR 8 (GLOVE) ×1
GOWN STRL REUS W/ TWL LRG LVL3 (GOWN DISPOSABLE) ×3 IMPLANT
GOWN STRL REUS W/TWL 2XL LVL3 (GOWN DISPOSABLE) ×2 IMPLANT
GOWN STRL REUS W/TWL LRG LVL3 (GOWN DISPOSABLE) ×3
GRASPER SUT TROCAR 14GX15 (MISCELLANEOUS) IMPLANT
KIT BASIN OR (CUSTOM PROCEDURE TRAY) ×2 IMPLANT
KIT ROOM TURNOVER OR (KITS) ×2 IMPLANT
NS IRRIG 1000ML POUR BTL (IV SOLUTION) ×2 IMPLANT
PAD ARMBOARD 7.5X6 YLW CONV (MISCELLANEOUS) ×2 IMPLANT
POUCH RETRIEVAL ECOSAC 10 (ENDOMECHANICALS) ×1 IMPLANT
POUCH RETRIEVAL ECOSAC 10MM (ENDOMECHANICALS) ×1
SCISSORS LAP 5X35 DISP (ENDOMECHANICALS) ×2 IMPLANT
SET CHOLANGIOGRAPH 5 50 .035 (SET/KITS/TRAYS/PACK) ×2 IMPLANT
SET IRRIG TUBING LAPAROSCOPIC (IRRIGATION / IRRIGATOR) ×2 IMPLANT
SLEEVE ENDOPATH XCEL 5M (ENDOMECHANICALS) ×4 IMPLANT
SPECIMEN JAR SMALL (MISCELLANEOUS) ×2 IMPLANT
SPONGE GAUZE 2X2 STER 10/PKG (GAUZE/BANDAGES/DRESSINGS) ×1
STRIP CLOSURE SKIN 1/2X4 (GAUZE/BANDAGES/DRESSINGS) ×2 IMPLANT
SUT MNCRL AB 4-0 PS2 18 (SUTURE) ×2 IMPLANT
SUT VICRYL 0 UR6 27IN ABS (SUTURE) IMPLANT
TOWEL OR 17X24 6PK STRL BLUE (TOWEL DISPOSABLE) ×2 IMPLANT
TOWEL OR 17X26 10 PK STRL BLUE (TOWEL DISPOSABLE) ×2 IMPLANT
TRAY LAPAROSCOPIC MC (CUSTOM PROCEDURE TRAY) ×2 IMPLANT
TROCAR XCEL BLUNT TIP 100MML (ENDOMECHANICALS) ×2 IMPLANT
TROCAR XCEL NON-BLD 5MMX100MML (ENDOMECHANICALS) ×2 IMPLANT
TUBING INSUFFLATION (TUBING) ×2 IMPLANT

## 2016-04-13 NOTE — Transfer of Care (Signed)
Immediate Anesthesia Transfer of Care Note  Patient: [name] [name]  Procedure(s) Performed: Procedure(s): LAPAROSCOPIC CHOLECYSTECTOMY WITH INTRAOPERATIVE CHOLANGIOGRAM (N/A)  Patient Location: PACU  Anesthesia Type:General  Level of Consciousness: awake, alert , oriented and patient cooperative  Airway & Oxygen Therapy: Patient Spontanous Breathing and Patient connected to nasal cannula oxygen  Post-op Assessment: Report given to RN and Post -op Vital signs reviewed and stable  Post vital signs: Reviewed and stable  Last Vitals:  Vitals:   04/13/16 0602  BP: 131/62  Pulse: 68  Resp: 20  Temp: 36.6 C    Last Pain:  Vitals:   04/13/16 0602  TempSrc: Oral         Complications: No apparent anesthesia complications

## 2016-04-13 NOTE — Anesthesia Procedure Notes (Signed)
Procedure Name: Intubation Date/Time: 04/13/2016 7:42 AM Performed by: Shirlyn Goltz Pre-anesthesia Checklist: Patient identified, Emergency Drugs available, Suction available and Patient being monitored Patient Re-evaluated:Patient Re-evaluated prior to inductionOxygen Delivery Method: Circle system utilized Preoxygenation: Pre-oxygenation with 100% oxygen Intubation Type: IV induction Ventilation: Mask ventilation without difficulty Laryngoscope Size: Mac and 3 Grade View: Grade II Tube type: Oral Tube size: 7.0 mm Number of attempts: 1 Airway Equipment and Method: Stylet Placement Confirmation: positive ETCO2,  breath sounds checked- equal and bilateral and ETT inserted through vocal cords under direct vision Secured at: 22 cm Tube secured with: Tape Dental Injury: Teeth and Oropharynx as per pre-operative assessment

## 2016-04-13 NOTE — Op Note (Signed)
CELIN GARING [id number] 16-Sep-1950 04/13/2016  Laparoscopic Cholecystectomy with attempted  IOC Procedure Note  Indications: This patient presents with symptomatic gallbladder disease and will undergo laparoscopic cholecystectomy. Patient underwent laparoscopic sleeve gastrectomy 2 years ago. She has had significant weight loss. Recently she started developing right upper quadrant pain. She also had elevated LFTs. Her primary care physician ordered an MRCP which demonstrated a dilated common bile duct however no evidence of choledocholithiasis. An ultrasound revealed gallstones.  Pre-operative Diagnosis: Symptomatic cholelithiasis  Post-operative Diagnosis: Chronic calculus cholecystitis  Surgeon: Gayland Curry   Assistants: Romana Juniper MD  Anesthesia: General endotracheal anesthesia  Procedure Details  The patient was seen again in the Holding Room. The risks, benefits, complications, treatment options, and expected outcomes were discussed with the patient. The possibilities of reaction to medication, pulmonary aspiration, perforation of viscus, bleeding, recurrent infection, finding a normal gallbladder, the need for additional procedures, failure to diagnose a condition, the possible need to convert to an open procedure, and creating a complication requiring transfusion or operation were discussed with the patient. The likelihood of improving the patient's symptoms with return to their baseline status is good.  The patient and/or family concurred with the proposed plan, giving informed consent. The site of surgery properly noted. The patient was taken to Operating Room, identified as [name] and the procedure verified as Laparoscopic Cholecystectomy with Intraoperative Cholangiogram. A Time Out was held and the above information confirmed. Antibiotic prophylaxis was administered.   Prior to the induction of general anesthesia, antibiotic prophylaxis was administered. General  endotracheal anesthesia was then administered and tolerated well. After the induction, the abdomen was prepped with Chloraprep and draped in the sterile fashion. The patient was positioned in the supine position.  Local anesthetic agent was injected into the skin near the umbilicus and an incision made. We dissected down to the abdominal fascia with blunt dissection.  The fascia was incised vertically and we entered the peritoneal cavity bluntly.  A pursestring suture of 0-Vicryl was placed around the fascial opening.  The Hasson cannula was inserted and secured with the stay suture.  Pneumoperitoneum was then created with CO2 and tolerated well without any adverse changes in the patient's vital signs. Upon insertion of the laparoscope we identified a fair amount of omental adhesions to the abdominal wall in and around the umbilicus. I was able to navigate laparoscope up into the upper abdomen. An 5-mm port was placed in the subxiphoid position. I took down some of the thin avascular omental adhesions in the right upper quadrant for placement of the right upper quadrant trochars. Two 5-mm ports were placed in the right upper quadrant. All skin incisions were infiltrated with a local anesthetic agent before making the incision and placing the trocars. I visualize the umbilical area from the epigastric trocar. There is no bowel adhered to the abdominal wall. There is just simple omental adhesions.  We positioned the patient in reverse Trendelenburg, tilted slightly to the patient's left.  The gallbladder was identified, the fundus grasped and retracted cephalad. Adhesions were lysed bluntly and with the electrocautery where indicated, taking care not to injure any adjacent organs or viscus. The infundibulum was grasped and retracted laterally, exposing the peritoneum overlying the triangle of Calot. The patient had a very dilated cystic duct as well as a dilated appearing common duct. This was then divided and  exposed in a blunt fashion. A critical view of the cystic duct and cystic artery was obtained. We were able to  achieve a very large critical view. Given how dilated the cystic duct was decided to attempt a cholangiogram through the gallbladder itself. The cystic duct was clearly identified and bluntly dissected circumferentially.  the [facility] cholangiogram catheter introduced and placed into the body of the gallbladder and secured with a clip. A cholangiogram was then obtained which showed filling of the gallbladder however the catheter then became dislodged from the gallbladder and there is extravasation of contrast into the abdominal cavity. At this point the cholangiogram was aborted. Pneumoperitoneum was reestablished. The catheter was then removed.   The cystic artery which had been identified & dissected free was ligated with clips and divided as well. Given how large the cystic duct was it would not accommodate a clip. Therefore I divided the cystic duct just at the infundibulum. There were some stones that spilled out from the cystic duct remnant. An Endoloop was placed around the infundibulum on the gallbladder side in order to prevent stone spillage. I then placed 2 PDS Endoloops around the cystic duct remnant.   The gallbladder was dissected from the liver bed in retrograde fashion with the electrocautery. The gallbladder was removed and placed in an Ecco sac.  The gallbladder and Ecco sac were then removed through the umbilical port site. The liver bed was irrigated and inspected. Hemostasis was achieved with the electrocautery. Copious irrigation was utilized and was repeatedly aspirated until clear.  The pursestring suture was used to close the umbilical fascia.    We again inspected the right upper quadrant for hemostasis.  The umbilical closure was inspected and there was no air leak and nothing trapped within the closure. Pneumoperitoneum was released as we removed the trocars.  4-0 Monocryl was  used to close the skin.   Benzoin, steri-strips, and clean dressings were applied. The patient was then extubated and brought to the recovery room in stable condition. Instrument, sponge, and needle counts were correct at closure and at the conclusion of the case.   Findings: Chronic Cholecystitis with Cholelithiasis; normal-appearing gastric sleeve; omental adhesions, large critical view  Estimated Blood Loss: Minimal         Drains: none         Specimens: Gallbladder           Complications: None; patient tolerated the procedure well.         Disposition: PACU - hemodynamically stable.         Condition: stable  [name]. [name], MD, FACS General, Bariatric, & Minimally Invasive Surgery [facility], [place]

## 2016-04-13 NOTE — H&P (View-Only) (Signed)
[name] [name] 03/24/2016 1:42 PM Location: [facility] Patient #: [id number] DOB: 03/13/1951 Married / Language: English / Race: White Female  History of Present Illness Randall Hiss M. Omari Mcmanaway MD; 03/24/2016 6:43 PM) Patient words: GB.  The patient is a 66 year old female presenting status-post bariatric surgery. 66 yo wf s/p laparoscopic sleeve gastrectomy on 12/18/13 comes in for long term followup but mainly comes in for gallbladder issues. Her initial visit weight was 203 pounds. her preop weight was 201 pounds  She states that she has been doing very well until a few weeks ago when she started having right upper quadrant pain rating to her back. Initially would last for about 30 minutes but now is lasting hours at a time. One week she had 5 episodes in 1 week. It is occasionally associated with nausea. It is not necessarily related to food intake. This prompted her to go see her physician who ordered labs and an ultrasound. She was found to have elevated LFTs. She had an ultrasound which showed cholelithiasis. Common bile duct had a diameter of 7.9 mm. She underwent an MRCP yesterday which revealed no evidence of cholecystitis or choledocholithiasis; however, her bowel duct was dilated up to 9 mm. Her AST was mildly elevated to 42. Her ALT was elevated to 114. Her bilirubin and alkaline phosphatase were normal.  Prior to this she had not been having any abdominal pain or nausea and only mild heartburn. She states that she is taking her vitamins and supplements as recommended. She is still very happy that she underwent weight loss surgery.  Review of systems a comprehensive 12 point review of systems was performed and all systems are negative except for what is mentioned in HPI   Problem List/Past Medical Randall Hiss M. [name], MD; 03/24/2016 6:43 PM) OBESITY (BMI 30-39.9) (E66.9) High blood pressure VITAMIN D DEFICIENCY (E55.9) OSTEOPENIA (M85.80) SYMPTOMATIC  CHOLELITHIASIS (K80.20) S/P LAPAROSCOPIC SLEEVE GASTRECTOMY (Z98.84) 12/18/13; iv weight 203 lbs; preop weight 201 lbs HYPERTENSION, ESSENTIAL (I10)  Past Surgical History Randall Hiss M. [name], MD; 03/24/2016 6:42 PM) Cesarean Section - Multiple Foot Surgery Left. Hysterectomy (not due to cancer) - Partial Oral Surgery Tonsillectomy Sleeve Gastrectomy 12/18/2013 Initial visit 203 lbs; preop wt 201  Diagnostic Studies History (Azaela Caracci M. [name], MD; 03/24/2016 6:42 PM) Colonoscopy 5-10 years ago Mammogram within last year Pap Smear 1-5 years ago  Allergies (Ammie Eversole, LPN; D34-534 QA348G PM) Morphine Sulfate (Concentrate) *ANALGESICS - OPIOID* No Known Drug Allergies 03/24/2016 (Marked as Inactive)  Medication History Randall Hiss M. [name], MD; 03/24/2016 6:42 PM) Medications Reconciled Hydrocodone-Acetaminophen (5-325MG  Tablet, 1 (one) Tablet Oral every twenty-four hours, as needed, Taken starting 03/24/2016) Active. Ondansetron (4MG  Tablet Disint, 1 (one) Tablet Oral every twelve hours, as needed, Taken starting 03/24/2016) Active.  Social History Randall Hiss M. [name], MD; 03/24/2016 6:42 PM) Caffeine use Carbonated beverages, Coffee. No alcohol use No drug use Tobacco use Never smoker.  Family History Randall Hiss M. [name], MD; 03/24/2016 6:42 PM) Alcohol Abuse Father. Depression Mother, Sister. Diabetes Mellitus Mother. Heart Disease Mother. Heart disease in female family member before age 61 Hypertension Brother, Mother. Kidney Disease Mother.  Pregnancy / Birth History Randall Hiss M. [name], MD; 03/24/2016 6:42 PM) Age at menarche 9 years. Age of menopause 24-55 Gravida 3 Irregular periods Maternal age 41-30 Para 2  Other Problems Randall Hiss M. [name], MD; 03/24/2016 6:43 PM) Back Pain Chest pain Hemorrhoids Transfusion history    Vitals (Ammie Eversole LPN; D34-534 QA348G PM) 03/24/2016 1:42 PM Weight: 137.8 lb Height: 58.5in Body  Surface  Area: 1.56 m Body Mass Index: 28.31 kg/m  Temp.: 98.40F(Oral)  Pulse: 83 (Regular)  BP: 132/60 (Sitting, Left Arm, Standard)      Physical Exam Randall Hiss M. Willis Kuipers MD; 03/24/2016 6:36 PM)  General Mental Status-Alert. General Appearance-Consistent with stated age. Hydration-Well hydrated. Voice-Normal.  Head and Neck Head-normocephalic, atraumatic with no lesions or palpable masses. Trachea-midline. Thyroid Gland Characteristics - normal size and consistency.  Eye Eyeball - Bilateral-Normal. Sclera/Conjunctiva - Bilateral-No scleral icterus.  Chest and Lung Exam Chest and lung exam reveals -quiet, even and easy respiratory effort with no use of accessory muscles and on auscultation, normal breath sounds, no adventitious sounds and normal vocal resonance. Inspection Chest Wall - Normal. Back - normal.  Breast - Did not examine.  Cardiovascular Cardiovascular examination reveals -normal heart sounds, regular rate and rhythm with no murmurs and normal pedal pulses bilaterally.  Abdomen Inspection Inspection of the abdomen reveals - No Hernias. Skin - Scar - Note: well healed trocar scars. Palpation/Percussion Palpation and Percussion of the abdomen reveal - Soft, Non Tender, No Rebound tenderness, No Rigidity (guarding) and No hepatosplenomegaly. Auscultation Auscultation of the abdomen reveals - Bowel sounds normal.  Peripheral Vascular Upper Extremity Palpation - Pulses bilaterally normal.  Neurologic Neurologic evaluation reveals -alert and oriented x 3 with no impairment of recent or remote memory. Mental Status-Normal.  Neuropsychiatric The patient's mood and affect are described as -normal. Judgment and Insight-insight is appropriate concerning matters relevant to self.  Musculoskeletal Normal Exam - Left-Upper Extremity Strength Normal and Lower Extremity Strength Normal. Normal Exam - Right-Upper Extremity Strength  Normal and Lower Extremity Strength Normal.  Lymphatic Head & Neck  General Head & Neck Lymphatics: Bilateral - Description - Normal. Axillary - Did not examine. Femoral & Inguinal - Did not examine.    Assessment & Plan Randall Hiss M. Stanley Helmuth MD; 03/24/2016 6:43 PM)  S/P LAPAROSCOPIC SLEEVE GASTRECTOMY (Z98.84) Story: 12/18/13; iv weight 203 lbs; preop weight 201 lbs Impression: doing well. no signs of complications. rediscussed eating techniques such 20-20-20 ( 1 small bite, 20 chews, 20 sec b/t bites, and 20 minutes. discussed importance drinking plenty of liquids and protein. We discussed the importance of being active and walking throughut day. I will check bariatric labs during her preop visit at hospital.  [place] (K80.20) Impression: I believe the patient's symptoms are consistent with gallbladder disease.  We discussed gallbladder disease. The patient was given Neurosurgeon. We discussed non-operative and operative management. We discussed the signs & symptoms of acute cholecystitis  I discussed laparoscopic cholecystectomy with IOC in detail. The patient was given educational material as well as diagrams detailing the procedure. We discussed the risks and benefits of a laparoscopic cholecystectomy including, but not limited to bleeding, infection, injury to surrounding structures such as the intestine or liver, bile leak, retained gallstones, need to convert to an open procedure, prolonged diarrhea, blood clots such as DVT, common bile duct injury, anesthesia risks, and possible need for additional procedures. We discussed the typical post-operative recovery course. I explained that the likelihood of improvement of their symptoms is good.  The patient has elected to proceed with surgery.  I did advise her that she is more than likely to have additional attacks between now and surgery. We went over several times the difference between an attack and infection. She  was given a prescription for pain and nausea medication  Current Plans Pt Education - Pamphlet Given - Laparoscopic Gallbladder Surgery: discussed with patient and provided information. You  are being scheduled for surgery- Our schedulers will call you.  You should hear from our office's scheduling department within 5 working days about the location, date, and time of surgery. We try to make accommodations for patient's preferences in scheduling surgery, but sometimes the OR schedule or the surgeon's schedule prevents [place] from making those accommodations.  If you have not heard from our office [phone]) in 5 working days, call the office and ask for your surgeon's nurse.  If you have other questions about your diagnosis, plan, or surgery, call the office and ask for your surgeon's nurse.  Started Hydrocodone-Acetaminophen 5-325MG , 1 (one) Tablet every twenty-four hours, as needed, #15, 03/24/2016, No Refill. Started Ondansetron 4MG , 1 (one) Tablet every twelve hours, as needed, #15, 03/24/2016, No Refill. HYPERTENSION, ESSENTIAL (I10) Impression: improved. no longer on meds  [name]. [name], MD, FACS General, Bariatric, & Minimally Invasive Surgery [facility], [place]

## 2016-04-13 NOTE — Interval H&P Note (Signed)
History and Physical Interval Note:  04/13/2016 7:18 AM  [name] [name]  has presented today for surgery, with the diagnosis of SYMPTOMATIC CHOLELITHIASIS  The various methods of treatment have been discussed with the patient and family. After consideration of risks, benefits and other options for treatment, the patient has consented to  Procedure(s): LAPAROSCOPIC CHOLECYSTECTOMY WITH INTRAOPERATIVE CHOLANGIOGRAM (N/A) as a surgical intervention .  The patient's history has been reviewed, patient examined, no change in status, stable for surgery.  I have reviewed the patient's chart and labs.  Questions were answered to the patient's satisfaction.    [name]. [name], MD, [place], Bariatric, & Minimally Invasive Surgery [facility], [place]    [facility] M

## 2016-04-13 NOTE — Anesthesia Postprocedure Evaluation (Addendum)
Anesthesia Post Note  Patient: [name]  Procedure(s) Performed: Procedure(s) (LRB): LAPAROSCOPIC CHOLECYSTECTOMY WITH ATTEMPTED INTRAOPERATIVE CHOLANGIOGRAM (N/A)  Patient location during evaluation: PACU Anesthesia Type: General Level of consciousness: awake and alert Pain management: pain level controlled Vital Signs Assessment: post-procedure vital signs reviewed and stable Respiratory status: spontaneous breathing, nonlabored ventilation, respiratory function stable and patient connected to nasal cannula oxygen Cardiovascular status: blood pressure returned to baseline and stable Postop Assessment: no signs of nausea or vomiting Anesthetic complications: no       Last Vitals:  Vitals:   04/13/16 1100 04/13/16 1110  BP: 140/76 (!) 144/65  Pulse: 60 60  Resp: 12 12  Temp: 36.6 C     Last Pain:  Vitals:   04/13/16 1115  TempSrc:   PainSc: 2                  Cyla Haluska,JAMES TERRILL

## 2016-04-13 NOTE — Discharge Instructions (Signed)
[facility] °LAPAROSCOPIC SURGERY: POST OP INSTRUCTIONS °Always review your discharge instruction sheet given to you by the facility where your surgery was performed. °IF YOU HAVE DISABILITY OR FAMILY LEAVE FORMS, YOU MUST BRING THEM TO THE OFFICE FOR PROCESSING.   °DO NOT GIVE THEM TO YOUR DOCTOR. ° °1. A prescription for pain medication may be given to you upon discharge.  Take your pain medication as prescribed, if needed.  If narcotic pain medicine is not needed, then you may take acetaminophen (Tylenol) &/ or ibuprofen (Advil) as needed. °2. Take your usually prescribed medications unless otherwise directed. °3. If you need a refill on your pain medication, please contact your pharmacy.  They will contact our office to request authorization. Prescriptions will not be filled after 5pm or on week-ends. °4. You should follow a light diet the first few days after arrival home, such as soup and crackers, etc.  Be sure to include lots of fluids daily. °5. Most patients will experience some swelling and bruising in the area of the incisions.  Ice packs will help.  Swelling and bruising can take several days to resolve.  °6. It is common to experience some constipation if taking pain medication after surgery.  Increasing fluid intake and taking a stool softener (such as Colace) will usually help or prevent this problem from occurring.  A mild laxative (Milk of Magnesia or Miralax) should be taken according to package instructions if there are no bowel movements after 48 hours. °7. Unless discharge instructions indicate otherwise, you may remove your bandages 48 hours after surgery, and you may shower at that time.  You  have steri-strips (small skin tapes) in place directly over the incision.  These strips should be left on the skin for 7-10 days.  °8. ACTIVITIES:  You may resume regular (light) daily activities beginning the next day--such as daily self-care, walking, climbing stairs--gradually  increasing activities as tolerated.  You may have sexual intercourse when it is comfortable.  Refrain from any heavy lifting or straining until approved by your doctor. °a. You may drive when you are no longer taking prescription pain medication, you can comfortably wear a seatbelt, and you can safely maneuver your car and apply brakes. °9. You should see your doctor in the office for a follow-up appointment approximately 2-3 weeks after your surgery.  Make sure that you call for this appointment within a day or two after you arrive home to insure a convenient appointment time. °10. OTHER INSTRUCTIONS:  °WHEN TO CALL YOUR DOCTOR: °1. Fever over 101.0 °2. Inability to urinate °3. Continued bleeding from incision. °4. Increased pain, redness, or drainage from the incision. °5. Increasing abdominal pain ° °The clinic staff is available to answer your questions during regular business hours.  Please don’t hesitate to call and ask to speak to one of the nurses for clinical concerns.  If you have a medical emergency, go to the nearest emergency room or call 911.  A surgeon from [facility] is always on call at the hospital. °[address], [address], [place], [place]  [address] ? [address] [address], [place],    [address] °[phone] ? [phone] ? FAX [phone] °Web site: [email or web address] ° ° ° ° ° °

## 2016-04-13 NOTE — Anesthesia Preprocedure Evaluation (Signed)
Anesthesia Evaluation  Patient identified by MRN, date of birth, ID band Patient awake    Reviewed: Allergy & Precautions, NPO status , Patient's Chart, lab work & pertinent test results  Airway Mallampati: I  TM Distance: >3 FB Neck ROM: Full    Dental  (+) Teeth Intact   Pulmonary    breath sounds clear to auscultation       Cardiovascular hypertension,  Rhythm:Regular Rate:Normal     Neuro/Psych  Neuromuscular disease    GI/Hepatic   Endo/Other    Renal/GU      Musculoskeletal   Abdominal   Peds  Hematology   Anesthesia Other Findings   Reproductive/Obstetrics                             Anesthesia Physical Anesthesia Plan  ASA: II  Anesthesia Plan: General   Post-op Pain Management:    Induction: Intravenous  Airway Management Planned: Oral ETT  Additional Equipment:   Intra-op Plan:   Post-operative Plan: Extubation in OR  Informed Consent:   Dental advisory given  Plan Discussed with:   Anesthesia Plan Comments:         Anesthesia Quick Evaluation

## 2016-04-14 ENCOUNTER — Encounter (HOSPITAL_COMMUNITY): Payer: Self-pay | Admitting: General Surgery

## 2016-04-23 ENCOUNTER — Other Ambulatory Visit: Payer: Self-pay | Admitting: Family Medicine

## 2016-04-23 DIAGNOSIS — Z1231 Encounter for screening mammogram for malignant neoplasm of breast: Secondary | ICD-10-CM

## 2016-05-03 ENCOUNTER — Ambulatory Visit (INDEPENDENT_AMBULATORY_CARE_PROVIDER_SITE_OTHER): Payer: 59 | Admitting: Podiatry

## 2016-05-03 ENCOUNTER — Ambulatory Visit (INDEPENDENT_AMBULATORY_CARE_PROVIDER_SITE_OTHER): Payer: 59

## 2016-05-03 VITALS — BP 114/78 | HR 73

## 2016-05-03 DIAGNOSIS — R52 Pain, unspecified: Secondary | ICD-10-CM | POA: Diagnosis not present

## 2016-05-03 DIAGNOSIS — M722 Plantar fascial fibromatosis: Secondary | ICD-10-CM | POA: Diagnosis not present

## 2016-05-03 MED ORDER — MELOXICAM 15 MG PO TABS: 15.0000 mg | ORAL_TABLET | Freq: Every day | ORAL | 3 refills | Status: DC

## 2016-05-03 MED ORDER — METHYLPREDNISOLONE 4 MG PO TBPK: ORAL_TABLET | ORAL | 0 refills | Status: DC

## 2016-05-03 NOTE — Progress Notes (Signed)
   Subjective:    Patient ID: [name] [name], female    DOB: Nov 10, 1950, 66 y.o.   MRN: [id number]  HPI: She presents today with chief complaint of pain to the right heel 1-2 months states that is worse in the morning and then toward the end of the day she also relates that she has had pain to the second and third interdigital space of the left foot going on to say that she had a fractured second digit which did not heal appropriately. She states this was on the left foot. She also states that her left foot had undergone severe trauma nearly ripping off at the midfoot. She states that a podiatrist put it back together for her and she is very happy with the outcome. She knows that she has some arthritis in the foot but is able to tolerate that.  Review of Systems  All other systems reviewed and are negative.      Objective:   Physical Exam: Vital signs are stable alert and oriented 3 no apparent distress. Pulses are palpable. Neurologic sensorium is intact. Deep tendon reflexes are intact. Muscle strength is 5 over 5 dorsiflexion plantar flexors and inverters everters on physical musculatures intact. Severe pain on palpation medial calcaneal tubercle of the right heel. Mild tenderness on palpation of the third digit and metatarsophalangeal joints left she also has considerable discomfort on attempted frontal plane range of motion of the midfoot left. Radiographs of the bilateral feet taken today demonstrate no major osseous abnormalities of the severe osteoarthritic changes of Lisfranc's joints to the left foot. A fractured proximal phalanx second digit left this has healed with hypertrophy. She also has a soft tissue increase in density of plantar fascia origin insertion site of the right heel. No open lesions or wounds.        Assessment & Plan:  Assessment: Plantar fascitis right. Capsulitis forefoot left.  Plan: Injected the right heel today with Kenalog and local anesthetic. Plantar  fascia brace and nicely were dispensed. Dispensed a prescription for Medrol Dosepak to be followed by meloxicam. Discussed appropriate shoe gear stretching exercises ice therapy and shoe gear modifications.

## 2016-05-04 ENCOUNTER — Ambulatory Visit
Admission: RE | Admit: 2016-05-04 | Discharge: 2016-05-04 | Disposition: A | Discharge status: Home / Self Care | Payer: 59 | Source: Ambulatory Visit | Attending: Family Medicine | Admitting: Family Medicine

## 2016-05-04 ENCOUNTER — Encounter: Payer: Self-pay | Admitting: Family Medicine

## 2016-05-04 DIAGNOSIS — H6123 Impacted cerumen, bilateral: Secondary | ICD-10-CM | POA: Insufficient documentation

## 2016-05-04 DIAGNOSIS — H9011 Conductive hearing loss, unilateral, right ear, with unrestricted hearing on the contralateral side: Secondary | ICD-10-CM | POA: Insufficient documentation

## 2016-05-04 DIAGNOSIS — Z1231 Encounter for screening mammogram for malignant neoplasm of breast: Secondary | ICD-10-CM

## 2016-05-04 DIAGNOSIS — H900 Conductive hearing loss, bilateral: Secondary | ICD-10-CM | POA: Insufficient documentation

## 2016-05-11 DIAGNOSIS — F4323 Adjustment disorder with mixed anxiety and depressed mood: Secondary | ICD-10-CM | POA: Diagnosis not present

## 2016-05-19 ENCOUNTER — Telehealth: Payer: 59 | Admitting: Nurse Practitioner

## 2016-05-19 ENCOUNTER — Encounter: Payer: Self-pay | Admitting: Family Medicine

## 2016-05-19 DIAGNOSIS — J019 Acute sinusitis, unspecified: Secondary | ICD-10-CM

## 2016-05-19 MED ORDER — AUGMENTIN 875-125 MG PO TABS: 1.0000 | ORAL_TABLET | Freq: Two times a day (BID) | ORAL | 0 refills | Status: DC

## 2016-05-19 NOTE — Progress Notes (Signed)
We are sorry that you are not feeling well.  Here is how we plan to help!  Based on what you have shared with me it looks like you have sinusitis.  Sinusitis is inflammation and infection in the sinus cavities of the head.  Based on your presentation I believe you most likely have Acute Viral Sinusitis.This is an infection most likely caused by a virus. There is not specific treatment for viral sinusitis other than to help you with the symptoms until the infection runs its course.  You may use an oral decongestant such as Mucinex D or if you have glaucoma or high blood pressure use plain Mucinex. Saline nasal spray help and can safely be used as often as needed for congestion, I would like you to continue using Flonase (fluticasone) nasal spray, 2 sprays in each nostril daily for an additional 10 days.  Because your symptoms have persisted, I am also prescribing Amoxicillin/Clavulanic Acid 875/125mg  twice daily for 10 days.  Some authorities believe that zinc sprays or the use of Echinacea may shorten the course of your symptoms.  Sinus infections are not as easily transmitted as other respiratory infection, however we still recommend that you avoid close contact with loved ones, especially the very young and elderly.  Remember to wash your hands thoroughly throughout the day as this is the number one way to prevent the spread of infection!  Home Care:  Only take medications as instructed by your medical team.  Complete the entire course of an antibiotic.  Do not take these medications with alcohol.  A steam or ultrasonic humidifier can help congestion.  You can place a towel over your head and breathe in the steam from hot water coming from a faucet.  Avoid close contacts especially the very young and the elderly.  Cover your mouth when you cough or sneeze.  Always remember to wash your hands.  Get Help Right Away If:  You develop worsening fever or sinus pain.  You develop a severe head  ache or visual changes.  Your symptoms persist after you have completed your treatment plan.  Make sure you  Understand these instructions.  Will watch your condition.  Will get help right away if you are not doing well or get worse.  Your e-visit answers were reviewed by a board certified advanced clinical practitioner to complete your personal care plan.  Depending on the condition, your plan could have included both over the counter or prescription medications.  If there is a problem please reply  once you have received a response from your provider.  Your safety is important to [place].  If you have drug allergies check your prescription carefully.    You can use MyChart to ask questions about today's visit, request a non-urgent call back, or ask for a work or school excuse for 24 hours related to this e-Visit. If it has been greater than 24 hours you will need to follow up with your provider, or enter a new e-Visit to address those concerns.  You will get an e-mail in the next two days asking about your experience.  I hope that your e-visit has been valuable and will speed your recovery. Thank you for using e-visits.

## 2016-05-27 ENCOUNTER — Other Ambulatory Visit (INDEPENDENT_AMBULATORY_CARE_PROVIDER_SITE_OTHER): Payer: 59

## 2016-05-27 ENCOUNTER — Ambulatory Visit (INDEPENDENT_AMBULATORY_CARE_PROVIDER_SITE_OTHER): Payer: 59 | Admitting: Internal Medicine

## 2016-05-27 ENCOUNTER — Other Ambulatory Visit: Payer: Self-pay | Admitting: Internal Medicine

## 2016-05-27 ENCOUNTER — Encounter: Payer: Self-pay | Admitting: Internal Medicine

## 2016-05-27 ENCOUNTER — Encounter: Payer: Self-pay | Admitting: Family Medicine

## 2016-05-27 VITALS — BP 110/70 | HR 88 | Ht <= 58 in | Wt 138.0 lb

## 2016-05-27 DIAGNOSIS — D729 Disorder of white blood cells, unspecified: Secondary | ICD-10-CM | POA: Diagnosis not present

## 2016-05-27 DIAGNOSIS — Z1211 Encounter for screening for malignant neoplasm of colon: Secondary | ICD-10-CM

## 2016-05-27 DIAGNOSIS — R10811 Right upper quadrant abdominal tenderness: Secondary | ICD-10-CM | POA: Diagnosis not present

## 2016-05-27 DIAGNOSIS — R1013 Epigastric pain: Secondary | ICD-10-CM | POA: Diagnosis not present

## 2016-05-27 DIAGNOSIS — Z9884 Bariatric surgery status: Secondary | ICD-10-CM

## 2016-05-27 DIAGNOSIS — K5909 Other constipation: Secondary | ICD-10-CM

## 2016-05-27 DIAGNOSIS — Z800 Family history of malignant neoplasm of digestive organs: Secondary | ICD-10-CM | POA: Diagnosis not present

## 2016-05-27 DIAGNOSIS — R1011 Right upper quadrant pain: Secondary | ICD-10-CM | POA: Diagnosis not present

## 2016-05-27 LAB — COMPREHENSIVE METABOLIC PANEL
ALBUMIN: 4.5 g/dL (ref 3.5–5.2)
ALT: 16 U/L (ref 0–35)
AST: 19 U/L (ref 0–37)
Alkaline Phosphatase: 82 U/L (ref 39–117)
BILIRUBIN TOTAL: 0.4 mg/dL (ref 0.2–1.2)
BUN: 13 mg/dL (ref 6–23)
CALCIUM: 9.2 mg/dL (ref 8.4–10.5)
CO2: 29 meq/L (ref 19–32)
CREATININE: 0.68 mg/dL (ref 0.40–1.20)
Chloride: 105 mEq/L (ref 96–112)
GFR: 92.58 mL/min (ref >60.00)
Glucose, Bld: 101 mg/dL — ABNORMAL HIGH (ref 70–99)
Potassium: 4.2 mEq/L (ref 3.5–5.1)
Sodium: 141 mEq/L (ref 135–145)
TOTAL PROTEIN: 6.7 g/dL (ref 6.0–8.3)

## 2016-05-27 LAB — CBC WITH DIFFERENTIAL/PLATELET
BASOS ABS: 0 10*3/uL (ref 0.0–0.1)
Basophils Relative: 0.5 % (ref 0.0–3.0)
EOS ABS: 0.1 10*3/uL (ref 0.0–0.7)
Eosinophils Relative: 1.5 % (ref 0.0–5.0)
HEMATOCRIT: 39.8 % (ref 36.0–46.0)
Hemoglobin: 13.8 g/dL (ref 12.0–15.0)
LYMPHS ABS: 4.3 10*3/uL — AB (ref 0.7–4.0)
MCHC: 34.6 g/dL (ref 30.0–36.0)
MCV: 94.9 fl (ref 78.0–100.0)
Monocytes Absolute: 0.6 10*3/uL (ref 0.1–1.0)
Monocytes Relative: 8.6 % (ref 3.0–12.0)
NEUTROS ABS: 2.4 10*3/uL (ref 1.4–7.7)
NEUTROS PCT: 31.6 % — AB (ref 43.0–77.0)
PLATELETS: 356 10*3/uL (ref 150.0–400.0)
RBC: 4.19 Mil/uL (ref 3.87–5.11)
RDW: 13.9 % (ref 11.5–15.5)
WBC: 7.5 10*3/uL (ref 4.0–10.5)

## 2016-05-27 LAB — LIPASE: Lipase: 60 U/L — ABNORMAL HIGH (ref 11.0–59.0)

## 2016-05-27 LAB — AMYLASE: AMYLASE: 59 U/L (ref 27–131)

## 2016-05-27 MED ORDER — PANTOPRAZOLE SODIUM 20 MG PO TBEC: 20.0000 mg | DELAYED_RELEASE_TABLET | Freq: Every day | ORAL | 1 refills | Status: DC

## 2016-05-27 NOTE — Progress Notes (Signed)
My chart note on labs

## 2016-05-27 NOTE — Progress Notes (Signed)
[name] [name] 66 y.o. 03/05/1951 [id number]  Assessment & Plan:   Encounter Diagnoses  Name Primary?  . RUQ pain Yes  . Right upper quadrant abdominal tenderness without rebound tenderness   . S/P bariatric surgery-gastric sleeve   . Dyspepsia   . Chronic constipation   . Colon cancer screening   . Family history of colon cancer-brother at age 64     He is currently having problems with abdominal pains after her laparoscopic cholecystectomy, these are of unclear etiology. Patulous fairly wide, it seems late for a complication of the surgery like a leak of the symptoms sound possibly like that, she could also have a retained common bile duct stone, or perhaps there something if he is of like although that's early or related to prior bariatric surgery.  We'll start workup with lab testing will include lipase, amylase, CBC and CMET Abdominal pelvic CT scan with contrast is ordered as well I think this will give [place] the best information given all the possibilities of her problems  Could end up needing an EGD, her screening colonoscopy is elective and we will work that in as appropriate.  Her chronic constipation I think taking a bisacodyl every 3 days or so is very reasonable it seems to work. It sounds like she may have some sort of slow transit problem given that MiraLAX and fiber make things worse.  I appreciate the opportunity to care for this patient. CC: Arnette Norris, MD Greer Pickerel M.D.  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 She had severe intense abdominal pain biliary colic was found to have gallstones and in January had a laparoscopic cholecystectomy by Dr. [name]. Intraoperative cholangiogram was attempted but unsuccessful. She had a preoperative MRCP  images viewed showing gallstones, multiple tiny ones, with 9 mm common duct dilation. Was done after an ultrasound showed some bile duct dilation. Liver tests were elevated then with AST 42 and ALT 114 and she was symptomatic, but resolved prior to the cholecystectomy area stop She said she felt great for 2 weeks and then has started to have soreness and pain in the upper abdomen area, especially on the right side, maybe in the mid abdomen. If she eats it might make it worse or better there is no real rhyme or reason to it. It can be intense at times. It's not going away and it can awaken her. This lesion is mildly annoying but she wonders once going on. She suffers from chronic constipation, she takes Dulcolax with relief every 3-5 days. Hasn't been any nausea or vomiting reported. No fevers.  She also notes her brother was just diagnosed with colon cancer at age 61.  Allergies  Allergen Reactions  . Morphine And Related Nausea Only and Other (See Comments)    DIZZINESS HYPOTENSION HYPOXIA   Past Medical History:  Diagnosis Date  . Anemia    IN THE PAST WITH PREGNANCY  . Arthritis    LEFT FOOT; OCCAS LOWER BACK PAIN - HX OF VERTEBRAL FRACTURES  . Blood transfusion without reported diagnosis    DURING PREGNANCY  . Complication of anesthesia    a little sluggist when waking up-no intervention required  . Coronary artery spasm (HCC) 1990'S   AFTER OTC DIET PILLS - PT EXPERIENCED CHEST PAIN- PT HAD  CARDIAC CATH - TOLD NO CAD - THOUGHT TO HAVE HAS CORNARY ARTERY SPASM  . H/O seasonal allergies   . Headache(784.0)    OCCAS SINUS HEADACHES  . Hearing impaired    LEFT EAR DEAFNESS  . Hypertension    220/104 IN SPRING 2015 - SAW HER DOCTOR - BUT B/P NORMALIZED - PT DECIDED TO STOP CAFFEINE AND HAS NOT HAD ANY ELEVATED PRESSURES SINCE JUNE  . Osteopenia   . Strabismus    HAD STRABISMUS SURGERY 1956 - BUT STILL HAS STRABISMUS  . Vitamin D deficiency    Past Surgical History:  Procedure  Laterality Date  . ABDOMINAL HYSTERECTOMY    . APPENDECTOMY    . BREATH TEK H PYLORI N/A 10/08/2013   Procedure: BREATH TEK H PYLORI;  Surgeon: Gayland Curry, MD;  Location: [facility];  Service: General;  Laterality: N/A;  . BREATH TEK H PYLORI N/A 12/03/2013   Procedure: BREATH TEK Kandis Ban;  Surgeon: Gayland Curry, MD;  Location: [facility];  Service: General;  Laterality: N/A;  . CHOLECYSTECTOMY N/A 04/13/2016   Procedure: LAPAROSCOPIC CHOLECYSTECTOMY WITH ATTEMPTED INTRAOPERATIVE CHOLANGIOGRAM;  Surgeon: Greer Pickerel, MD;  Location: [place];  Service: General;  Laterality: N/A;  . EYE SURGERY Bilateral 2017   cataract surgery with lens implant  . INNER EAR SURGERY Left 1965   RADICAL MASTOIDECTOMY  . LAPAROSCOPIC GASTRIC SLEEVE RESECTION N/A 12/18/2013   Procedure: LAPAROSCOPIC GASTRIC SLEEVE RESECTION;  Surgeon: Greer Pickerel, MD;  Location: [facility];  Service: General;  Laterality: N/A;  . REPAIR TENDONS FOOT Right 1994   AND RECONSTRUCTIVE SURGERY FOR MULTIPLE FRACTURES OF THE RIGHT FOOT  . strabismus repair    . TONSILLECTOMY     Social History   Social History  . Marital status: Married    Spouse name: N/A  . Number of children: 2  . Years of education: N/A   Occupational History  . RN [employer]   Social History Main Topics  . Smoking status: Never Smoker  . Smokeless tobacco: Never Used  . Alcohol use No  . Drug use: No  . Sexual activity: Not Asked   Other Topics Concern  . 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   Review of Systems As above all other review of systems appear negative at  this time  Objective:   Physical Exam @BP  110/70   Pulse 88   Ht 4\' 10"  (1.473 m)   Wt 138 lb (62.6 kg)   BMI 28.84 kg/m @  General:  Well-developed, well-nourished and in no acute distress Eyes:  anicteric. ENT:   Mouth and posterior pharynx free of lesions.  Neck:   supple w/o thyromegaly or mass.  Lungs: Clear to auscultation bilaterally. Heart:  S1S2, no rubs, murmurs, gallops. Abdomen:  soft, tender RUQ, mid quad w/o rebound - mod, no hepatosplenomegaly, hernia, or mass and BS+.  Rectal: Patti [place], [place] present. NL anoderm Neg anal wink NL tone and squeeze - some stricture felt NL desecnt and sim defecation Lymph:  no cervical or supraclavicular adenopathy. Extremities:   no edema, cyanosis or  clubbing Skin   no rash. Neuro:  A&O x 3.  Psych:  appropriate mood and  Affect.   Data Reviewed: See history of present illness. I have reviewed op notes, xrays and imaging results (imnages directly of MRCP)

## 2016-05-27 NOTE — Patient Instructions (Addendum)
   Your physician has requested that you go to the basement for the following lab work before leaving today: CBC/diff, CMET, Amylase, Lipase    You have been scheduled for a CT scan of the abdomen and pelvis at [facility] ([address]---this is in the same building as [facility]).   You are scheduled on 06/03/16 at 1:30PM. You should arrive 15 minutes prior to your appointment time for registration. Please follow the written instructions below on the day of your exam:  WARNING: IF YOU ARE ALLERGIC TO IODINE/X-RAY DYE, PLEASE NOTIFY RADIOLOGY IMMEDIATELY AT [phone]! YOU WILL BE GIVEN A 13 HOUR PREMEDICATION PREP.  1) Do not eat or drink anything after 9:30AM (4 hours prior to your test), Liquids okay 2) You have been given 2 bottles of oral contrast to drink. The solution may taste better if refrigerated, but do NOT add ice or any other liquid to this solution. Shake  well before drinking.    Drink 1 bottle of contrast @ 11:30AM  (2 hours prior to your exam)  Drink 1 bottle of contrast @ 12:30PM (1 hour prior to your exam)  You may take any medications as prescribed with a small amount of water except for the following: Metformin, Glucophage, Glucovance, Avandamet, Riomet, Fortamet, Actoplus Met, Janumet, Glumetza or Metaglip. The above medications must be held the day of the exam AND 48 hours after the exam.  The purpose of you drinking the oral contrast is to aid in the visualization of your intestinal tract. The contrast solution may cause some diarrhea. Before your exam is started, you will be given a small amount of fluid to drink. Depending on your individual set of symptoms, you may also receive an intravenous injection of x-ray contrast/dye. Plan on being at [facility] for 30 minutes or longer, depending on the type of exam you are having performed.  This test typically takes 30-45 minutes to complete.  If you have any questions regarding your exam  or if you need to reschedule, you may call the CT department at [phone] between the hours of 8:00 am and 5:00 pm, Monday-Friday.  ________________________________________________________________________  I appreciate the opportunity to care for you. Carl Gessner, MD, FACG  

## 2016-05-28 ENCOUNTER — Encounter: Payer: Self-pay | Admitting: Internal Medicine

## 2016-05-28 LAB — PATHOLOGIST SMEAR REVIEW

## 2016-06-01 ENCOUNTER — Encounter: Payer: Self-pay | Admitting: Family Medicine

## 2016-06-01 ENCOUNTER — Encounter: Payer: Self-pay | Admitting: Internal Medicine

## 2016-06-01 ENCOUNTER — Other Ambulatory Visit: Payer: Self-pay | Admitting: Internal Medicine

## 2016-06-01 ENCOUNTER — Other Ambulatory Visit: Payer: Self-pay

## 2016-06-01 NOTE — Progress Notes (Signed)
I have spoken to her about these results  She needs the test that is written here  I looked at it and found it but looks like we should check with lab that it can be done her  Dx: absolute lymphocytosis and atypical lymphocytes  She is w/o sxs Has been present since 2016  Once we get it ordered let her know to come for that

## 2016-06-02 ENCOUNTER — Encounter: Payer: Self-pay | Admitting: Internal Medicine

## 2016-06-02 ENCOUNTER — Ambulatory Visit (INDEPENDENT_AMBULATORY_CARE_PROVIDER_SITE_OTHER): Payer: 59 | Admitting: Family Medicine

## 2016-06-02 DIAGNOSIS — R888 Abnormal findings in other body fluids and substances: Secondary | ICD-10-CM | POA: Diagnosis not present

## 2016-06-02 DIAGNOSIS — D7289 Other specified disorders of white blood cells: Secondary | ICD-10-CM | POA: Insufficient documentation

## 2016-06-02 NOTE — Patient Instructions (Signed)
Great to see you. Please stop by to see [name] [name] on your way out.   

## 2016-06-02 NOTE — Progress Notes (Signed)
[name] [name] has reviewed w/ her PCP and they have decided to do a hematology evaluation.  So no testing from [place].

## 2016-06-02 NOTE — Progress Notes (Signed)
Subjective:   Patient ID: [name] [name], female    DOB: 02/19/1951, 66 y.o.   MRN: [id number]  [name] [name] is a pleasant 66 y.o. year old female who presents to clinic today with Abnormal Lab  on 06/02/2016  HPI:  Abnormal labs- During Dr. Celesta Aver work up, CBC abnormal- path smear showed-  Absolute lymphocytosis and atypical lymphocytes. Suggest  immunophenotyping by flow cytometry if a new/persistent finding. RBCs  and platelets are unremarkable.   Reviewed by Odis Hollingshead, MD, PhD, FCAP (Electronic Signature on  File) 05/28/16   Lab Results  Component Value Date   WBC 7.5 05/27/2016   HGB 13.8 05/27/2016   HCT 39.8 05/27/2016   MCV 94.9 05/27/2016   PLT 356.0 05/27/2016   Lymphocytes have been mildly elevated with low neutrophils for several months but smear has never been abnormal.  She feels fine- not tired. She is still having the abdominal pain Dr. Carlean Purl has been working up- scheduled for CT tomorrow.  Denies noticing any swollen lymph nodes.  No night sweats or fevers. Current Outpatient Prescriptions on File Prior to Visit  Medication Sig Dispense Refill  . calcium-vitamin D (OSCAL WITH D) 500-200 MG-UNIT per tablet Take 2 tablets by mouth daily.    . Cyanocobalamin (VITAMIN B 12 PO) Take by mouth daily.    Marland Kitchen docusate sodium (COLACE) 100 MG capsule Take 100 mg by mouth 3 (three) times daily as needed for mild constipation.     . fluticasone (FLONASE) 50 MCG/ACT nasal spray Place 2 sprays into both nostrils daily. Reported on 04/17/2015    . Ibuprofen-Diphenhydramine Cit (MOTRIN PM) 200-38 MG TABS Take by mouth at bedtime as needed.    . Multiple Vitamin (MULTIVITAMIN) capsule Take 1 capsule by mouth daily. When she remembers    . pantoprazole (PROTONIX) 20 MG tablet Take 1 tablet (20 mg total) by mouth daily before breakfast. 30 tablet 1  . Polyethyl Glycol-Propyl Glycol (SYSTANE OP) Place 1 drop into both eyes daily as needed (dry eyes).      No current facility-administered medications on file prior to visit.     Allergies  Allergen Reactions  . Morphine And Related Nausea Only and Other (See Comments)    DIZZINESS HYPOTENSION HYPOXIA  . Augmentin [Amoxicillin-Pot Clavulanate] Diarrhea and Nausea Only    Past Medical History:  Diagnosis Date  . Anemia    IN THE PAST WITH PREGNANCY  . Arthritis    LEFT FOOT; OCCAS LOWER BACK PAIN - HX OF VERTEBRAL FRACTURES  . Blood transfusion without reported diagnosis    DURING PREGNANCY  . Complication of anesthesia    a little sluggist when waking up-no intervention required  . Coronary artery spasm (HCC) 1990'S   AFTER OTC DIET PILLS - PT EXPERIENCED CHEST PAIN- PT HAD CARDIAC CATH - TOLD NO CAD - THOUGHT TO HAVE HAS CORNARY ARTERY SPASM  . H/O seasonal allergies   . Headache(784.0)    OCCAS SINUS HEADACHES  . Hearing impaired    LEFT EAR DEAFNESS  . Hypertension    220/104 IN SPRING 2015 - SAW HER DOCTOR - BUT B/P NORMALIZED - PT DECIDED TO STOP CAFFEINE AND HAS NOT HAD ANY ELEVATED PRESSURES SINCE JUNE  . Osteopenia   . Strabismus    HAD STRABISMUS SURGERY 1956 - BUT STILL HAS STRABISMUS  . Vitamin D deficiency     Past Surgical History:  Procedure Laterality Date  . ABDOMINAL HYSTERECTOMY    . APPENDECTOMY    .  BREATH TEK H PYLORI N/A 10/08/2013   Procedure: BREATH TEK H PYLORI;  Surgeon: Gayland Curry, MD;  Location: [facility];  Service: General;  Laterality: N/A;  . BREATH TEK H PYLORI N/A 12/03/2013   Procedure: BREATH TEK Kandis Ban;  Surgeon: Gayland Curry, MD;  Location: [facility];  Service: General;  Laterality: N/A;  . CHOLECYSTECTOMY N/A 04/13/2016   Procedure: LAPAROSCOPIC CHOLECYSTECTOMY WITH ATTEMPTED INTRAOPERATIVE CHOLANGIOGRAM;  Surgeon: Greer Pickerel, MD;  Location: [place];  Service: General;  Laterality: N/A;  . EYE SURGERY Bilateral 2017   cataract surgery with lens implant  . INNER EAR SURGERY Left 1965   RADICAL MASTOIDECTOMY  . LAPAROSCOPIC  GASTRIC SLEEVE RESECTION N/A 12/18/2013   Procedure: LAPAROSCOPIC GASTRIC SLEEVE RESECTION;  Surgeon: Greer Pickerel, MD;  Location: [facility];  Service: General;  Laterality: N/A;  . REPAIR TENDONS FOOT Right 1994   AND RECONSTRUCTIVE SURGERY FOR MULTIPLE FRACTURES OF THE RIGHT FOOT  . strabismus repair    . TONSILLECTOMY      Family History  Problem Relation Age of Onset  . Hypertension Mother   . Diabetes Mother   . Obesity Mother   . Heart disease Mother   . Alcohol abuse Father   . COPD Other   . Hyperlipidemia Other   . Colon cancer Brother 4  . Stomach cancer Neg Hx   . Rectal cancer Neg Hx   . Esophageal cancer Neg Hx   . Liver cancer Neg Hx     Social History   Social History  . Marital status: Married    Spouse name: N/A  . Number of children: 2  . Years of education: N/A   Occupational History  . RN [employer]   Social History Main Topics  . Smoking status: Never Smoker  . Smokeless tobacco: Never Used  . Alcohol use No  . Drug use: No  . Sexual activity: Not on file   Other Topics Concern  . 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   Married with 2 offspring   The PMH, PSH, Social History, Family History, Medications, and allergies have been reviewed in [facility], and have been updated if relevant.   Review of Systems  Constitutional: Negative.   Gastrointestinal: Positive for abdominal pain.  Neurological: Negative.   Hematological: Negative for adenopathy.  All other systems reviewed and are negative.      Objective:    BP 100/78   Pulse 78   Ht 4\' 10"  (1.473 m)   Wt 139 lb (63 kg)   SpO2 98%   BMI 29.05 kg/m    Physical Exam  Constitutional: She is oriented to person, place, and time. She appears well-developed and well-nourished. No distress.  HENT:  Head: Normocephalic and atraumatic.  Eyes: Conjunctivae are normal.    Cardiovascular: Normal rate.   Pulmonary/Chest: Effort normal.  Lymphadenopathy:       Head (right side): No submental, no submandibular, no tonsillar, no preauricular, no posterior auricular and no occipital adenopathy present.       Head (left side): No submental, no submandibular, no tonsillar, no preauricular, no posterior auricular and no occipital adenopathy present.    She has no cervical adenopathy.       Right: No supraclavicular adenopathy present.       Left: No supraclavicular adenopathy present.  Neurological: She is alert and oriented to  person, place, and time. No cranial nerve deficit.  Skin: Skin is warm and dry. She is not diaphoretic.  Psychiatric: She has a normal mood and affect. Her behavior is normal. Judgment and thought content normal.  Nursing note and vitals reviewed.         Assessment & Plan:   Atypical lymphocytes present on peripheral blood smear - Plan: Ambulatory referral to Hematology No Follow-up on file.

## 2016-06-02 NOTE — Assessment & Plan Note (Signed)
?   Indolent myleodysplastic syndrome- will refer to hematology for further work up/flow cytometry. The patient indicates understanding of these issues and agrees with the plan. Orders Placed This Encounter  Procedures  . Ambulatory referral to Hematology

## 2016-06-02 NOTE — Progress Notes (Signed)
No further testing - going to hematology See below

## 2016-06-03 ENCOUNTER — Encounter: Payer: Self-pay | Admitting: Family Medicine

## 2016-06-03 ENCOUNTER — Ambulatory Visit (INDEPENDENT_AMBULATORY_CARE_PROVIDER_SITE_OTHER)
Admission: RE | Admit: 2016-06-03 | Discharge: 2016-06-03 | Disposition: A | Discharge status: Home / Self Care | Payer: 59 | Source: Ambulatory Visit | Attending: Internal Medicine | Admitting: Internal Medicine

## 2016-06-03 DIAGNOSIS — R1011 Right upper quadrant pain: Secondary | ICD-10-CM

## 2016-06-03 DIAGNOSIS — R109 Unspecified abdominal pain: Secondary | ICD-10-CM | POA: Diagnosis not present

## 2016-06-03 DIAGNOSIS — R10811 Right upper quadrant abdominal tenderness: Secondary | ICD-10-CM | POA: Diagnosis not present

## 2016-06-03 MED ORDER — IOPAMIDOL (ISOVUE-300) INJECTION 61%
100.0000 mL | Freq: Once | INTRAVENOUS | Status: AC | PRN
  Administered 2016-06-03: 100 mL via INTRAVENOUS

## 2016-06-03 NOTE — Progress Notes (Signed)
CT ok My Chart note

## 2016-06-04 ENCOUNTER — Telehealth: Payer: Self-pay

## 2016-06-04 NOTE — Telephone Encounter (Signed)
I am forwarding this message to Webb Laws , [facility] up front and she is going to handle this for me.

## 2016-06-04 NOTE — Telephone Encounter (Signed)
-----   Message from Gatha Mayer, MD sent at 06/04/2016 10:21 AM EST ----- Regarding: schedule colonoscopy Please set up a screening colonoscopy direct - needs previsit etc  Dx is FHx colon cancer/screening

## 2016-06-07 ENCOUNTER — Ambulatory Visit (INDEPENDENT_AMBULATORY_CARE_PROVIDER_SITE_OTHER): Payer: 59 | Admitting: Podiatry

## 2016-06-07 ENCOUNTER — Encounter: Payer: Self-pay | Admitting: Podiatry

## 2016-06-07 DIAGNOSIS — M779 Enthesopathy, unspecified: Secondary | ICD-10-CM

## 2016-06-07 DIAGNOSIS — M722 Plantar fascial fibromatosis: Secondary | ICD-10-CM | POA: Diagnosis not present

## 2016-06-07 NOTE — Progress Notes (Signed)
She presents today for follow-up of her plantar fasciitis bilaterally. She states that she is 100% improved. She states that she stopped wearing the plantar fascia brace last week.  Objective: Vital signs are stable alert and oriented 3. Pulses are palpable. Neurologic sensorium is intact. Deep tendon reflexes are intact muscle strength is normal. She has no pain on palpation medial calcaneal tubercles bilateral.  Assessment: Well healing plantar fasciitis near 100%.  Plan: Continue all conservative therapies for another month follow-up with me as needed.

## 2016-06-08 ENCOUNTER — Encounter: Payer: Self-pay | Admitting: Internal Medicine

## 2016-06-08 NOTE — Telephone Encounter (Signed)
Pt is scheduled on 08-09-16 and aware of appointment

## 2016-06-13 ENCOUNTER — Encounter: Payer: Self-pay | Admitting: Family Medicine

## 2016-06-14 ENCOUNTER — Encounter: Payer: Self-pay | Admitting: Family Medicine

## 2016-06-17 ENCOUNTER — Other Ambulatory Visit (HOSPITAL_COMMUNITY)
Admission: RE | Admit: 2016-06-17 | Discharge: 2016-06-17 | Disposition: A | Discharge status: Home / Self Care | Payer: 59 | Source: Ambulatory Visit | Attending: Oncology | Admitting: Oncology

## 2016-06-17 ENCOUNTER — Telehealth: Payer: Self-pay | Admitting: Oncology

## 2016-06-17 ENCOUNTER — Encounter: Payer: Self-pay | Admitting: Oncology

## 2016-06-17 ENCOUNTER — Ambulatory Visit (HOSPITAL_BASED_OUTPATIENT_CLINIC_OR_DEPARTMENT_OTHER): Payer: 59

## 2016-06-17 ENCOUNTER — Ambulatory Visit (HOSPITAL_BASED_OUTPATIENT_CLINIC_OR_DEPARTMENT_OTHER): Payer: 59 | Admitting: Oncology

## 2016-06-17 VITALS — BP 138/77 | HR 86 | Temp 98.2°F | Resp 16 | Ht <= 58 in | Wt 139.5 lb

## 2016-06-17 DIAGNOSIS — Z029 Encounter for administrative examinations, unspecified: Secondary | ICD-10-CM | POA: Diagnosis not present

## 2016-06-17 DIAGNOSIS — D72820 Lymphocytosis (symptomatic): Secondary | ICD-10-CM

## 2016-06-17 DIAGNOSIS — R1011 Right upper quadrant pain: Secondary | ICD-10-CM

## 2016-06-17 DIAGNOSIS — Z8719 Personal history of other diseases of the digestive system: Secondary | ICD-10-CM | POA: Diagnosis not present

## 2016-06-17 LAB — CBC WITH DIFFERENTIAL/PLATELET
BASO%: 0.1 % (ref 0.0–2.0)
BASOS ABS: 0 10*3/uL (ref 0.0–0.1)
EOS%: 0.4 % (ref 0.0–7.0)
Eosinophils Absolute: 0 10*3/uL (ref 0.0–0.5)
HCT: 40.9 % (ref 34.8–46.6)
HEMOGLOBIN: 13.7 g/dL (ref 11.6–15.9)
LYMPH#: 4.1 10*3/uL — AB (ref 0.9–3.3)
LYMPH%: 45.4 % (ref 14.0–49.7)
MCH: 32.1 pg (ref 25.1–34.0)
MCHC: 33.5 g/dL (ref 31.5–36.0)
MCV: 95.8 fL (ref 79.5–101.0)
MONO#: 0.7 10*3/uL (ref 0.1–0.9)
MONO%: 7.7 % (ref 0.0–14.0)
NEUT#: 4.1 10*3/uL (ref 1.5–6.5)
NEUT%: 46.4 % (ref 38.4–76.8)
Platelets: 296 10*3/uL (ref 145–400)
RBC: 4.27 10*6/uL (ref 3.70–5.45)
RDW: 13.7 % (ref 11.2–14.5)
WBC: 8.9 10*3/uL (ref 3.9–10.3)
nRBC: 0 % (ref 0–0)

## 2016-06-17 LAB — MORPHOLOGY
PLT EST: ADEQUATE
RBC Comments: NORMAL

## 2016-06-17 LAB — LACTATE DEHYDROGENASE: LDH: 171 U/L (ref 125–245)

## 2016-06-17 NOTE — Progress Notes (Signed)
[facility] Initial Visit:  Patient Care Team: Lucille Passy, MD as PCP - General  CHIEF COMPLAINTS/PURPOSE OF CONSULTATION: Evaluation of atypical lymphocytosis.   No history exists.    HISTORY OF PRESENTING ILLNESS:   [name] [name] 66 y.o. female is here because of  recent diagnosis of atypical lymphocytosis. Patient t has recently undergone a laparoscopic cholecystectomy on January 9 of 2018. Since then patient has been  experiencing right upper quadrant discomfort/pain.  However patient denies fever, chills, night sweats, nausea, vomiting, bright red blood per rectum, melena or hematochezia. Apparently patient was told that her CBC was within normal range prior to her surgical procedure. Patient never had any history of further lymphocytosis or leukocytosis in the past.  Recently her brother was diagnosed with colon cancer. She has no family history of  leukemia or lymphoma.  Patient  is a Marine scientist by occupation,  continue to work.  Patient denies any recent weight loss, night  sweats are lymph gland enlargement.  I have reviewed the recent laboratory data performed on 05/27/2016 has revealed WBC count of 7.5, hemoglobin 13.8 g and platelet count of 356,000. Differential count has revealed 31.6% of neutrophils and 57.8% of lymphocytes. Her smear was also evaluated by pathologist, confirmed the presence of atypical lymphocytes. The patient was advised to have a hematology evaluation.  Review of Systems  Constitutional: Negative.  Negative for appetite change, chills, diaphoresis, fatigue and fever.  HENT:   Negative for hearing loss, lump/mass, mouth sores, nosebleeds, sore throat, tinnitus, trouble swallowing and voice change.   Eyes: Negative for eye problems and icterus.  Respiratory: Negative for chest tightness, cough, hemoptysis, shortness of breath and wheezing.   Cardiovascular: Negative for chest pain, leg swelling and palpitations.  Gastrointestinal:  Positive for abdominal pain (Complains of right upper quadrant discomfort since her cholecystectomy.). Negative for blood in stool, constipation, diarrhea, nausea, rectal pain and vomiting.  Genitourinary: Negative for bladder incontinence, dysuria, frequency, hematuria and pelvic pain.   Musculoskeletal: Negative for arthralgias, back pain, flank pain, gait problem, myalgias, neck pain and neck stiffness.  Skin: Negative for itching and rash.  Neurological: Negative for dizziness, extremity weakness, gait problem, headaches, light-headedness, numbness, seizures and speech difficulty.  Hematological: Negative for adenopathy. Does not bruise/bleed easily.  Psychiatric/Behavioral: Negative for confusion, decreased concentration, depression and sleep disturbance. The patient is not nervous/anxious.       MEDICAL HISTORY: Past Medical History:  Diagnosis Date  . Anemia    IN THE PAST WITH PREGNANCY  . Arthritis    LEFT FOOT; OCCAS LOWER BACK PAIN - HX OF VERTEBRAL FRACTURES  . Blood transfusion without reported diagnosis    DURING PREGNANCY  . Complication of anesthesia    a little sluggist when waking up-no intervention required  . Coronary artery spasm (HCC) 1990'S   AFTER OTC DIET PILLS - PT EXPERIENCED CHEST PAIN- PT HAD CARDIAC CATH - TOLD NO CAD - THOUGHT TO HAVE HAS CORNARY ARTERY SPASM  . H/O seasonal allergies   . Headache(784.0)    OCCAS SINUS HEADACHES  . Hearing impaired    LEFT EAR DEAFNESS  . Hypertension    220/104 IN SPRING 2015 - SAW HER DOCTOR - BUT B/P NORMALIZED - PT DECIDED TO STOP CAFFEINE AND HAS NOT HAD ANY ELEVATED PRESSURES SINCE JUNE  . Osteopenia   . Strabismus    HAD STRABISMUS SURGERY 1956 - BUT STILL HAS STRABISMUS  . Vitamin D deficiency     SURGICAL HISTORY:  Past Surgical History:  Procedure Laterality Date  . ABDOMINAL HYSTERECTOMY    . APPENDECTOMY    . BREATH TEK H PYLORI N/A 10/08/2013   Procedure: BREATH TEK H PYLORI;  Surgeon: Gayland Curry,  MD;  Location: [facility];  Service: General;  Laterality: N/A;  . BREATH TEK H PYLORI N/A 12/03/2013   Procedure: BREATH TEK Kandis Ban;  Surgeon: Gayland Curry, MD;  Location: [facility];  Service: General;  Laterality: N/A;  . CHOLECYSTECTOMY N/A 04/13/2016   Procedure: LAPAROSCOPIC CHOLECYSTECTOMY WITH ATTEMPTED INTRAOPERATIVE CHOLANGIOGRAM;  Surgeon: Greer Pickerel, MD;  Location: [facility];  Service: General;  Laterality: N/A;  . EYE SURGERY Bilateral 2017   cataract surgery with lens implant  . INNER EAR SURGERY Left 1965   RADICAL MASTOIDECTOMY  . LAPAROSCOPIC GASTRIC SLEEVE RESECTION N/A 12/18/2013   Procedure: LAPAROSCOPIC GASTRIC SLEEVE RESECTION;  Surgeon: Greer Pickerel, MD;  Location: [facility];  Service: General;  Laterality: N/A;  . REPAIR TENDONS FOOT Right 1994   AND RECONSTRUCTIVE SURGERY FOR MULTIPLE FRACTURES OF THE RIGHT FOOT  . strabismus repair    . TONSILLECTOMY      SOCIAL HISTORY: Social History   Social History  . Marital status: Married    Spouse name: N/A  . Number of children: 2  . Years of education: N/A   Occupational History  . RN [employer]   Social History Main Topics  . Smoking status: Never Smoker  . Smokeless tobacco: Never Used  . Alcohol use No  . Drug use: No  . Sexual activity: Not on file   Other Topics Concern  . 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 Hypertension Mother   . Diabetes Mother   . Obesity Mother   . Heart disease Mother   . Alcohol abuse Father   . COPD Other   . Hyperlipidemia Other   . Colon cancer Brother 98  . Stomach cancer Neg Hx   . Rectal cancer Neg Hx   . Esophageal cancer Neg Hx   . Liver cancer Neg Hx     ALLERGIES:  is allergic to morphine and related and augmentin [amoxicillin-pot  clavulanate].  MEDICATIONS:  Current Outpatient Prescriptions  Medication Sig Dispense Refill  . calcium-vitamin D (OSCAL WITH D) 500-200 MG-UNIT per tablet Take 2 tablets by mouth daily.    . Cyanocobalamin (VITAMIN B 12 PO) Take by mouth daily.    Marland Kitchen docusate sodium (COLACE) 100 MG capsule Take 100 mg by mouth 3 (three) times daily as needed for mild constipation.     . fluticasone (FLONASE) 50 MCG/ACT nasal spray Place 2 sprays into both nostrils daily. Reported on 04/17/2015    . Ibuprofen-Diphenhydramine Cit (MOTRIN PM) 200-38 MG TABS Take by mouth at bedtime as needed.    . pantoprazole (PROTONIX) 20 MG tablet Take 1 tablet (20 mg total) by mouth daily before breakfast. 30 tablet 1  . Polyethyl Glycol-Propyl Glycol (SYSTANE OP) Place 1 drop into both eyes daily as needed (dry eyes).     No current facility-administered medications for this visit.     PHYSICAL EXAMINATION:  ECOG PERFORMANCE STATUS: 0 - Asymptomatic   Vitals:   06/17/16 1304  BP: 138/77  Pulse: 86  Resp: 16  Temp: 98.2  F (36.8 C)    Filed Weights   06/17/16 1304  Weight: 139 lb 8 oz (63.3 kg)     Physical Exam  Constitutional: She is oriented to person, place, and time. No distress.  HENT:  Head: Normocephalic and atraumatic.  Mouth/Throat: Oropharynx is clear and moist. No oropharyngeal exudate.  Eyes: Conjunctivae and EOM are normal. Pupils are equal, round, and reactive to light. Right eye exhibits no discharge. Left eye exhibits no discharge.  Neck: Normal range of motion. Neck supple. No JVD present. No tracheal deviation present. No thyromegaly present.  Cardiovascular: Normal rate, regular rhythm, normal heart sounds and intact distal pulses.  Exam reveals no gallop and no friction rub.   No murmur heard. Pulmonary/Chest: Effort normal and breath sounds normal. No stridor. No respiratory distress. She has no wheezes. She has no rales. She exhibits no tenderness.  Abdominal: Soft. She exhibits  no distension and no mass. There is tenderness (Minimal tenderness  present in the right upper quadrant on deep palpation.). There is no rebound and no guarding.  Musculoskeletal: Normal range of motion. She exhibits no edema, tenderness or deformity.  Lymphadenopathy:    She has no cervical adenopathy.  Neurological: She is alert and oriented to person, place, and time. No cranial nerve deficit. Gait normal. Coordination normal.  Skin: Skin is warm and dry. No rash noted. She is not diaphoretic. No erythema. There is pallor.  Psychiatric: Mood, memory, affect and judgment normal.     LABORATORY DATA: I have personally reviewed the data as listed:  Appointment on 06/17/2016  Component Date Value Ref Range Status  . WBC 06/17/2016 8.9  3.9 - 10.3 10e3/uL Final  . NEUT# 06/17/2016 4.1  1.5 - 6.5 10e3/uL Final  . HGB 06/17/2016 13.7  11.6 - 15.9 g/dL Final  . HCT 06/17/2016 40.9  34.8 - 46.6 % Final  . Platelets 06/17/2016 296  145 - 400 10e3/uL Final  . MCV 06/17/2016 95.8  79.5 - 101.0 fL Final  . MCH 06/17/2016 32.1  25.1 - 34.0 pg Final  . MCHC 06/17/2016 33.5  31.5 - 36.0 g/dL Final  . RBC 06/17/2016 4.27  3.70 - 5.45 10e6/uL Final  . RDW 06/17/2016 13.7  11.2 - 14.5 % Final  . lymph# 06/17/2016 4.1* 0.9 - 3.3 10e3/uL Final  . MONO# 06/17/2016 0.7  0.1 - 0.9 10e3/uL Final  . Eosinophils Absolute 06/17/2016 0.0  0.0 - 0.5 10e3/uL Final  . Basophils Absolute 06/17/2016 0.0  0.0 - 0.1 10e3/uL Final  . NEUT% 06/17/2016 46.4  38.4 - 76.8 % Final  . LYMPH% 06/17/2016 45.4  14.0 - 49.7 % Final  . MONO% 06/17/2016 7.7  0.0 - 14.0 % Final  . EOS% 06/17/2016 0.4  0.0 - 7.0 % Final  . BASO% 06/17/2016 0.1  0.0 - 2.0 % Final  . nRBC 06/17/2016 0  0 - 0 % Final  Lab on 05/27/2016  Component Date Value Ref Range Status  . Amylase 05/27/2016 59  27 - 131 U/L Final  . Lipase 05/27/2016 60.0* 11.0 - 59.0 U/L Final  . Sodium 05/27/2016 141  135 - 145 mEq/L Final  . Potassium 05/27/2016 4.2   3.5 - 5.1 mEq/L Final  . Chloride 05/27/2016 105  96 - 112 mEq/L Final  . CO2 05/27/2016 29  19 - 32 mEq/L Final  . Glucose, Bld 05/27/2016 101* 70 - 99 mg/dL Final  . BUN 05/27/2016 13  6 - 23 mg/dL Final  . Creatinine,  Ser 05/27/2016 0.68  0.40 - 1.20 mg/dL Final  . Total Bilirubin 05/27/2016 0.4  0.2 - 1.2 mg/dL Final  . Alkaline Phosphatase 05/27/2016 82  39 - 117 U/L Final  . AST 05/27/2016 19  0 - 37 U/L Final  . ALT 05/27/2016 16  0 - 35 U/L Final  . Total Protein 05/27/2016 6.7  6.0 - 8.3 g/dL Final  . Albumin 05/27/2016 4.5  3.5 - 5.2 g/dL Final  . Calcium 05/27/2016 9.2  8.4 - 10.5 mg/dL Final  . GFR 05/27/2016 92.58  >60.00 mL/min Final  . WBC 05/27/2016 7.5  4.0 - 10.5 K/uL Final  . RBC 05/27/2016 4.19  3.87 - 5.11 Mil/uL Final  . Hemoglobin 05/27/2016 13.8  12.0 - 15.0 g/dL Final  . HCT 05/27/2016 39.8  36.0 - 46.0 % Final  . MCV 05/27/2016 94.9  78.0 - 100.0 fl Final  . MCHC 05/27/2016 34.6  30.0 - 36.0 g/dL Final  . RDW 05/27/2016 13.9  11.5 - 15.5 % Final  . Platelets 05/27/2016 356.0  150.0 - 400.0 K/uL Final  . Neutrophils Relative % 05/27/2016 31.6* 43.0 - 77.0 % Final  . Lymphocytes Relative 05/27/2016 57.8 abnormal cells  noted on smear referred for pathology review.* 12.0 - 46.0 % Final  . Monocytes Relative 05/27/2016 8.6  3.0 - 12.0 % Final  . Eosinophils Relative 05/27/2016 1.5  0.0 - 5.0 % Final  . Basophils Relative 05/27/2016 0.5  0.0 - 3.0 % Final  . Neutro Abs 05/27/2016 2.4  1.4 - 7.7 K/uL Final  . Lymphs Abs 05/27/2016 4.3* 0.7 - 4.0 K/uL Final  . Monocytes Absolute 05/27/2016 0.6  0.1 - 1.0 K/uL Final  . Eosinophils Absolute 05/27/2016 0.1  0.0 - 0.7 K/uL Final  . Basophils Absolute 05/27/2016 0.0  0.0 - 0.1 K/uL Final  . Path Review 05/27/2016 SEE NOTE   Final   Comment: Absolute lymphocytosis and atypical lymphocytes. Suggest immunophenotyping by flow cytometry if a new/persistent finding. RBCs and platelets are unremarkable.   Reviewed by Odis Hollingshead, MD, PhD, FCAP (Electronic Signature on File) 05/28/16      ASSESSMENT/PLAN   1. Newly diagnosed atypical lymphocytosis. 2. Status post cholecystectomy.  Marlowe Cinquemani is a 66 year old Caucasian woman was recently diagnosed with the atypical lymphocytosis with a normal WBC count. At this time the etiology of her atypical lymphocytosis is not clear. At this time patient is a relatively asymptomatic. My physical examination did not reveal no lymphadenopathy or hepatosplenomegaly.  At this time it appears most likely could be related to some form of a viral infection.   I have ordered repeat CBC with the peripheral smear examination.  I have also ordered a peripheral blood for flow cytometry to rule out the possibility of early lymphoproliferative disorder.  I have made a follow-up appointment in the outpatient clinic in 1 week to review the about test results. I once again thank her primary care provider for giving me the opportunity to participate in her care.    Orders Placed This Encounter  Procedures  . CBC with Differential    Standing Status:   Future    Number of Occurrences:   1    Standing Expiration Date:   06/17/2017  . Save smear  . Beta 2 microglobulin    Standing Status:   Future    Number of Occurrences:   1    Standing Expiration Date:   06/17/2017  . Lactate dehydrogenase (LDH)  Standing Status:   Future    Number of Occurrences:   1    Standing Expiration Date:   06/17/2017  . Flow Cytometry    Standing Status:   Future    Number of Occurrences:   1    Standing Expiration Date:   06/17/2017    Scheduling Instructions:     Leukemia / Lymphoma panel    All questions were answered. The patient knows to call the clinic with any problems, questions or concerns.  This note was electronically signed.    Judge Stall, MD  06/17/2016 2:20 PM

## 2016-06-17 NOTE — Telephone Encounter (Signed)
Appointments scheduled per 3/15 LOS. Patient given AVS report and calendars with future scheduled appointments. °

## 2016-06-18 ENCOUNTER — Encounter: Payer: Self-pay | Admitting: Family Medicine

## 2016-06-18 LAB — BETA 2 MICROGLOBULIN, SERUM: Beta-2: 1.4 mg/L (ref 0.6–2.4)

## 2016-06-24 ENCOUNTER — Encounter: Payer: Self-pay | Admitting: Family Medicine

## 2016-06-24 ENCOUNTER — Encounter (HOSPITAL_COMMUNITY): Payer: Self-pay | Admitting: Family Medicine

## 2016-06-24 ENCOUNTER — Encounter: Payer: Self-pay | Admitting: Podiatry

## 2016-06-24 ENCOUNTER — Telehealth: Payer: Self-pay | Admitting: Oncology

## 2016-06-24 ENCOUNTER — Ambulatory Visit (HOSPITAL_COMMUNITY)
Admission: EM | Admit: 2016-06-24 | Discharge: 2016-06-24 | Disposition: A | Discharge status: Home / Self Care | Payer: 59 | Attending: Family Medicine | Admitting: Family Medicine

## 2016-06-24 ENCOUNTER — Ambulatory Visit (INDEPENDENT_AMBULATORY_CARE_PROVIDER_SITE_OTHER): Payer: 59

## 2016-06-24 ENCOUNTER — Ambulatory Visit (HOSPITAL_BASED_OUTPATIENT_CLINIC_OR_DEPARTMENT_OTHER): Payer: 59 | Admitting: Oncology

## 2016-06-24 VITALS — BP 138/77 | HR 82 | Temp 98.2°F | Resp 18 | Ht <= 58 in | Wt 139.0 lb

## 2016-06-24 DIAGNOSIS — R7989 Other specified abnormal findings of blood chemistry: Secondary | ICD-10-CM | POA: Diagnosis not present

## 2016-06-24 DIAGNOSIS — S82891A Other fracture of right lower leg, initial encounter for closed fracture: Secondary | ICD-10-CM | POA: Diagnosis not present

## 2016-06-24 DIAGNOSIS — D72829 Elevated white blood cell count, unspecified: Secondary | ICD-10-CM

## 2016-06-24 DIAGNOSIS — S82831A Other fracture of upper and lower end of right fibula, initial encounter for closed fracture: Secondary | ICD-10-CM | POA: Diagnosis not present

## 2016-06-24 MED ORDER — TRAMADOL HCL 50 MG PO TABS: 50.0000 mg | ORAL_TABLET | Freq: Four times a day (QID) | ORAL | 0 refills | Status: DC | PRN

## 2016-06-24 MED ORDER — ACETAMINOPHEN 325 MG PO TABS
650.0000 mg | ORAL_TABLET | Freq: Once | ORAL | Status: AC
  Administered 2016-06-24: 650 mg via ORAL

## 2016-06-24 MED ORDER — ACETAMINOPHEN 325 MG PO TABS
ORAL_TABLET | ORAL | Status: AC
  Filled 2016-06-24: qty 2

## 2016-06-24 NOTE — Progress Notes (Signed)
[facility] Follow up:    [name] Norris, MD [place] [address]  INTERVAL HISTORY: [name] [name] 66 y.o. female returns for scheduled follow-up visit.   [name] [name] 66 y.o. female is here because of recent diagnosis of atypical lymphocytosis. Patientt has recently undergone a laparoscopic cholecystectomy on January 9 of 2018. Since then patient has been  experiencing right upper quadrant discomfort/pain.  However patient denies fever, chills, night sweats, nausea, vomiting, bright red blood per rectum, melena or hematochezia. Apparently patient was told that her CBC was within normal range prior to her surgical procedure. Patient never had any history of further lymphocytosis or leukocytosis in the past.  I have reviewed the recent laboratory data performed on 05/27/2016 has revealed WBC count of 7.5, hemoglobin 13.8 g and platelet count of 356,000. Differential count has revealed 31.6% of neutrophils and 57.8% of lymphocytes. Her smear was also evaluated by pathologist, confirmed the presence of atypical lymphocytes.    I have repeated CBC, which has revealed presence of atypical lymphocytes.  Enzymes have ordered peripheral blood flow cytometry which has revealed no evidence of a monoclonal cells.  At this time patient denies any new complaints. Patient feels her energy level is as usual.  Patient Active Problem List   Diagnosis Date Noted  . Atypical lymphocytes present on peripheral blood smear 06/02/2016  . Family history of colon cancer-brother at age 96 05/27/2016  . Hematuria 11/25/2015  . Abdominal pain, right upper quadrant 11/25/2015  . UTI (urinary tract infection), uncomplicated 68[birth year]34[birth year]1962  . Well woman exam 12/18/2014  . Right knee pain 04/29/2014  . S/P laparoscopic sleeve gastrectomy 12/18/2013  . Persistent disorder of initiating or maintaining sleep 11/05/2013  . Obesity, Class III, BMI 40-49.9 (morbid obesity)  ([place]) 09/12/2013  . Encounter for routine gynecological examination 08/14/2013  . CORONARY ARTERY SPASM 10/17/2009  . OSTEOPENIA 10/17/2009    is allergic to morphine and related and augmentin [amoxicillin-pot clavulanate].  MEDICAL HISTORY: Past Medical History:  Diagnosis Date  . Anemia    IN THE PAST WITH PREGNANCY  . Arthritis    LEFT FOOT; OCCAS LOWER BACK PAIN - HX OF VERTEBRAL FRACTURES  . Blood transfusion without reported diagnosis    DURING PREGNANCY  . Complication of anesthesia    a little sluggist when waking up-no intervention required  . Coronary artery spasm (HCC) 1990'S   AFTER OTC DIET PILLS - PT EXPERIENCED CHEST PAIN- PT HAD CARDIAC CATH - TOLD NO CAD - THOUGHT TO HAVE HAS CORNARY ARTERY SPASM  . H/O seasonal allergies   . Headache(784.0)    OCCAS SINUS HEADACHES  . Hearing impaired    LEFT EAR DEAFNESS  . Hypertension    220/104 IN SPRING 2015 - SAW HER DOCTOR - BUT B/P NORMALIZED - PT DECIDED TO STOP CAFFEINE AND HAS NOT HAD ANY ELEVATED PRESSURES SINCE JUNE  . Osteopenia   . Strabismus    HAD STRABISMUS SURGERY 1956 - BUT STILL HAS STRABISMUS  . Vitamin D deficiency     SURGICAL HISTORY: Past Surgical History:  Procedure Laterality Date  . ABDOMINAL HYSTERECTOMY    . APPENDECTOMY    . BREATH TEK H PYLORI N/A 10/08/2013   Procedure: BREATH TEK H PYLORI;  Surgeon: Gayland Curry, MD;  Location: [facility];  Service: General;  Laterality: N/A;  . BREATH TEK H PYLORI N/A 12/03/2013   Procedure: Lauris Chroman;  Surgeon: Gayland Curry, MD;  Location:  [facility];  Service: General;  Laterality: N/A;  . CHOLECYSTECTOMY N/A 04/13/2016   Procedure: LAPAROSCOPIC CHOLECYSTECTOMY WITH ATTEMPTED INTRAOPERATIVE CHOLANGIOGRAM;  Surgeon: Greer Pickerel, MD;  Location: [facility];  Service: General;  Laterality: N/A;  . EYE SURGERY Bilateral 2017   cataract surgery with lens implant  . INNER EAR SURGERY Left 1965   RADICAL MASTOIDECTOMY  . LAPAROSCOPIC GASTRIC SLEEVE  RESECTION N/A 12/18/2013   Procedure: LAPAROSCOPIC GASTRIC SLEEVE RESECTION;  Surgeon: Greer Pickerel, MD;  Location: [facility];  Service: General;  Laterality: N/A;  . REPAIR TENDONS FOOT Right 1994   AND RECONSTRUCTIVE SURGERY FOR MULTIPLE FRACTURES OF THE RIGHT FOOT  . strabismus repair    . TONSILLECTOMY      SOCIAL HISTORY: Social History   Social History  . Marital status: Married    Spouse name: N/A  . Number of children: 2  . Years of education: N/A   Occupational History  . RN [employer]   Social History Main Topics  . Smoking status: Never Smoker  . Smokeless tobacco: Never Used  . Alcohol use No  . Drug use: No  . Sexual activity: Not on file   Other Topics Concern  . 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 Hypertension Mother   . Diabetes Mother   . Obesity Mother   . Heart disease Mother   . Alcohol abuse Father   . COPD Other   . Hyperlipidemia Other   . Colon cancer Brother 61  . Stomach cancer Neg Hx   . Rectal cancer Neg Hx   . Esophageal cancer Neg Hx   . Liver cancer Neg Hx     Review of Systems  Constitutional: Negative for appetite change, chills, diaphoresis, fatigue, fever and unexpected weight change.  HENT:   Negative for hearing loss, lump/mass, mouth sores, nosebleeds, sore throat, tinnitus, trouble swallowing and voice change.   Eyes: Negative for icterus.  Respiratory: Negative for chest tightness, cough, hemoptysis, shortness of breath and wheezing.   Cardiovascular: Negative for chest pain and leg swelling.  Gastrointestinal: Negative for abdominal distention, abdominal pain, blood in stool, constipation, diarrhea, nausea, rectal pain and vomiting.  Genitourinary: Negative for bladder incontinence, difficulty urinating, dysuria,  frequency, hematuria, nocturia and pelvic pain.   Musculoskeletal: Negative for arthralgias, back pain, flank pain, gait problem, myalgias, neck pain and neck stiffness.  Skin: Negative for itching and rash.  Neurological: Negative for dizziness, gait problem, headaches, light-headedness, numbness and seizures.  Hematological: Negative for adenopathy. Does not bruise/bleed easily.  Psychiatric/Behavioral: Negative for confusion, depression, sleep disturbance and suicidal ideas. The patient is not nervous/anxious.       PHYSICAL EXAMINATION  ECOG PERFORMANCE STATUS: 0 - Asymptomatic  Vitals:   06/24/16 0928  BP: 138/77  Pulse: 82  Resp: 18  Temp: 98.2 F (36.8 C)    Physical Exam  LABORATORY DATA:  CBC    Component Value Date/Time   WBC 8.9 06/17/2016 1403   WBC 7.5 05/27/2016 0926   RBC 4.27 06/17/2016 1403   RBC 4.19 05/27/2016 0926   HGB 13.7 06/17/2016 1403   HCT 40.9 06/17/2016 1403   PLT 296 06/17/2016 1403   MCV 95.8 06/17/2016 1403   MCH 32.1 06/17/2016 1403   MCH 31.7 04/09/2016  0900   MCHC 33.5 06/17/2016 1403   MCHC 34.6 05/27/2016 0926   RDW 13.7 06/17/2016 1403   LYMPHSABS 4.1 (H) 06/17/2016 1403   MONOABS 0.7 06/17/2016 1403   EOSABS 0.0 06/17/2016 1403   BASOSABS 0.0 06/17/2016 1403    CMP     Component Value Date/Time   NA 141 05/27/2016 0926   K 4.2 05/27/2016 0926   CL 105 05/27/2016 0926   CO2 29 05/27/2016 0926   GLUCOSE 101 (H) 05/27/2016 0926   BUN 13 05/27/2016 0926   CREATININE 0.68 05/27/2016 0926   CALCIUM 9.2 05/27/2016 0926   PROT 6.7 05/27/2016 0926   ALBUMIN 4.5 05/27/2016 0926   AST 19 05/27/2016 0926   ALT 16 05/27/2016 0926   ALKPHOS 82 05/27/2016 0926   BILITOT 0.4 05/27/2016 0926   GFRNONAA >60 04/09/2016 0900   GFRAA >60 04/09/2016 0900       PENDING LABS:   RADIOGRAPHIC STUDIES:  Dg Ankle Complete Right  Result Date: 06/24/2016 CLINICAL DATA:  Twisted ankle stepping off curb. Unable bear weight. EXAM:  RIGHT ANKLE - COMPLETE 3+ VIEW COMPARISON:  None. FINDINGS: PA nondisplaced fracture is present in the distal fibula. Extensive soft swelling is present over the lateral malleolus. Large joint effusion is present. No additional fractures are seen. A small plantar calcaneal spur is noted. Mild degenerative changes are not have a midfoot. IMPRESSION: 1. Nondisplaced fracture in the distal fibula. 2. Extensive soft tissue swelling over the lateral malleolus. 3. Moderate-sized joint effusion. Electronically Signed   By: San Morelle M.D.   On: 06/24/2016 14:59      ASSESSMENT and THERAPY PLAN:    1. Newly diagnosed atypical lymphocytosis. 2. Status post cholecystectomy.  Pearlina Friedly is a 66 year old Caucasian woman was recently diagnosed with the atypical lymphocytosis with a normal WBC count. At this time the etiology of her atypical lymphocytosis is not clear. At this time patient is a relatively asymptomatic. My physical examination did not reveal no lymphadenopathy or hepatosplenomegaly.  Peripheral blood flow cytometry did not reveal any monoclonal cells. Hence I have reassured the patient. I have advised her to have a follow-up CBC in 3 months.   All questions were answered. The patient knows to call the clinic with any problems, questions or concerns. We can certainly see the patient much sooner if necessary. This note was electronically signed.   Judge Stall, MD 06/24/2016

## 2016-06-24 NOTE — Discharge Instructions (Signed)
The right fibula is a nondisplaced fracture at the tip of the bone. This should heal over the next 6 weeks by keeping it immobilized and keeping weight off of the foot.

## 2016-06-24 NOTE — ED Provider Notes (Signed)
[name] [name]    CSN: [id number] Arrival date & time: 06/24/16  1357     History   Chief Complaint Chief Complaint  Patient presents with  . Foot Injury    HPI [name] [name] is a 66 y.o. female.   This is a 66 year old woman who presents to the urgent care center with an injury to her right lower extremity.  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 She's recently been evaluated for some atypical lymphocytes and was given a clean bill of health today. She also works with refugees. She twisted her ankle about one hour before arriving here. She applied ice but became too uncomfortable so she took it off.      Past Medical History:  Diagnosis Date  . Anemia    IN THE PAST WITH PREGNANCY  . Arthritis    LEFT FOOT; OCCAS LOWER BACK PAIN - HX OF VERTEBRAL FRACTURES  . Blood transfusion without reported diagnosis    DURING PREGNANCY  . Complication of anesthesia    a little sluggist when waking up-no intervention required  . Coronary artery spasm (HCC) 1990'S   AFTER OTC DIET PILLS - PT EXPERIENCED CHEST PAIN- PT HAD CARDIAC CATH - TOLD NO CAD - THOUGHT TO HAVE HAS CORNARY ARTERY SPASM  . H/O seasonal allergies   . Headache(784.0)    OCCAS SINUS HEADACHES  . Hearing impaired    LEFT EAR DEAFNESS  . Hypertension    220/104 IN SPRING 2015 - SAW HER DOCTOR - BUT B/P NORMALIZED - PT DECIDED TO STOP CAFFEINE AND HAS NOT HAD ANY ELEVATED PRESSURES SINCE JUNE  . Osteopenia   . Strabismus    HAD STRABISMUS SURGERY 1956 - BUT STILL HAS STRABISMUS  . Vitamin D deficiency     Patient Active Problem List   Diagnosis Date Noted  . Atypical lymphocytes present on peripheral blood smear 06/02/2016  . Family history of colon cancer-brother at age 25 05/27/2016  . Hematuria 11/25/2015  . Abdominal pain, right upper quadrant 11/25/2015  . UTI (urinary tract infection), uncomplicated 93/23/5573  . Well woman exam 12/18/2014  . Right knee pain  04/29/2014  . S/P laparoscopic sleeve gastrectomy 12/18/2013  . Persistent disorder of initiating or maintaining sleep 11/05/2013  . Obesity, Class III, BMI 40-49.9 (morbid obesity) ([place]) 09/12/2013  . Encounter for routine gynecological examination 08/14/2013  . CORONARY ARTERY SPASM 10/17/2009  . OSTEOPENIA 10/17/2009    Past Surgical History:  Procedure Laterality Date  . ABDOMINAL HYSTERECTOMY    . APPENDECTOMY    . BREATH TEK H PYLORI N/A 10/08/2013   Procedure: BREATH TEK H PYLORI;  Surgeon: Gayland Curry, MD;  Location: [facility];  Service: General;  Laterality: N/A;  . BREATH TEK H PYLORI N/A 12/03/2013   Procedure: BREATH TEK Kandis Ban;  Surgeon: Gayland Curry, MD;  Location: [facility];  Service: General;  Laterality: N/A;  . CHOLECYSTECTOMY N/A 04/13/2016   Procedure: LAPAROSCOPIC CHOLECYSTECTOMY WITH ATTEMPTED INTRAOPERATIVE CHOLANGIOGRAM;  Surgeon: Greer Pickerel, MD;  Location: [facility];  Service: General;  Laterality: N/A;  . EYE SURGERY Bilateral 2017   cataract surgery with lens implant  . INNER EAR SURGERY Left 1965   RADICAL MASTOIDECTOMY  . LAPAROSCOPIC GASTRIC SLEEVE RESECTION N/A 12/18/2013   Procedure: LAPAROSCOPIC GASTRIC SLEEVE RESECTION;  Surgeon: Greer Pickerel, MD;  Location: [facility];  Service: General;  Laterality: N/A;  . [place]  OF THE RIGHT FOOT  . strabismus repair    . TONSILLECTOMY      OB History    No data available       Home Medications    Prior to Admission medications   Medication Sig Start Date End Date Taking? Authorizing Provider  Calcium Carb-Cholecalciferol (CALCIUM-VITAMIN D) 500-200 MG-UNIT tablet Take by mouth.   Yes Historical Provider, MD  calcium-vitamin D (OSCAL WITH D) 500-200 MG-UNIT per tablet Take 2 tablets by mouth daily.   Yes Historical Provider, MD  Cyanocobalamin (VITAMIN B 12 PO) Take by mouth daily.   Yes Historical Provider, MD  docusate sodium  (COLACE) 100 MG capsule Take 100 mg by mouth 3 (three) times daily as needed for mild constipation.    Yes Historical Provider, MD  fluticasone (FLONASE) 50 MCG/ACT nasal spray Place 2 sprays into both nostrils daily. Reported on 04/17/2015   Yes Historical Provider, MD  Ibuprofen-Diphenhydramine Cit (MOTRIN PM) 200-38 MG TABS Take by mouth at bedtime as needed.   Yes Historical Provider, MD  pantoprazole (PROTONIX) 20 MG tablet Take 1 tablet (20 mg total) by mouth daily before breakfast. 05/27/16  Yes Gatha Mayer, MD  Polyethyl Glycol-Propyl Glycol (SYSTANE OP) Place 1 drop into both eyes daily as needed (dry eyes).   Yes Historical Provider, MD  traMADol (ULTRAM) 50 MG tablet Take 1 tablet (50 mg total) by mouth every 6 (six) hours as needed. 06/24/16   Robyn Haber, MD    Family History Family History  Problem Relation Age of Onset  . Hypertension Mother   . Diabetes Mother   . Obesity Mother   . Heart disease Mother   . Alcohol abuse Father   . COPD Other   . Hyperlipidemia Other   . Colon cancer Brother 4  . Stomach cancer Neg Hx   . Rectal cancer Neg Hx   . Esophageal cancer Neg Hx   . Liver cancer Neg Hx     Social History Social History  Substance Use Topics  . Smoking status: Never Smoker  . Smokeless tobacco: Never Used  . Alcohol use No     Allergies   Morphine and related and Augmentin [amoxicillin-pot clavulanate]   Review of Systems Review of Systems  Musculoskeletal: Positive for gait problem and joint swelling.  All other systems reviewed and are negative.    Physical Exam Triage Vital Signs ED Triage Vitals  Enc Vitals Group     BP      Pulse      Resp      Temp      Temp src      SpO2      Weight      Height      Head Circumference      Peak Flow      Pain Score      Pain Loc      Pain Edu?      Excl. in [place]?    No data found.   Updated Vital Signs BP (!) 142/74 (BP Location: Right Arm)   Pulse 93   Temp 98.7 F (37.1 C)  (Oral)   Resp 18   SpO2 97%    Physical Exam  Constitutional: She is oriented to person, place, and time. She appears well-developed and well-nourished.  HENT:  Right Ear: External ear normal.  Left Ear: External ear normal.  Mouth/Throat: Oropharynx is clear and moist.  Eyes: Conjunctivae are normal. Pupils are equal, round, and  reactive to light.  Right esophoria  Neck: Normal range of motion. Neck supple.  Pulmonary/Chest: Effort normal.  Musculoskeletal: She exhibits tenderness. She exhibits no deformity.  Tender swollen right lateral malleolus without ecchymosis. Medial malleolus is nontender and there is no tenderness over the fifth metatarsal  Neurological: She is alert and oriented to person, place, and time.  Skin: Skin is warm and dry.  Nursing note and vitals reviewed.    UC Treatments / Results  Labs (all labs ordered are listed, but only abnormal results are displayed) Labs Reviewed - No data to display  EKG  EKG Interpretation None       Radiology Dg Ankle Complete Right  Result Date: 06/24/2016 CLINICAL DATA:  Twisted ankle stepping off curb. Unable bear weight. EXAM: RIGHT ANKLE - COMPLETE 3+ VIEW COMPARISON:  None. FINDINGS: PA nondisplaced fracture is present in the distal fibula. Extensive soft swelling is present over the lateral malleolus. Large joint effusion is present. No additional fractures are seen. A small plantar calcaneal spur is noted. Mild degenerative changes are not have a midfoot. IMPRESSION: 1. Nondisplaced fracture in the distal fibula. 2. Extensive soft tissue swelling over the lateral malleolus. 3. Moderate-sized joint effusion. Electronically Signed   By: San Morelle M.D.   On: 06/24/2016 14:59    Procedures Procedures (including critical care time)  Medications Ordered in UC Medications - No data to display   Initial Impression / Assessment and Plan / UC Course  I have reviewed the triage vital signs and the nursing  notes.  Pertinent labs & imaging results that were available during my care of the patient were reviewed by me and considered in my medical decision making (see chart for details).     Final Clinical Impressions(s) / UC Diagnoses   Final diagnoses:  Closed fracture of right ankle, initial encounter    New Prescriptions New Prescriptions   TRAMADOL (ULTRAM) 50 MG TABLET    Take 1 tablet (50 mg total) by mouth every 6 (six) hours as needed.     Robyn Haber, MD 06/24/16 [phone]

## 2016-06-24 NOTE — ED Triage Notes (Signed)
Pt here for right foot/ankle inj onset 52  Reports she inverted foot while going into a restaurant  Sx today include: swelling and pain when bearing wt  Brought back on wheelchair... A&O x4... NAD

## 2016-06-24 NOTE — Telephone Encounter (Signed)
Gave patient avs report for today's visit. No f/u requested at this time.

## 2016-06-28 ENCOUNTER — Encounter: Payer: Self-pay | Admitting: Podiatry

## 2016-06-28 ENCOUNTER — Ambulatory Visit (INDEPENDENT_AMBULATORY_CARE_PROVIDER_SITE_OTHER): Payer: 59 | Admitting: Podiatry

## 2016-06-28 DIAGNOSIS — S8264XA Nondisplaced fracture of lateral malleolus of right fibula, initial encounter for closed fracture: Secondary | ICD-10-CM

## 2016-06-28 LAB — FLOW CYTOMETRY

## 2016-06-28 NOTE — Progress Notes (Signed)
She presents today for chief complaint of trauma to the right foot dated 06/24/2016 she fell from a curb. She sought medical attention at the emergency department x-rays were taken demonstrating fracture of the distal fibula. She is currently wearing a boot unable to utilize crutches.  Objective: Pedal palpation of the distal fibula pulses are palpable ecchymosis and bruising noted from mid fibula distally settling in the lateral heel no pain on palpation of the calf high fibula or calcaneus. Radiographs reviewed from the computer today do demonstrate a distal avulsion of the fibula no higher fractures are identified. No fractures of the ankle or tarsus is noted.  Assessment: Distal fracture minimally displaced.  Plan: She'll continue use cam walker. I encouraged her to utilize nonweightbearing stance but she states that she could not do that with crutches because of the instability of her left foot. I also recommended for her to try a knee scooter but she states that she is too short. I encouraged her to stay off of this as much as possible and keep it elevated. I will follow-up with her in 6 weeks for another set of x-rays. If this has not improved consider surgical intervention.

## 2016-07-01 ENCOUNTER — Encounter: Payer: Self-pay | Admitting: Podiatry

## 2016-07-01 ENCOUNTER — Ambulatory Visit (INDEPENDENT_AMBULATORY_CARE_PROVIDER_SITE_OTHER): Payer: 59 | Admitting: Podiatry

## 2016-07-01 ENCOUNTER — Ambulatory Visit (INDEPENDENT_AMBULATORY_CARE_PROVIDER_SITE_OTHER): Payer: 59

## 2016-07-01 DIAGNOSIS — S8264XD Nondisplaced fracture of lateral malleolus of right fibula, subsequent encounter for closed fracture with routine healing: Secondary | ICD-10-CM | POA: Diagnosis not present

## 2016-07-01 NOTE — Progress Notes (Signed)
She presents today for follow-up of her fracture to the lateral malleolus of her right foot. She states really been hurting been doing too much on it but this really doesn't fit.  Objective: Vital signs are stable she is alert and oriented 3 much decrease in edema and no erythema cellulitis drainage or mild tenderness on palpation of the distal fibula. Radiographs taken today do not demonstrate worsening of her diastases at the distal fracture site.  Assessment: Fracture distal fibula.  Plan: Fitted her for new boot. Encouraged her to stay off of the foot as much as possible utilizing any scooter or crutches follow up with me in 1 month and reevaluate the other foot if necessary.

## 2016-07-02 ENCOUNTER — Encounter: Payer: Self-pay | Admitting: Podiatry

## 2016-07-02 ENCOUNTER — Encounter: Payer: Self-pay | Admitting: Family Medicine

## 2016-07-19 ENCOUNTER — Encounter: Payer: Self-pay | Admitting: Podiatry

## 2016-08-02 ENCOUNTER — Ambulatory Visit (AMBULATORY_SURGERY_CENTER): Payer: Self-pay | Admitting: *Deleted

## 2016-08-02 ENCOUNTER — Telehealth: Payer: Self-pay | Admitting: *Deleted

## 2016-08-02 VITALS — Ht <= 58 in | Wt 138.0 lb

## 2016-08-02 DIAGNOSIS — Z800 Family history of malignant neoplasm of digestive organs: Secondary | ICD-10-CM

## 2016-08-02 MED ORDER — NA SULFATE-K SULFATE-MG SULF 17.5-3.13-1.6 GM/177ML PO SOLN: 1.0000 | Freq: Once | ORAL | 0 refills | Status: AC

## 2016-08-02 NOTE — Progress Notes (Signed)
Patient denies any allergies to eggs or soy. Patient denies any problems with anesthesia/sedation. Patient denies any oxygen use at home and does not take any diet/weight loss medications. EMMI education assisgned to patient on colonoscopy, this was explained and instructions given to patient. Suprep given to pt. See phone note.

## 2016-08-02 NOTE — Telephone Encounter (Signed)
[name] [name] is for recall colonoscopy on 08/09/16. She did have an office visit with you on 05/27/16. She has chronic constipation and takes Dulcolax 2-3 times each week. She states she does not have a BM unless she takes this. She also states Miralax does not work for her. I did give her the Suprep instructions for her colonoscopy and instructed her to take the Dulcolax 2 days prior to exam. I was unsure about the 2 day prep with Miralax. Any further instructions for colon prep? Please advise. Thank you, Kashmere Daywalt pv

## 2016-08-03 ENCOUNTER — Encounter: Payer: Self-pay | Admitting: Internal Medicine

## 2016-08-03 NOTE — Telephone Encounter (Signed)
Please have her take 4 dulcolax in afternoon 2 days before exam - wait 1-2 hours and then take 4 doses of MiraLax in 60-90 minutes  Then do Suprep  Thanks!  CEG

## 2016-08-03 NOTE — Telephone Encounter (Signed)
925 am Tuesday LM that identifies pt by first and last name to call PV at [phone]. [name] [name] PV   Pt will do this additional prep Saturday 08-07-16

## 2016-08-03 NOTE — Telephone Encounter (Signed)
Discussed new instructions with patient as per DR Carlean Purl- pt verbalized understanding of these instructions- instructed to call with questions as needed.   Lelan Pons PV

## 2016-08-04 MED FILL — SUPREP BOWEL PREP KIT: 17.5-3.13-1 | 1 days supply | Qty: 354 | Fill #0

## 2016-08-09 ENCOUNTER — Ambulatory Visit: Payer: 59

## 2016-08-09 ENCOUNTER — Ambulatory Visit (AMBULATORY_SURGERY_CENTER): Payer: 59 | Admitting: Internal Medicine

## 2016-08-09 ENCOUNTER — Encounter: Payer: Self-pay | Admitting: Internal Medicine

## 2016-08-09 ENCOUNTER — Ambulatory Visit (INDEPENDENT_AMBULATORY_CARE_PROVIDER_SITE_OTHER): Payer: 59 | Admitting: Podiatry

## 2016-08-09 VITALS — BP 109/73 | HR 70 | Temp 98.2°F | Resp 12 | Ht <= 58 in | Wt 138.0 lb

## 2016-08-09 DIAGNOSIS — D120 Benign neoplasm of cecum: Secondary | ICD-10-CM | POA: Diagnosis not present

## 2016-08-09 DIAGNOSIS — I2510 Atherosclerotic heart disease of native coronary artery without angina pectoris: Secondary | ICD-10-CM | POA: Diagnosis not present

## 2016-08-09 DIAGNOSIS — D124 Benign neoplasm of descending colon: Secondary | ICD-10-CM

## 2016-08-09 DIAGNOSIS — Z800 Family history of malignant neoplasm of digestive organs: Secondary | ICD-10-CM

## 2016-08-09 DIAGNOSIS — S8264XD Nondisplaced fracture of lateral malleolus of right fibula, subsequent encounter for closed fracture with routine healing: Secondary | ICD-10-CM

## 2016-08-09 DIAGNOSIS — Z1212 Encounter for screening for malignant neoplasm of rectum: Secondary | ICD-10-CM

## 2016-08-09 DIAGNOSIS — D125 Benign neoplasm of sigmoid colon: Secondary | ICD-10-CM | POA: Diagnosis not present

## 2016-08-09 DIAGNOSIS — Z1211 Encounter for screening for malignant neoplasm of colon: Secondary | ICD-10-CM

## 2016-08-09 DIAGNOSIS — Z86010 Personal history of colonic polyps: Secondary | ICD-10-CM | POA: Diagnosis not present

## 2016-08-09 MED ORDER — SODIUM CHLORIDE 0.9 % IV SOLN: 500.0000 mL | INTRAVENOUS | Status: DC

## 2016-08-09 NOTE — Op Note (Signed)
[facility] Patient Name: [name] [name] Procedure Date: 08/09/2016 9:18 AM MRN: [id number] Endoscopist: Gatha Mayer , MD Age: 66 Referring MD:  Date of Birth: [birth year] Gender: Female Account #: [id number] Procedure:                Colonoscopy Indications:              Screening patient at increased risk: Colorectal                            cancer in multiple 1st-degree relatives age 48                            years or older Medicines:                Propofol per Anesthesia, Monitored Anesthesia Care Procedure:                Pre-Anesthesia Assessment:                           - Prior to the procedure, a History and Physical                            was performed, and patient medications and                            allergies were reviewed. The patient's tolerance of                            previous anesthesia was also reviewed. The risks                            and benefits of the procedure and the sedation                            options and risks were discussed with the patient.                            All questions were answered, and informed consent                            was obtained. Prior Anticoagulants: The patient has                            taken no previous anticoagulant or antiplatelet                            agents. ASA Grade Assessment: II - A patient with                            mild systemic disease. After reviewing the risks                            and benefits, the patient was deemed in  satisfactory condition to undergo the procedure.                           After obtaining informed consent, the colonoscope                            was passed under direct vision. Throughout the                            procedure, the patient's blood pressure, pulse, and                            oxygen saturations were monitored continuously. The                            Colonoscope was  introduced through the anus and                            advanced to the the cecum, identified by                            appendiceal orifice and ileocecal valve. The                            colonoscopy was performed without difficulty. The                            patient tolerated the procedure well. The quality                            of the bowel preparation was good. The bowel                            preparation used was with extra dose 2 days before                            colonoscopy. The ileocecal valve, appendiceal                            orifice, and rectum were photographed. Scope In: 9:36:15 AM Scope Out: 9:54:26 AM Scope Withdrawal Time: 0 hours 13 minutes 57 seconds  Total Procedure Duration: 0 hours 18 minutes 11 seconds  Findings:                 The perianal and digital rectal examinations were                            normal.                           Three sessile polyps were found in the sigmoid                            colon, descending colon and cecum. The polyps were  diminutive in size. These polyps were removed with                            a cold snare. Resection and retrieval were                            complete. Verification of patient identification                            for the specimen was done. Estimated blood loss was                            minimal.                           The exam was otherwise without abnormality on                            direct and retroflexion views. Complications:            No immediate complications. Estimated Blood Loss:     Estimated blood loss was minimal. Impression:               - Three diminutive polyps in the sigmoid colon, in                            the descending colon and in the cecum, removed with                            a cold snare. Resected and retrieved.                           - The examination was otherwise normal on direct                             and retroflexion views.                           - Family hx colon cancer dx brother at 27 Recommendation:           - Patient has a contact number available for                            emergencies. The signs and symptoms of potential                            delayed complications were discussed with the                            patient. Return to normal activities tomorrow.                            Written discharge instructions were provided to the  patient.                           - Resume previous diet.                           - Continue present medications.                           - Repeat colonoscopy is recommended. The                            colonoscopy date will be determined after pathology                            results from today's exam become available for                            review. Gatha Mayer, MD 08/09/2016 10:01:40 AM This report has been signed electronically.

## 2016-08-09 NOTE — Progress Notes (Signed)
She presents today for a follow-up of her nondisplaced non-comminuted fracture of her distal fibula. She states is doing really well. She continues to wear her Cam [place]ford Rile most of the time that she states that she has been cheating some at home. She states it feels much better and she is ready to be without the boot. It of initial injury early March 2018.  Vital signs are stable she is alert and oriented 3 much decrease in edema to the right lower extremity just a small amount of edema distal and anterior to the distal fibula. Radiographs taken today demonstrate that the fibula appears to be healing naturally normally.  Assessment: Well-healing fracture distal fibula.  Plan: I encouraged her to wear the boot over difficult terrain other than inside of the house for the next month she understands this is amenable to it will follow up with me as needed.

## 2016-08-09 NOTE — Progress Notes (Signed)
Pt's states no medical or surgical changes since previsit or office visit. maw 

## 2016-08-09 NOTE — Progress Notes (Signed)
Report given to PACU, vss 

## 2016-08-09 NOTE — Progress Notes (Signed)
Called to room to assist during endoscopic procedure.  Patient ID and intended procedure confirmed with present staff. Received instructions for my participation in the procedure from the performing physician.  

## 2016-08-09 NOTE — Patient Instructions (Addendum)
I found and removed 3 small polyps - all look benign. Between that and the family history will not be able to wait 10 years - I will let you know pathology results and when to have another routine colonoscopy by mail and/or My Chart.  I appreciate the opportunity to care for you. Gatha Mayer, MD, FACG    YOU HAD AN ENDOSCOPIC PROCEDURE TODAY AT [facility]:   Refer to the procedure report that was given to you for any specific questions about what was found during the examination.  If the procedure report does not answer your questions, please call your gastroenterologist to clarify.  If you requested that your care partner not be given the details of your procedure findings, then the procedure report has been included in a sealed envelope for you to review at your convenience later.  YOU SHOULD EXPECT: Some feelings of bloating in the abdomen. Passage of more gas than usual.  Walking can help get rid of the air that was put into your GI tract during the procedure and reduce the bloating. If you had a lower endoscopy (such as a colonoscopy or flexible sigmoidoscopy) you may notice spotting of blood in your stool or on the toilet paper. If you underwent a bowel prep for your procedure, you may not have a normal bowel movement for a few days.  Please Note:  You might notice some irritation and congestion in your nose or some drainage.  This is from the oxygen used during your procedure.  There is no need for concern and it should clear up in a day or so.  SYMPTOMS TO REPORT IMMEDIATELY:   Following lower endoscopy (colonoscopy or flexible sigmoidoscopy):  Excessive amounts of blood in the stool  Significant tenderness or worsening of abdominal pains  Swelling of the abdomen that is new, acute  Fever of 100F or higher    For urgent or emergent issues, a gastroenterologist can be reached at any hour by calling [phone].   DIET:  We do recommend a small  meal at first, but then you may proceed to your regular diet.  Drink plenty of fluids but you should avoid alcoholic beverages for 24 hours.  ACTIVITY:  You should plan to take it easy for the rest of today and you should NOT DRIVE or use heavy machinery until tomorrow (because of the sedation medicines used during the test).    FOLLOW UP: Our staff will call the number listed on your records the next business day following your procedure to check on you and address any questions or concerns that you may have regarding the information given to you following your procedure. If we do not reach you, we will leave a message.  However, if you are feeling well and you are not experiencing any problems, there is no need to return our call.  We will assume that you have returned to your regular daily activities without incident.  If any biopsies were taken you will be contacted by phone or by letter within the next 1-3 weeks.  Please call us at [phone] if you have not heard about the biopsies in 3 weeks.    SIGNATURES/CONFIDENTIALITY: You and/or your care partner have signed paperwork which will be entered into your electronic medical record.  These signatures attest to the fact that that the information above on your After Visit Summary has been reviewed and is understood.  Full responsibility of the confidentiality of this  discharge information lies with you and/or your care-partner.   Resume medications. Information given on polyps.

## 2016-08-10 ENCOUNTER — Telehealth: Payer: Self-pay | Admitting: *Deleted

## 2016-08-10 NOTE — Telephone Encounter (Signed)
  Follow up Call-  Call back number 08/09/2016  Post procedure Call Back phone  # [phone]  Permission to leave phone message Yes  Some recent data might be hidden     Patient questions:  Do you have a fever, pain , or abdominal swelling? No. Pain Score  0 *  Have you tolerated food without any problems? Yes.    Have you been able to return to your normal activities? Yes.    Do you have any questions about your discharge instructions: Diet   No. Medications  No. Follow up visit  No.  Do you have questions or concerns about your Care? No.  Actions: * If pain score is 4 or above: No action needed, pain <4.

## 2016-08-11 ENCOUNTER — Encounter: Payer: Self-pay | Admitting: Internal Medicine

## 2016-08-14 ENCOUNTER — Encounter: Payer: Self-pay | Admitting: Internal Medicine

## 2016-08-14 NOTE — Progress Notes (Signed)
3 subcm adenomas recall 2021 My Chart letter

## 2016-09-03 NOTE — Addendum Note (Signed)
Addendum  created 09/03/16 9234 by Rica Koyanagi, MD   Sign clinical note

## 2016-09-19 ENCOUNTER — Telehealth: Payer: Self-pay | Admitting: Oncology

## 2016-09-19 NOTE — Telephone Encounter (Signed)
Cld the pt to schedule a follow up appt. Pt stated that Dr. Thana Farr has discharged her. She will be followed by her PCP. I voiced understanding.

## 2016-09-30 ENCOUNTER — Encounter: Payer: Self-pay | Admitting: Family Medicine

## 2016-09-30 ENCOUNTER — Ambulatory Visit (INDEPENDENT_AMBULATORY_CARE_PROVIDER_SITE_OTHER): Payer: 59 | Admitting: Family Medicine

## 2016-09-30 VITALS — BP 120/80 | HR 87 | Temp 98.0°F | Ht <= 58 in | Wt 141.0 lb

## 2016-09-30 DIAGNOSIS — M79675 Pain in left toe(s): Secondary | ICD-10-CM | POA: Diagnosis not present

## 2016-09-30 DIAGNOSIS — D7289 Other specified disorders of white blood cells: Secondary | ICD-10-CM

## 2016-09-30 DIAGNOSIS — R350 Frequency of micturition: Secondary | ICD-10-CM | POA: Diagnosis not present

## 2016-09-30 DIAGNOSIS — R888 Abnormal findings in other body fluids and substances: Secondary | ICD-10-CM | POA: Diagnosis not present

## 2016-09-30 LAB — POC URINALSYSI DIPSTICK (AUTOMATED)
Bilirubin, UA: NEGATIVE
Glucose, UA: NEGATIVE
KETONES UA: NEGATIVE
Leukocytes, UA: NEGATIVE
Nitrite, UA: NEGATIVE
PH UA: 6 (ref 5.0–8.0)
PROTEIN UA: NEGATIVE
RBC UA: NEGATIVE
SPEC GRAV UA: 1.01 (ref 1.010–1.025)
UROBILINOGEN UA: 0.2 U/dL

## 2016-09-30 LAB — CBC WITH DIFFERENTIAL/PLATELET
BASOS PCT: 0.4 % (ref 0.0–3.0)
Basophils Absolute: 0 10*3/uL (ref 0.0–0.1)
EOS PCT: 0.9 % (ref 0.0–5.0)
Eosinophils Absolute: 0.1 10*3/uL (ref 0.0–0.7)
HCT: 40.4 % (ref 36.0–46.0)
HEMOGLOBIN: 13.6 g/dL (ref 12.0–15.0)
LYMPHS ABS: 4.5 10*3/uL — AB (ref 0.7–4.0)
Lymphocytes Relative: 51.3 % — ABNORMAL HIGH (ref 12.0–46.0)
MCHC: 33.7 g/dL (ref 30.0–36.0)
MCV: 96.6 fl (ref 78.0–100.0)
MONO ABS: 0.8 10*3/uL (ref 0.1–1.0)
Monocytes Relative: 8.9 % (ref 3.0–12.0)
NEUTROS ABS: 3.4 10*3/uL (ref 1.4–7.7)
NEUTROS PCT: 38.5 % — AB (ref 43.0–77.0)
PLATELETS: 318 10*3/uL (ref 150.0–400.0)
RBC: 4.18 Mil/uL (ref 3.87–5.11)
RDW: 13.5 % (ref 11.5–15.5)
WBC: 8.7 10*3/uL (ref 4.0–10.5)

## 2016-09-30 LAB — URIC ACID: Uric Acid, Serum: 4.3 mg/dL (ref 2.4–7.0)

## 2016-09-30 NOTE — Assessment & Plan Note (Signed)
Neg UA. Reassurance provided.  Frequency may be due to bladder irritants... Drink water, avoid alcohol, caffeine, soda, citris, tomato, spicy foods. Call or return to clinic prn if these symptoms worsen or fail to improve as anticipated. The patient indicates understanding of these issues and agrees with the plan.

## 2016-09-30 NOTE — Assessment & Plan Note (Signed)
Currently asymptomatic. Will check uric acid today. Could certainly be OA as well.

## 2016-09-30 NOTE — Progress Notes (Signed)
Pre visit review using our clinic review tool, if applicable. No additional management support is needed unless otherwise documented below in the visit note. 

## 2016-09-30 NOTE — Addendum Note (Signed)
Addended by: Mady Haagensen on: 09/30/2016 12:31 PM   Modules accepted: Orders

## 2016-09-30 NOTE — Assessment & Plan Note (Signed)
Probable benign etiology. Repeat CBC, LDH today. The patient indicates understanding of these issues and agrees with the plan.

## 2016-09-30 NOTE — Progress Notes (Signed)
SUBJECTIVE: [name] [name] is a 66 y.o. female who complains of urinary frequency  x 3 weeks without dysuria, flank pain, fever, chills, or abnormal vaginal discharge or bleeding.   Also concerned that she may be having gout flares in her left great toe. Intermittent swelling and pain in her left great toe. No h/o gout.  Also needs repeat of CBC.  Was seen by hematologist who felt abnormalities likely benign but wanted CBC and LDH to be repeated in 3 months which would be now.   Current Outpatient Prescriptions on File Prior to Visit  Medication Sig Dispense Refill  . bisacodyl (DULCOLAX) 5 MG EC tablet Take 5 mg by mouth 3 (three) times a week.    . Calcium Carb-Cholecalciferol (CALCIUM-VITAMIN D) 500-200 MG-UNIT tablet Take by mouth.    . calcium-vitamin D (OSCAL WITH D) 500-200 MG-UNIT per tablet Take 2 tablets by mouth daily.    . Cyanocobalamin (VITAMIN B 12 PO) Take by mouth daily.    . fluticasone (FLONASE) 50 MCG/ACT nasal spray Place 2 sprays into both nostrils daily. Reported on 04/17/2015    . Ibuprofen-Diphenhydramine Cit (MOTRIN PM) 200-38 MG TABS Take by mouth at bedtime as needed.    Vladimir Faster Glycol-Propyl Glycol (SYSTANE OP) Place 1 drop into both eyes daily as needed (dry eyes).     No current facility-administered medications on file prior to visit.     Allergies  Allergen Reactions  . Augmentin [Amoxicillin-Pot Clavulanate] Diarrhea and Nausea Only  . Morphine And Related Other (See Comments)    DIZZINESS HYPOTENSION HYPOXIA    Past Medical History:  Diagnosis Date  . Allergy   . Anemia    IN THE PAST WITH PREGNANCY  . Arthritis    LEFT FOOT; OCCAS LOWER BACK PAIN - HX OF VERTEBRAL FRACTURES  . Blood transfusion without reported diagnosis    DURING PREGNANCY  . Complication of anesthesia    a little sluggist when waking up-no intervention required  . Coronary artery spasm (HCC) 1990'S   AFTER OTC DIET PILLS - PT EXPERIENCED CHEST PAIN- PT HAD CARDIAC  CATH - TOLD NO CAD - THOUGHT TO HAVE HAS CORNARY ARTERY SPASM  . H/O seasonal allergies   . Headache(784.0)    OCCAS SINUS HEADACHES  . Hearing impaired    LEFT EAR DEAFNESS  . Hypertension    220/104 IN SPRING 2015 - SAW HER DOCTOR - BUT B/P NORMALIZED - PT DECIDED TO STOP CAFFEINE AND HAS NOT HAD ANY ELEVATED PRESSURES SINCE JUNE  . Osteopenia   . Strabismus    HAD STRABISMUS SURGERY 1956 - BUT STILL HAS STRABISMUS  . Vitamin D deficiency     Past Surgical History:  Procedure Laterality Date  . ABDOMINAL HYSTERECTOMY    . APPENDECTOMY    . BREATH TEK H PYLORI N/A 10/08/2013   Procedure: BREATH TEK H PYLORI;  Surgeon: Gayland Curry, MD;  Location: [facility];  Service: General;  Laterality: N/A;  . BREATH TEK H PYLORI N/A 12/03/2013   Procedure: BREATH TEK Kandis Ban;  Surgeon: Gayland Curry, MD;  Location: [facility];  Service: General;  Laterality: N/A;  . CHOLECYSTECTOMY N/A 04/13/2016   Procedure: LAPAROSCOPIC CHOLECYSTECTOMY WITH ATTEMPTED INTRAOPERATIVE CHOLANGIOGRAM;  Surgeon: Greer Pickerel, MD;  Location: [facility];  Service: General;  Laterality: N/A;  . COLONOSCOPY    . EYE SURGERY Bilateral 2017   cataract surgery with lens implant  . INNER EAR SURGERY Left 1965   RADICAL MASTOIDECTOMY  .  LAPAROSCOPIC GASTRIC SLEEVE RESECTION N/A 12/18/2013   Procedure: LAPAROSCOPIC GASTRIC SLEEVE RESECTION;  Surgeon: Greer Pickerel, MD;  Location: [facility];  Service: General;  Laterality: N/A;  . REPAIR TENDONS FOOT Right 1994   AND RECONSTRUCTIVE SURGERY FOR MULTIPLE FRACTURES OF THE RIGHT FOOT  . STOMACH SURGERY    . strabismus repair    . TONSILLECTOMY      Family History  Problem Relation Age of Onset  . Hypertension Mother   . Diabetes Mother   . Obesity Mother   . Heart disease Mother   . Alcohol abuse Father   . COPD Other   . Hyperlipidemia Other   . Colon cancer Brother 62  . Stomach cancer Neg Hx   . Rectal cancer Neg Hx   . Esophageal cancer Neg Hx   . Pancreatic cancer Neg  Hx     Social History   Social History  . Marital status: Married    Spouse name: N/A  . Number of children: 2  . Years of education: N/A   Occupational History  . RN [employer]   Social History Main Topics  . Smoking status: Never Smoker  . Smokeless tobacco: Never Used  . Alcohol use No  . Drug use: No  . Sexual activity: Not on file   Other Topics Concern  . 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   Married with 2 offspring   The PMH, PSH, Social History, Family History, Medications, and allergies have been reviewed in [facility], and have been updated if relevant.  Review of Systems  Constitutional: Negative.   Genitourinary: Positive for frequency. Negative for decreased urine volume, difficulty urinating, dyspareunia, dysuria, enuresis, flank pain, genital sores, hematuria, menstrual problem, pelvic pain, urgency, vaginal bleeding, vaginal discharge and vaginal pain.  Musculoskeletal: Positive for arthralgias.  Neurological: Negative.   All other systems reviewed and are negative.   OBJECTIVE:   BP 120/80   Pulse 87   Temp 98 F (36.7 C)   Ht 4\' 10"  (1.473 m)   Wt 141 lb (64 kg)   SpO2 99%   BMI 29.47 kg/m    General:  Well-developed,well-nourished,in no acute distress; alert,appropriate and cooperative throughout examination Head:  normocephalic and atraumatic.   Eyes:  vision grossly intact, PERRL Ears:  R ear normal and L ear normal externally, Nose:  no external deformity.   Mouth:  good dentition.   Neck:  No deformities, masses, or tenderness noted. Breasts:  No mass, nodules, thickening, tenderness, bulging, retraction, inflamation, nipple discharge or skin changes noted.   Lungs:  Normal respiratory effort, chest expands symmetrically. Lungs are clear to auscultation, no crackles or wheezes. Heart:  Normal rate and regular rhythm. S1 and S2  normal without gallop, murmur, click, rub or other extra sounds. Abdomen:  Bowel sounds positive,abdomen soft and non-tender without masses, organomegaly or hernias noted. No CVA tenderness or suprapubic tenderness Msk:  No deformity or scoliosis noted of thoracic or lumbar spine. No redness or swelling noted of left great toe.   Extremities:  No clubbing, cyanosis, edema, or deformity noted with normal full range of motion of all joints.   Neurologic:  alert & oriented X3 and gait normal.   Skin:  Intact without suspicious lesions or rashes Psych:  Cognition and judgment appear intact. Alert and cooperative with normal attention span and concentration. No apparent delusions, illusions,  hallucinations

## 2016-10-01 LAB — LACTATE DEHYDROGENASE: LDH: 152 U/L (ref 120–250)

## 2016-10-06 ENCOUNTER — Encounter: Payer: Self-pay | Admitting: Family Medicine

## 2016-10-07 ENCOUNTER — Encounter: Payer: Self-pay | Admitting: Family Medicine

## 2016-10-11 ENCOUNTER — Encounter: Payer: Self-pay | Admitting: Family Medicine

## 2016-10-12 ENCOUNTER — Other Ambulatory Visit: Payer: Self-pay | Admitting: Family Medicine

## 2016-10-12 DIAGNOSIS — R319 Hematuria, unspecified: Secondary | ICD-10-CM

## 2016-10-13 ENCOUNTER — Encounter: Payer: Self-pay | Admitting: Family Medicine

## 2016-10-15 ENCOUNTER — Emergency Department (HOSPITAL_COMMUNITY): Payer: 59

## 2016-10-15 ENCOUNTER — Encounter (HOSPITAL_COMMUNITY): Payer: Self-pay | Admitting: Emergency Medicine

## 2016-10-15 ENCOUNTER — Emergency Department (HOSPITAL_COMMUNITY)
Admission: EM | Admit: 2016-10-15 | Discharge: 2016-10-16 | Disposition: A | Discharge status: Home / Self Care | Payer: 59 | Attending: Emergency Medicine | Admitting: Emergency Medicine

## 2016-10-15 DIAGNOSIS — Z87442 Personal history of urinary calculi: Secondary | ICD-10-CM | POA: Insufficient documentation

## 2016-10-15 DIAGNOSIS — R1032 Left lower quadrant pain: Secondary | ICD-10-CM | POA: Diagnosis not present

## 2016-10-15 DIAGNOSIS — R110 Nausea: Secondary | ICD-10-CM | POA: Insufficient documentation

## 2016-10-15 DIAGNOSIS — R1012 Left upper quadrant pain: Secondary | ICD-10-CM | POA: Diagnosis not present

## 2016-10-15 DIAGNOSIS — R509 Fever, unspecified: Secondary | ICD-10-CM | POA: Insufficient documentation

## 2016-10-15 DIAGNOSIS — R109 Unspecified abdominal pain: Secondary | ICD-10-CM | POA: Diagnosis not present

## 2016-10-15 DIAGNOSIS — R1013 Epigastric pain: Secondary | ICD-10-CM | POA: Diagnosis not present

## 2016-10-15 LAB — URINALYSIS, ROUTINE W REFLEX MICROSCOPIC
BILIRUBIN URINE: NEGATIVE
GLUCOSE, UA: NEGATIVE mg/dL
HGB URINE DIPSTICK: NEGATIVE
KETONES UR: NEGATIVE mg/dL
Nitrite: NEGATIVE
PH: 6 (ref 5.0–8.0)
PROTEIN: NEGATIVE mg/dL
Specific Gravity, Urine: 1.005 (ref 1.005–1.030)

## 2016-10-15 LAB — CBC
HCT: 37.5 % (ref 36.0–46.0)
Hemoglobin: 12.9 g/dL (ref 12.0–15.0)
MCH: 32.6 pg (ref 26.0–34.0)
MCHC: 34.4 g/dL (ref 30.0–36.0)
MCV: 94.7 fL (ref 78.0–100.0)
PLATELETS: 305 10*3/uL (ref 150–400)
RBC: 3.96 MIL/uL (ref 3.87–5.11)
RDW: 13 % (ref 11.5–15.5)
WBC: 8.8 10*3/uL (ref 4.0–10.5)

## 2016-10-15 LAB — COMPREHENSIVE METABOLIC PANEL
ALT: 19 U/L (ref 14–54)
AST: 24 U/L (ref 15–41)
Albumin: 4.6 g/dL (ref 3.5–5.0)
Alkaline Phosphatase: 76 U/L (ref 38–126)
Anion gap: 9 (ref 5–15)
BILIRUBIN TOTAL: 0.5 mg/dL (ref 0.3–1.2)
BUN: 25 mg/dL — AB (ref 6–20)
CHLORIDE: 105 mmol/L (ref 101–111)
CO2: 28 mmol/L (ref 22–32)
CREATININE: 0.58 mg/dL (ref 0.44–1.00)
Calcium: 9.4 mg/dL (ref 8.9–10.3)
Glucose, Bld: 101 mg/dL — ABNORMAL HIGH (ref 65–99)
Potassium: 4 mmol/L (ref 3.5–5.1)
Sodium: 142 mmol/L (ref 135–145)
TOTAL PROTEIN: 7.2 g/dL (ref 6.5–8.1)

## 2016-10-15 LAB — LIPASE, BLOOD: LIPASE: 59 U/L — AB (ref 11–51)

## 2016-10-15 NOTE — ED Triage Notes (Signed)
Pt c/o left side abdominal pain radiating around to left flank x 2 weeks, worse at night. Had hematuria on July 4, was treated with Bactrim. No gallbladder, had it removed last January.

## 2016-10-15 NOTE — ED Provider Notes (Signed)
[facility] DEPT Provider Note   CSN: [id number] Arrival date & time: 10/15/16  1709     History   Chief Complaint Chief Complaint  Patient presents with  . Abdominal Pain    HPI ARGIE LOBER is a 66 y.o. female.  Patient presents with pain in the LUQ and left flank pain for the past several days associated with nausea, chills, low grade fever. One week ago she noticed hematuria and was treated with Septra DS x 3 days. She continues to have the sense she does not empty her bladder when she urinates. She reports the pain is worse with sitting up and activity, better with lying back. She took oxycodone with some relief last night. She states she had a kidney stone about one year ago with similar symptoms.    The history is provided by the patient. No language interpreter was used.  Abdominal Pain   Associated symptoms include fever, nausea and hematuria. Pertinent negatives include vomiting.    Past Medical History:  Diagnosis Date  . Allergy   . Anemia    IN THE PAST WITH PREGNANCY  . Arthritis    LEFT FOOT; OCCAS LOWER BACK PAIN - HX OF VERTEBRAL FRACTURES  . Blood transfusion without reported diagnosis    DURING PREGNANCY  . Complication of anesthesia    a little sluggist when waking up-no intervention required  . Coronary artery spasm (HCC) 1990'S   AFTER OTC DIET PILLS - PT EXPERIENCED CHEST PAIN- PT HAD CARDIAC CATH - TOLD NO CAD - THOUGHT TO HAVE HAS CORNARY ARTERY SPASM  . H/O seasonal allergies   . Headache(784.0)    OCCAS SINUS HEADACHES  . Hearing impaired    LEFT EAR DEAFNESS  . Hypertension    220/104 IN SPRING 2015 - SAW HER DOCTOR - BUT B/P NORMALIZED - PT DECIDED TO STOP CAFFEINE AND HAS NOT HAD ANY ELEVATED PRESSURES SINCE JUNE  . Osteopenia   . Strabismus    HAD STRABISMUS SURGERY 1956 - BUT STILL HAS STRABISMUS  . Vitamin D deficiency     Patient Active Problem List   Diagnosis Date Noted  . Great toe pain, left 09/30/2016  . Urinary  frequency 09/30/2016  . Atypical lymphocytes present on peripheral blood smear 06/02/2016  . Family history of colon cancer-brother at age 62 05/27/2016  . Conductive hearing loss of right ear 05/04/2016  . S/P laparoscopic sleeve gastrectomy 12/18/2013  . Persistent disorder of initiating or maintaining sleep 11/05/2013  . Obesity, Class III, BMI 40-49.9 (morbid obesity) ([place]) 09/12/2013  . CORONARY ARTERY SPASM 10/17/2009  . OSTEOPENIA 10/17/2009    Past Surgical History:  Procedure Laterality Date  . ABDOMINAL HYSTERECTOMY    . APPENDECTOMY    . BREATH TEK H PYLORI N/A 10/08/2013   Procedure: BREATH TEK H PYLORI;  Surgeon: Gayland Curry, MD;  Location: [facility];  Service: General;  Laterality: N/A;  . BREATH TEK H PYLORI N/A 12/03/2013   Procedure: BREATH TEK Kandis Ban;  Surgeon: Gayland Curry, MD;  Location: [facility];  Service: General;  Laterality: N/A;  . CHOLECYSTECTOMY N/A 04/13/2016   Procedure: LAPAROSCOPIC CHOLECYSTECTOMY WITH ATTEMPTED INTRAOPERATIVE CHOLANGIOGRAM;  Surgeon: Greer Pickerel, MD;  Location: [facility];  Service: General;  Laterality: N/A;  . COLONOSCOPY    . EYE SURGERY Bilateral 2017   cataract surgery with lens implant  . INNER EAR SURGERY Left 1965   RADICAL MASTOIDECTOMY  . LAPAROSCOPIC GASTRIC SLEEVE RESECTION N/A 12/18/2013   Procedure: LAPAROSCOPIC  GASTRIC SLEEVE RESECTION;  Surgeon: Greer Pickerel, MD;  Location: [facility];  Service: General;  Laterality: N/A;  . REPAIR TENDONS FOOT Right 1994   AND RECONSTRUCTIVE SURGERY FOR MULTIPLE FRACTURES OF THE RIGHT FOOT  . STOMACH SURGERY    . strabismus repair    . TONSILLECTOMY      OB History    No data available       Home Medications    Prior to Admission medications   Medication Sig Start Date End Date Taking? Authorizing Provider  bisacodyl (DULCOLAX) 5 MG EC tablet Take 5 mg by mouth 3 (three) times a week.   Yes [provider]  Calcium Carb-Cholecalciferol (CALCIUM-VITAMIN D) 500-200  MG-UNIT tablet Take 1 tablet by mouth daily.    Yes [provider]  fluticasone (FLONASE) 50 MCG/ACT nasal spray Place 2 sprays into both nostrils daily as needed for allergies. Reported on 04/17/2015   Yes [provider]  Ibuprofen-Diphenhydramine Cit (MOTRIN PM) 200-38 MG TABS Take by mouth at bedtime as needed.   Yes [provider]  ketotifen (ALAWAY) 0.025 % ophthalmic solution Place 1 drop into both eyes 2 (two) times daily as needed (dry eyes).   Yes [provider]    Family History Family History  Problem Relation Age of Onset  . Hypertension Mother   . Diabetes Mother   . Obesity Mother   . Heart disease Mother   . Alcohol abuse Father   . COPD Other   . Hyperlipidemia Other   . Colon cancer Brother 31  . Stomach cancer Neg Hx   . Rectal cancer Neg Hx   . Esophageal cancer Neg Hx   . Pancreatic cancer Neg Hx     Social History Social History  Substance Use Topics  . Smoking status: Never Smoker  . Smokeless tobacco: Never Used  . Alcohol use No     Allergies   Augmentin [amoxicillin-pot clavulanate] and Morphine and related   Review of Systems Review of Systems  Constitutional: Positive for chills and fever.  Respiratory: Negative.   Cardiovascular: Negative.   Gastrointestinal: Positive for abdominal pain and nausea. Negative for vomiting.  Genitourinary: Positive for difficulty urinating, flank pain and hematuria.  Skin: Negative.   Neurological: Negative.      Physical Exam Updated Vital Signs BP (!) 145/88 (BP Location: Left Arm)   Pulse 85   Temp 97.7 F (36.5 C) (Oral)   Resp 18   SpO2 100%   Physical Exam  Constitutional: She is oriented to person, place, and time. She appears well-developed and well-nourished.  HENT:  Head: Normocephalic.  Neck: Normal range of motion. Neck supple.  Cardiovascular: Normal rate and regular rhythm.   Pulmonary/Chest: Effort normal and breath sounds normal. She has no  wheezes. She has no rales.  Abdominal: Soft. Bowel sounds are normal. There is tenderness. There is no rebound and no guarding.  Mildly tender in epigastric and LUQ abdomen without guarding or rebound.   Genitourinary:  Genitourinary Comments: Left flank tenderness.   Musculoskeletal: Normal range of motion.  Neurological: She is alert and oriented to person, place, and time.  Skin: Skin is warm and dry.  Psychiatric: She has a normal mood and affect.     ED Treatments / Results  Labs (all labs ordered are listed, but only abnormal results are displayed) Labs Reviewed  LIPASE, BLOOD - Abnormal; Notable for the following:       Result Value   Lipase 59 (*)  All other components within normal limits  COMPREHENSIVE METABOLIC PANEL - Abnormal; Notable for the following:    Glucose, Bld 101 (*)    BUN 25 (*)    All other components within normal limits  URINALYSIS, ROUTINE W REFLEX MICROSCOPIC - Abnormal; Notable for the following:    Color, Urine STRAW (*)    Leukocytes, UA SMALL (*)    Bacteria, UA RARE (*)    Squamous Epithelial / LPF 0-5 (*)    All other components within normal limits  CBC    EKG  EKG Interpretation None       Radiology No results found.  Procedures Procedures (including critical care time)  Medications Ordered in ED Medications - No data to display   Initial Impression / Assessment and Plan / ED Course  I have reviewed the triage vital signs and the nursing notes.  Pertinent labs & imaging results that were available during my care of the patient were reviewed by me and considered in my medical decision making (see chart for details).     Patient with LUQ and left flank pain x 1 week similar to previous diagnosis of kidney stone. No CT evidence stone tonight. Pain is improving. UA negative. No urinary retention.   Fosfomycin given after consideration of low grade temperature and recent UTI for possible pyelonephritis. Urine Culture  pending.   She is considered stable for discharge home. Return precautions provided.   Final Clinical Impressions(s) / ED Diagnoses   Final diagnoses:  None   1. LUQ abdominal pain 2. Left flank pain  New Prescriptions New Prescriptions   No medications on file     Dennie Bible 10/16/16 0128    Carmin Muskrat, MD 10/16/16 [phone]

## 2016-10-16 ENCOUNTER — Encounter: Payer: Self-pay | Admitting: Family Medicine

## 2016-10-16 DIAGNOSIS — Z87442 Personal history of urinary calculi: Secondary | ICD-10-CM | POA: Diagnosis not present

## 2016-10-16 DIAGNOSIS — R1032 Left lower quadrant pain: Secondary | ICD-10-CM | POA: Diagnosis not present

## 2016-10-16 DIAGNOSIS — R1012 Left upper quadrant pain: Secondary | ICD-10-CM | POA: Diagnosis not present

## 2016-10-16 DIAGNOSIS — R110 Nausea: Secondary | ICD-10-CM | POA: Diagnosis not present

## 2016-10-16 DIAGNOSIS — R509 Fever, unspecified: Secondary | ICD-10-CM | POA: Diagnosis not present

## 2016-10-16 MED ORDER — FOSFOMYCIN TROMETHAMINE 3 G PO PACK
3.0000 g | PACK | Freq: Once | ORAL | Status: AC
Start: 2016-10-16 — End: 2016-10-16
  Administered 2016-10-16: 3 g via ORAL
  Filled 2016-10-16: qty 3

## 2016-10-16 MED ORDER — OXYCODONE-ACETAMINOPHEN 5-325 MG PO TABS: 1.0000 | ORAL_TABLET | Freq: Four times a day (QID) | ORAL | 0 refills | Status: DC | PRN

## 2016-10-16 MED ORDER — PHENAZOPYRIDINE HCL 200 MG PO TABS: 200.0000 mg | ORAL_TABLET | Freq: Three times a day (TID) | ORAL | 0 refills | Status: DC

## 2016-10-16 MED ORDER — ONDANSETRON HCL 4 MG PO TABS: 4.0000 mg | ORAL_TABLET | Freq: Four times a day (QID) | ORAL | 0 refills | Status: DC

## 2016-10-16 NOTE — ED Notes (Signed)
Post void residual: 73 mL.

## 2016-10-17 LAB — URINE CULTURE

## 2016-10-18 ENCOUNTER — Ambulatory Visit (INDEPENDENT_AMBULATORY_CARE_PROVIDER_SITE_OTHER): Payer: 59 | Admitting: Family Medicine

## 2016-10-18 DIAGNOSIS — R109 Unspecified abdominal pain: Secondary | ICD-10-CM

## 2016-10-18 NOTE — Progress Notes (Signed)
Subjective:   Patient ID: [name] [name], female    DOB: [birth year], 66 y.o.   MRN: [id number]  [name] [name] is a pleasant 66 y.o. year old female who presents to clinic today with Hospitalization Follow-up  on 10/18/2016  HPI:  Was seen in the ER on 10/15/16 for abdominal pain.  Note reviewed.  LUQ/left flank pain for several days associated with nausea, chills and low grade fever.  She was treated for UTI with Bactrim DS x 3 days the week prior.  I had referred her to urology for hematuria and persistent pain, she has not had this appointment yet.  On exam in ER, she did have some left flank tenderness with palpation.  UA- pos LE with rare bacteria, urine Cx- multiple species present. Lipase 59  Renal stone CT neg- did show some constipation.  Given fosfomycin for ? Pyleo.  Feels better today.  Still having some flank pain.  Ct Renal Stone Study  Result Date: 10/15/2016 CLINICAL DATA:  Left flank pain for 2 weeks.  Pain worse tonight. EXAM: CT ABDOMEN AND PELVIS WITHOUT CONTRAST TECHNIQUE: Multidetector CT imaging of the abdomen and pelvis was performed following the standard protocol without IV contrast. COMPARISON:  CT 06/03/2016 FINDINGS: Lower chest: The lung bases are clear. Hepatobiliary: No focal liver abnormality is seen. Status post cholecystectomy. Stable biliary prominence of common bile duct measuring 8 mm porta hepatis. Pancreas: No ductal dilatation or inflammation. Spleen: Normal in size, scattered calcified granuloma. Adrenals/Urinary Tract: No adrenal nodule. No hydronephrosis or perinephric edema. No urolithiasis. Both ureters are decompressed without stones along the course. Small left kidney hypodensity on prior CT is not seen without contrast. Urinary bladder is distended. No bladder wall thickening. No bladder stone. Stomach/Bowel: Post sleeve gastrectomy. Stomach is physiologically distended. No abnormal gastric distension. No small bowel dilatation or  evidence of inflammation. Moderate colonic stool burden. No colonic wall thickening. Normal appendix is visualized, despite listed history of appendectomy in the electronic medical record. Vascular/Lymphatic: Abdominal aorta is normal in caliber. No abdominal or pelvic adenopathy. Reproductive: Status post hysterectomy. No adnexal masses. Other: No free air, free fluid, or intra-abdominal fluid collection. Musculoskeletal: There are no acute or suspicious osseous abnormalities. Chronic anterior wedging at T12. IMPRESSION: 1. No renal stones or obstructive uropathy. No acute abnormality in the abdomen/pelvis. 2. Moderate diffuse colonic stool burden, can be seen with constipation. Electronically Signed   By: Jeb Levering M.D.   On: 10/15/2016 23:52   Current Outpatient Prescriptions on File Prior to Visit  Medication Sig Dispense Refill  . bisacodyl (DULCOLAX) 5 MG EC tablet Take 5 mg by mouth 3 (three) times a week.    . Calcium Carb-Cholecalciferol (CALCIUM-VITAMIN D) 500-200 MG-UNIT tablet Take 1 tablet by mouth daily.     . fluticasone (FLONASE) 50 MCG/ACT nasal spray Place 2 sprays into both nostrils daily as needed for allergies. Reported on 04/17/2015    . Ibuprofen-Diphenhydramine Cit (MOTRIN PM) 200-38 MG TABS Take by mouth at bedtime as needed.    Marland Kitchen ketotifen (ALAWAY) 0.025 % ophthalmic solution Place 1 drop into both eyes 2 (two) times daily as needed (dry eyes).    . ondansetron (ZOFRAN) 4 MG tablet Take 1 tablet (4 mg total) by mouth every 6 (six) hours. (Patient not taking: Reported on 10/18/2016) 12 tablet 0  . oxyCODONE-acetaminophen (PERCOCET/ROXICET) 5-325 MG tablet Take 1 tablet by mouth every 6 (six) hours as needed for severe pain. (Patient not taking: Reported on 10/18/2016) 6  tablet 0  . phenazopyridine (PYRIDIUM) 200 MG tablet Take 1 tablet (200 mg total) by mouth 3 (three) times daily. (Patient not taking: Reported on 10/18/2016) 6 tablet 0   No current facility-administered  medications on file prior to visit.     Allergies  Allergen Reactions  . Augmentin [Amoxicillin-Pot Clavulanate] Diarrhea and Nausea Only  . Morphine And Related Other (See Comments)    DIZZINESS HYPOTENSION HYPOXIA    Past Medical History:  Diagnosis Date  . Allergy   . Anemia    IN THE PAST WITH PREGNANCY  . Arthritis    LEFT FOOT; OCCAS LOWER BACK PAIN - HX OF VERTEBRAL FRACTURES  . Blood transfusion without reported diagnosis    DURING PREGNANCY  . Complication of anesthesia    a little sluggist when waking up-no intervention required  . Coronary artery spasm (HCC) 1990'S   AFTER OTC DIET PILLS - PT EXPERIENCED CHEST PAIN- PT HAD CARDIAC CATH - TOLD NO CAD - THOUGHT TO HAVE HAS CORNARY ARTERY SPASM  . H/O seasonal allergies   . Headache(784.0)    OCCAS SINUS HEADACHES  . Hearing impaired    LEFT EAR DEAFNESS  . Hypertension    220/104 IN SPRING 2015 - SAW HER DOCTOR - BUT B/P NORMALIZED - PT DECIDED TO STOP CAFFEINE AND HAS NOT HAD ANY ELEVATED PRESSURES SINCE JUNE  . Osteopenia   . Strabismus    HAD STRABISMUS SURGERY 1956 - BUT STILL HAS STRABISMUS  . Vitamin D deficiency     Past Surgical History:  Procedure Laterality Date  . ABDOMINAL HYSTERECTOMY    . APPENDECTOMY    . BREATH TEK H PYLORI N/A 10/08/2013   Procedure: BREATH TEK H PYLORI;  Surgeon: Gayland Curry, MD;  Location: [facility];  Service: General;  Laterality: N/A;  . BREATH TEK H PYLORI N/A 12/03/2013   Procedure: BREATH TEK Kandis Ban;  Surgeon: Gayland Curry, MD;  Location: [facility];  Service: General;  Laterality: N/A;  . CHOLECYSTECTOMY N/A 04/13/2016   Procedure: LAPAROSCOPIC CHOLECYSTECTOMY WITH ATTEMPTED INTRAOPERATIVE CHOLANGIOGRAM;  Surgeon: Greer Pickerel, MD;  Location: [facility];  Service: General;  Laterality: N/A;  . COLONOSCOPY    . EYE SURGERY Bilateral 2017   cataract surgery with lens implant  . INNER EAR SURGERY Left 1965   RADICAL MASTOIDECTOMY  . LAPAROSCOPIC GASTRIC SLEEVE  RESECTION N/A 12/18/2013   Procedure: LAPAROSCOPIC GASTRIC SLEEVE RESECTION;  Surgeon: Greer Pickerel, MD;  Location: [facility];  Service: General;  Laterality: N/A;  . REPAIR TENDONS FOOT Right 1994   AND RECONSTRUCTIVE SURGERY FOR MULTIPLE FRACTURES OF THE RIGHT FOOT  . STOMACH SURGERY    . strabismus repair    . TONSILLECTOMY      Family History  Problem Relation Age of Onset  . Hypertension Mother   . Diabetes Mother   . Obesity Mother   . Heart disease Mother   . Alcohol abuse Father   . COPD Other   . Hyperlipidemia Other   . Colon cancer Brother 27  . Stomach cancer Neg Hx   . Rectal cancer Neg Hx   . Esophageal cancer Neg Hx   . Pancreatic cancer Neg Hx     Social History   Social History  . Marital status: Married    Spouse name: N/A  . Number of children: 2  . Years of education: N/A   Occupational History  . RN [employer]   Social History Main Topics  . Smoking status:  Never Smoker  . Smokeless tobacco: Never Used  . Alcohol use No  . Drug use: No  . Sexual activity: Not on file   Other Topics Concern  . 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   Married with 2 offspring   The PMH, PSH, Social History, Family History, Medications, and allergies have been reviewed in [facility], and have been updated if relevant.      Review of Systems  Constitutional: Negative.   HENT: Negative.   Eyes: Negative.   Cardiovascular: Negative.   Gastrointestinal: Positive for abdominal pain and constipation. Negative for abdominal distention.  Genitourinary: Negative.   Musculoskeletal: Positive for back pain.  Allergic/Immunologic: Negative.   Neurological: Negative.   Hematological: Negative.   Psychiatric/Behavioral: Negative.   All other systems reviewed and are negative.      Objective:    BP 102/80   Pulse 92   Ht 4\' 10"  (1.473 m)   Wt 138 lb (62.6 kg)    SpO2 99%   BMI 28.84 kg/m    Physical Exam    General:  Well-developed,well-nourished,in no acute distress; alert,appropriate and cooperative throughout examination Head:  normocephalic and atraumatic.   Eyes:  vision grossly intact, PERRL Ears:  R ear normal and L ear normal externally, TMs clear bilaterally Nose:  no external deformity.   Mouth:  good dentition.   Neck:  No deformities, masses, or tenderness noted Lungs:  Normal respiratory effort, chest expands symmetrically. Lungs are clear to auscultation, no crackles or wheezes. Heart:  Normal rate and regular rhythm. S1 and S2 normal without gallop, murmur, click, rub or other extra sounds. Abdomen:  Bowel sounds positive,abdomen soft and non-tender without masses, organomegaly or hernias noted. Msk:  No deformity or scoliosis noted of thoracic or lumbar spine.   + CVA tenderness Extremities:  No clubbing, cyanosis, edema, or deformity noted with normal full range of motion of all joints.   Neurologic:  alert & oriented X3 and gait normal.   Skin:  Intact without suspicious lesions or rashes Psych:  Cognition and judgment appear intact. Alert and cooperative with normal attention span and concentration. No apparent delusions, illusions, hallucinations       Assessment & Plan:   Abdominal pain, left lateral No Follow-up on file.

## 2016-10-18 NOTE — Assessment & Plan Note (Signed)
Likely multifactorial- constipation and ? If she passed a stone prior to the renal CT being done. Still want her to see urology for cystoscopy- appt scheduled. Covered for pylelo and some symptoms improving. Continue bowel regimen- she does appear to be improving overall. Call or return to clinic prn if these symptoms worsen or fail to improve as anticipated. The patient indicates understanding of these issues and agrees with the plan.

## 2016-10-21 ENCOUNTER — Encounter: Payer: Self-pay | Admitting: Family Medicine

## 2016-11-25 ENCOUNTER — Encounter: Payer: Self-pay | Admitting: Family Medicine

## 2016-12-26 ENCOUNTER — Encounter: Payer: Self-pay | Admitting: Family Medicine

## 2016-12-28 DIAGNOSIS — H6123 Impacted cerumen, bilateral: Secondary | ICD-10-CM | POA: Diagnosis not present

## 2017-02-15 ENCOUNTER — Ambulatory Visit (INDEPENDENT_AMBULATORY_CARE_PROVIDER_SITE_OTHER): Payer: 59 | Admitting: Podiatry

## 2017-02-15 ENCOUNTER — Encounter: Payer: Self-pay | Admitting: Podiatry

## 2017-02-15 ENCOUNTER — Ambulatory Visit (INDEPENDENT_AMBULATORY_CARE_PROVIDER_SITE_OTHER): Payer: 59

## 2017-02-15 DIAGNOSIS — S8264XD Nondisplaced fracture of lateral malleolus of right fibula, subsequent encounter for closed fracture with routine healing: Secondary | ICD-10-CM

## 2017-02-15 DIAGNOSIS — M205X1 Other deformities of toe(s) (acquired), right foot: Secondary | ICD-10-CM

## 2017-02-15 MED ORDER — MELOXICAM 15 MG PO TABS: 15.0000 mg | ORAL_TABLET | Freq: Every day | ORAL | 3 refills | Status: DC

## 2017-02-15 NOTE — Progress Notes (Signed)
She presents today for follow-up of pain to the first metatarsophalangeal joint. She states that this is doing a lot worse now and I'm concerned about it. States that her ankle fracture is gone on to heal uneventfully.  Objective: Vital signs are stable she is alert and oriented 3. Pulses are palpable. She has limitation on range of motion with pain on range of motion of the first metatarsophalangeal joint right foot. Radiographs of the right foot taken today demonstrate 3 views osseously mature individual with joint space narrowing subchondral sclerosis dorsal spurring and flattening of the base of the proximal phalanx.  Assessment: Osteoarthritis first metatarsophalangeal joint of the right foot.  Plan: Discussed etiology pathology conservative versus surgical therapy started her on meloxicam which she will take on a when necessary basis 15 mg a day. I also started her thinking about surgical intervention or injection therapy. She will follow up with me should the oral therapy not alleviate her symptoms.

## 2017-02-17 MED FILL — MELOXICAM 15 MG TABLET: 15 | 30 days supply | Qty: 30 | Fill #0

## 2017-03-07 DIAGNOSIS — H26491 Other secondary cataract, right eye: Secondary | ICD-10-CM | POA: Diagnosis not present

## 2017-03-22 DIAGNOSIS — H26491 Other secondary cataract, right eye: Secondary | ICD-10-CM | POA: Diagnosis not present

## 2017-06-07 ENCOUNTER — Other Ambulatory Visit: Payer: Self-pay | Admitting: Family Medicine

## 2017-06-07 DIAGNOSIS — Z139 Encounter for screening, unspecified: Secondary | ICD-10-CM

## 2017-06-28 ENCOUNTER — Ambulatory Visit
Admission: RE | Admit: 2017-06-28 | Discharge: 2017-06-28 | Disposition: A | Discharge status: Home / Self Care | Payer: Medicare Other | Source: Ambulatory Visit | Attending: Family Medicine | Admitting: Family Medicine

## 2017-06-28 DIAGNOSIS — Z139 Encounter for screening, unspecified: Secondary | ICD-10-CM

## 2017-07-20 ENCOUNTER — Encounter (HOSPITAL_COMMUNITY): Payer: Self-pay

## 2017-08-15 ENCOUNTER — Encounter: Payer: Self-pay | Admitting: Family Medicine

## 2017-08-15 ENCOUNTER — Telehealth: Payer: Medicare Other | Admitting: Family Medicine

## 2017-08-15 DIAGNOSIS — R319 Hematuria, unspecified: Secondary | ICD-10-CM

## 2017-08-15 DIAGNOSIS — N390 Urinary tract infection, site not specified: Secondary | ICD-10-CM

## 2017-08-15 MED ORDER — NITROFURANTOIN MONOHYD MACRO 100 MG PO CAPS: 100.0000 mg | ORAL_CAPSULE | Freq: Two times a day (BID) | ORAL | 0 refills | Status: AC

## 2017-08-15 MED FILL — NITROFURANTOIN MONO-MCR 100: 100 | 5 days supply | Qty: 10 | Fill #0

## 2017-08-15 NOTE — Progress Notes (Signed)

## 2017-08-16 ENCOUNTER — Encounter: Payer: Self-pay | Admitting: Family Medicine

## 2017-08-16 ENCOUNTER — Ambulatory Visit: Payer: Medicare Other | Admitting: Family Medicine

## 2017-08-16 VITALS — BP 112/72 | HR 75 | Temp 98.6°F | Ht <= 58 in | Wt 149.2 lb

## 2017-08-16 DIAGNOSIS — N3001 Acute cystitis with hematuria: Secondary | ICD-10-CM | POA: Diagnosis not present

## 2017-08-16 DIAGNOSIS — R300 Dysuria: Secondary | ICD-10-CM | POA: Diagnosis not present

## 2017-08-16 DIAGNOSIS — R319 Hematuria, unspecified: Secondary | ICD-10-CM | POA: Diagnosis not present

## 2017-08-16 DIAGNOSIS — N390 Urinary tract infection, site not specified: Secondary | ICD-10-CM | POA: Insufficient documentation

## 2017-08-16 LAB — POCT URINALYSIS DIPSTICK
Bilirubin, UA: NEGATIVE
Glucose, UA: NEGATIVE
Ketones, UA: NEGATIVE
LEUKOCYTES UA: NEGATIVE
Nitrite, UA: NEGATIVE
PH UA: 6 (ref 5.0–8.0)
Protein, UA: NEGATIVE
RBC UA: NEGATIVE
Spec Grav, UA: 1.01 (ref 1.010–1.025)
Urobilinogen, UA: 0.2 E.U./dL

## 2017-08-16 NOTE — Patient Instructions (Signed)
Great to see you.  Please drink a lot of fluids.  Keep taking your macrobid.  We will call you with your culture results.

## 2017-08-16 NOTE — Assessment & Plan Note (Signed)
UA neg- symptoms improving, macrobid seems to be working will send for culture. The patient indicates understanding of these issues and agrees with the plan.

## 2017-08-16 NOTE — Progress Notes (Signed)
Subjective:   Patient ID: [name] [name], female    DOB: 04-Dec-1950, 67 y.o.   MRN: [id number]  [name] [name] is a pleasant 67 y.o. year old female who presents to clinic today with Hematuria (Patient had an E-Visit on 5.13.19. On 5.12.19 urinary Sx started with urgency, frequency, dysuria, and gross hematuria.  She was Rx'ed Macrobid.  I advised that since Sx were not better it would be wise to be seen and for [place] to culture the U/A to ensure Tx is correct for organism present in urine.  She has had 3 doses of Macrobid and states that she is feeling some better.)  on 08/16/2017  HPI:  UTI- did an evist on 08/15/17 for dysuria urgency, gross hematuria. Prescribed macrobid. Symptoms are much better today, almost completely resolved.  Has already taken 3 doses of Macrobid.  Yesterday they were still present so she made this appointment to have UA and urine cx done.  Current Outpatient Medications on File Prior to Visit  Medication Sig Dispense Refill  . bisacodyl (DULCOLAX) 5 MG EC tablet Take 5 mg by mouth 3 (three) times a week.    . Calcium Carb-Cholecalciferol (CALCIUM-VITAMIN D) 500-200 MG-UNIT tablet Take 1 tablet by mouth daily.     . fluticasone (FLONASE) 50 MCG/ACT nasal spray Place 2 sprays into both nostrils daily as needed for allergies. Reported on 04/17/2015    . Ibuprofen-Diphenhydramine Cit (MOTRIN PM) 200-38 MG TABS Take by mouth at bedtime as needed.    Marland Kitchen ketotifen (ALAWAY) 0.025 % ophthalmic solution Place 1 drop into both eyes 2 (two) times daily as needed (dry eyes).    . meloxicam (MOBIC) 15 MG tablet Take 1 tablet (15 mg total) daily by mouth. 30 tablet 3  . nitrofurantoin, macrocrystal-monohydrate, (MACROBID) 100 MG capsule Take 1 capsule (100 mg total) by mouth 2 (two) times daily for 5 days. 10 capsule 0  . ondansetron (ZOFRAN) 4 MG tablet Take 1 tablet (4 mg total) by mouth every 6 (six) hours. 12 tablet 0   No current facility-administered medications on file  prior to visit.     Allergies  Allergen Reactions  . Augmentin [Amoxicillin-Pot Clavulanate] Diarrhea and Nausea Only  . Morphine And Related Other (See Comments)    DIZZINESS HYPOTENSION HYPOXIA    Past Medical History:  Diagnosis Date  . Allergy   . Anemia    IN THE PAST WITH PREGNANCY  . Arthritis    LEFT FOOT; OCCAS LOWER BACK PAIN - HX OF VERTEBRAL FRACTURES  . Blood transfusion without reported diagnosis    DURING PREGNANCY  . Complication of anesthesia    a little sluggist when waking up-no intervention required  . Coronary artery spasm (HCC) 1990'S   AFTER OTC DIET PILLS - PT EXPERIENCED CHEST PAIN- PT HAD CARDIAC CATH - TOLD NO CAD - THOUGHT TO HAVE HAS CORNARY ARTERY SPASM  . H/O seasonal allergies   . Headache(784.0)    OCCAS SINUS HEADACHES  . Hearing impaired    LEFT EAR DEAFNESS  . Hypertension    220/104 IN SPRING 2015 - SAW HER DOCTOR - BUT B/P NORMALIZED - PT DECIDED TO STOP CAFFEINE AND HAS NOT HAD ANY ELEVATED PRESSURES SINCE JUNE  . Osteopenia   . Strabismus    HAD STRABISMUS SURGERY 1956 - BUT STILL HAS STRABISMUS  . Vitamin D deficiency     Past Surgical History:  Procedure Laterality Date  . ABDOMINAL HYSTERECTOMY    . APPENDECTOMY    .  BREATH TEK H PYLORI N/A 10/08/2013   Procedure: BREATH TEK H PYLORI;  Surgeon: Gayland Curry, MD;  Location: [facility];  Service: General;  Laterality: N/A;  . BREATH TEK H PYLORI N/A 12/03/2013   Procedure: BREATH TEK Kandis Ban;  Surgeon: Gayland Curry, MD;  Location: [facility];  Service: General;  Laterality: N/A;  . CHOLECYSTECTOMY N/A 04/13/2016   Procedure: LAPAROSCOPIC CHOLECYSTECTOMY WITH ATTEMPTED INTRAOPERATIVE CHOLANGIOGRAM;  Surgeon: Greer Pickerel, MD;  Location: [facility];  Service: General;  Laterality: N/A;  . COLONOSCOPY    . EYE SURGERY Bilateral 2017   cataract surgery with lens implant  . INNER EAR SURGERY Left 1965   RADICAL MASTOIDECTOMY  . LAPAROSCOPIC GASTRIC SLEEVE RESECTION N/A 12/18/2013    Procedure: LAPAROSCOPIC GASTRIC SLEEVE RESECTION;  Surgeon: Greer Pickerel, MD;  Location: [facility];  Service: General;  Laterality: N/A;  . REPAIR TENDONS FOOT Right 1994   AND RECONSTRUCTIVE SURGERY FOR MULTIPLE FRACTURES OF THE RIGHT FOOT  . STOMACH SURGERY    . strabismus repair    . TONSILLECTOMY      Family History  Problem Relation Age of Onset  . Hypertension Mother   . Diabetes Mother   . Obesity Mother   . Heart disease Mother   . Alcohol abuse Father   . COPD Other   . Hyperlipidemia Other   . Colon cancer Brother 80  . Stomach cancer Neg Hx   . Rectal cancer Neg Hx   . Esophageal cancer Neg Hx   . Pancreatic cancer Neg Hx     Social History   Socioeconomic History  . Marital status: Married    Spouse name: Not on file  . Number of children: 2  . Years of education: Not on file  . Highest education level: Not on file  Occupational History  . Occupation: Programmer, multimedia: [employer]  . Financial resource strain: Not on file  . Food insecurity:    Worry: Not on file    Inability: Not on file  . Transportation needs:    Medical: Not on file    Non-medical: Not on file  Tobacco Use  . Smoking status: Never Smoker  . Smokeless tobacco: Never Used  Substance and Sexual Activity  . Alcohol use: No    Alcohol/week: 0.0 oz  . Drug use: No  . Sexual activity: Not on file  Lifestyle  . Physical activity:    Days per week: Not on file    Minutes per session: Not on file  . Stress: Not on file  Relationships  . Social connections:    Talks on phone: Not on file    Gets together: Not on file    Attends religious service: Not on file    Active member of club or organization: Not on file    Attends meetings of clubs or organizations: Not on file    Relationship status: Not on file  . Intimate partner violence:    Fear of current or ex partner: Not on file    Emotionally abused: Not on file    Physically abused: Not on file    Forced sexual  activity: Not on file  Other Topics Concern  . 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   Married with 2 offspring   The PMH, PSH, Social History, Family History, Medications, and  allergies have been reviewed in [facility], and have been updated if relevant.   Review of Systems  Constitutional: Negative.   Genitourinary: Negative.   Musculoskeletal: Negative.   Neurological: Negative.   All other systems reviewed and are negative.      Objective:    BP 112/72 (BP Location: Left Arm, Patient Position: Sitting, Cuff Size: Normal)   Pulse 75   Temp 98.6 F (37 C) (Oral)   Ht 4\' 10"  (1.473 m)   Wt 149 lb 3.2 oz (67.7 kg)   SpO2 99%   BMI 31.18 kg/m    Physical Exam  Constitutional: She is oriented to person, place, and time. She appears well-developed and well-nourished. No distress.  HENT:  Head: Normocephalic and atraumatic.  Eyes: EOM are normal.  Neck: Normal range of motion.  Cardiovascular: Normal rate.  Pulmonary/Chest: Effort normal.  Abdominal: Soft. She exhibits no distension and no mass. There is no tenderness. There is no guarding.  Musculoskeletal:  No CVA tenderness  Neurological: She is alert and oriented to person, place, and time. No cranial nerve deficit. Coordination normal.  Skin: Skin is warm. She is not diaphoretic.  Psychiatric: She has a normal mood and affect. Her behavior is normal. Judgment and thought content normal.  Nursing note and vitals reviewed.         Assessment & Plan:   Hematuria, unspecified type - Plan: POCT urinalysis dipstick, Urine Culture  Dysuria - Plan: POCT urinalysis dipstick, Urine Culture No follow-ups on file.

## 2017-08-17 LAB — URINE CULTURE
MICRO NUMBER: 90586168
Result:: NO GROWTH
SPECIMEN QUALITY:: ADEQUATE

## 2017-08-18 ENCOUNTER — Encounter: Payer: Self-pay | Admitting: Family Medicine

## 2017-08-23 ENCOUNTER — Encounter: Payer: Self-pay | Admitting: Family Medicine

## 2017-08-23 ENCOUNTER — Other Ambulatory Visit: Payer: Self-pay

## 2017-08-23 MED ORDER — NYSTATIN 100000 UNIT/GM EX POWD: Freq: Two times a day (BID) | CUTANEOUS | 1 refills | Status: DC

## 2017-08-23 NOTE — Progress Notes (Signed)
Per TA ok to send in Nystatin Powder/sent message to pt/thx dmf

## 2017-09-02 ENCOUNTER — Encounter: Payer: Self-pay | Admitting: Family Medicine

## 2017-10-13 ENCOUNTER — Other Ambulatory Visit (HOSPITAL_COMMUNITY)
Admission: RE | Admit: 2017-10-13 | Discharge: 2017-10-13 | Disposition: A | Discharge status: Home / Self Care | Payer: Medicare Other | Source: Ambulatory Visit | Attending: Family Medicine | Admitting: Family Medicine

## 2017-10-13 ENCOUNTER — Other Ambulatory Visit: Payer: Self-pay

## 2017-10-13 ENCOUNTER — Encounter: Payer: Self-pay | Admitting: Family Medicine

## 2017-10-13 ENCOUNTER — Ambulatory Visit (INDEPENDENT_AMBULATORY_CARE_PROVIDER_SITE_OTHER): Payer: Medicare Other | Admitting: Family Medicine

## 2017-10-13 VITALS — BP 134/84 | HR 76 | Temp 98.5°F | Ht 58.5 in | Wt 148.6 lb

## 2017-10-13 DIAGNOSIS — Z01419 Encounter for gynecological examination (general) (routine) without abnormal findings: Secondary | ICD-10-CM

## 2017-10-13 DIAGNOSIS — M949 Disorder of cartilage, unspecified: Secondary | ICD-10-CM

## 2017-10-13 DIAGNOSIS — N898 Other specified noninflammatory disorders of vagina: Secondary | ICD-10-CM | POA: Insufficient documentation

## 2017-10-13 DIAGNOSIS — M899 Disorder of bone, unspecified: Secondary | ICD-10-CM

## 2017-10-13 DIAGNOSIS — Z23 Encounter for immunization: Secondary | ICD-10-CM

## 2017-10-13 LAB — CBC WITH DIFFERENTIAL/PLATELET
Basophils Absolute: 0 10*3/uL (ref 0.0–0.1)
Basophils Relative: 0.2 % (ref 0.0–3.0)
Eosinophils Absolute: 0 10*3/uL (ref 0.0–0.7)
Eosinophils Relative: 0.7 % (ref 0.0–5.0)
HCT: 40.3 % (ref 36.0–46.0)
HEMOGLOBIN: 13.5 g/dL (ref 12.0–15.0)
LYMPHS ABS: 4.6 10*3/uL — AB (ref 0.7–4.0)
Lymphocytes Relative: 61.3 % — ABNORMAL HIGH (ref 12.0–46.0)
MCHC: 33.6 g/dL (ref 30.0–36.0)
MCV: 96 fl (ref 78.0–100.0)
MONOS PCT: 8.6 % (ref 3.0–12.0)
Monocytes Absolute: 0.6 10*3/uL (ref 0.1–1.0)
Neutro Abs: 2.2 10*3/uL (ref 1.4–7.7)
Neutrophils Relative %: 29.2 % — ABNORMAL LOW (ref 43.0–77.0)
Platelets: 298 10*3/uL (ref 150.0–400.0)
RBC: 4.2 Mil/uL (ref 3.87–5.11)
RDW: 13.6 % (ref 11.5–15.5)
WBC: 7.5 10*3/uL (ref 4.0–10.5)

## 2017-10-13 LAB — COMPREHENSIVE METABOLIC PANEL
ALBUMIN: 4.2 g/dL (ref 3.5–5.2)
ALK PHOS: 79 U/L (ref 39–117)
ALT: 24 U/L (ref 0–35)
AST: 29 U/L (ref 0–37)
BUN: 13 mg/dL (ref 6–23)
CHLORIDE: 104 meq/L (ref 96–112)
CO2: 29 mEq/L (ref 19–32)
Calcium: 9 mg/dL (ref 8.4–10.5)
Creatinine, Ser: 0.66 mg/dL (ref 0.40–1.20)
GFR: 95.41 mL/min (ref >60.00)
Glucose, Bld: 90 mg/dL (ref 70–99)
POTASSIUM: 4.1 meq/L (ref 3.5–5.1)
Sodium: 142 mEq/L (ref 135–145)
TOTAL PROTEIN: 6.6 g/dL (ref 6.0–8.3)
Total Bilirubin: 0.4 mg/dL (ref 0.2–1.2)

## 2017-10-13 LAB — LIPID PANEL
CHOLESTEROL: 201 mg/dL — AB (ref 0–200)
HDL: 76 mg/dL (ref >39.00)
LDL Cholesterol: 106 mg/dL — ABNORMAL HIGH (ref 0–99)
NonHDL: 125.05
TRIGLYCERIDES: 95 mg/dL (ref 0.0–149.0)
Total CHOL/HDL Ratio: 3
VLDL: 19 mg/dL (ref 0.0–40.0)

## 2017-10-13 LAB — TSH: TSH: 2.85 u[IU]/mL (ref 0.35–4.50)

## 2017-10-13 LAB — VITAMIN D 25 HYDROXY (VIT D DEFICIENCY, FRACTURES): VITD: 26.06 ng/mL — AB (ref 30.00–100.00)

## 2017-10-13 MED ORDER — ONDANSETRON HCL 4 MG PO TABS: 4.0000 mg | ORAL_TABLET | Freq: Four times a day (QID) | ORAL | 0 refills | Status: DC

## 2017-10-13 MED FILL — ONDANSETRON HCL 4 MG TABLET: 4 | 5 days supply | Qty: 30 | Fill #0

## 2017-10-13 NOTE — Progress Notes (Signed)
Subjective:   Patient ID: [name] [name], female    DOB: 1950/11/03, 67 y.o.   MRN: [id number]  [name] [name] is a pleasant 67 y.o. year old female who presents to clinic today with Annual Exam (Patient is here today for a CPE with PAP.  She is currently fasting.  She agrees to get her PNV-13 today.  Last Mammogram completed on 3.26.19 and WNL.  Last BMD was on 6.29.15 and was on Prolia at one point.  She is unsure if she needs another one or just test Vit-D.  She is back at the gym and doing light weight bearing exercises.)  on 10/13/2017  HPI:  Health Maintenance  Topic Date Due  . PAP SMEAR  08/14/2016  . PNA vac Low Risk Adult (1 of 2 - PCV13) 05/19/2017  . INFLUENZA VACCINE  11/03/2017  . MAMMOGRAM  06/29/2018  . TETANUS/TDAP  10/24/2018  . COLONOSCOPY  08/10/2019  . DEXA SCAN  Completed  . Hepatitis C Screening  Completed  . HIV Screening  Completed    Remote h/o hysterectomy but does still have a cervix. Mammogram 06/29/17 Colonoscopy 08/09/16  Remote h/o gastric sleeve- 04/13/16.  Has gained 10 pounds since last year.  She is back in the gym.  Wt Readings from Last 3 Encounters:  10/13/17 148 lb 9.6 oz (67.4 kg)  08/16/17 149 lb 3.2 oz (67.7 kg)  10/18/16 138 lb (62.6 kg)      History of osteoporosis.  Was on prolia at one point. Due for BMD.  Has tripped and falling several times over the years.  She has noticed some intermittent vaginal discharge and dryness.  Current Outpatient Medications on File Prior to Visit  Medication Sig Dispense Refill  . bisacodyl (DULCOLAX) 5 MG EC tablet Take 5 mg by mouth 3 (three) times a week.    . Calcium Carb-Cholecalciferol (CALCIUM-VITAMIN D) 500-200 MG-UNIT tablet Take 1 tablet by mouth daily.     . fluticasone (FLONASE) 50 MCG/ACT nasal spray Place 2 sprays into both nostrils daily as needed for allergies. Reported on 04/17/2015    . Ibuprofen-Diphenhydramine Cit (MOTRIN PM) 200-38 MG TABS Take by mouth at bedtime as  needed.    Marland Kitchen ketotifen (ALAWAY) 0.025 % ophthalmic solution Place 1 drop into both eyes 2 (two) times daily as needed (dry eyes).    . meloxicam (MOBIC) 15 MG tablet Take 1 tablet (15 mg total) daily by mouth. 30 tablet 3  . nystatin (MYCOSTATIN/NYSTOP) powder Apply topically 2 (two) times daily. 60 g 1  . ondansetron (ZOFRAN) 4 MG tablet Take 1 tablet (4 mg total) by mouth every 6 (six) hours. 12 tablet 0   No current facility-administered medications on file prior to visit.     Allergies  Allergen Reactions  . Augmentin [Amoxicillin-Pot Clavulanate] Diarrhea and Nausea Only  . Morphine And Related Other (See Comments)    DIZZINESS HYPOTENSION HYPOXIA    Past Medical History:  Diagnosis Date  . Allergy   . Anemia    IN THE PAST WITH PREGNANCY  . Arthritis    LEFT FOOT; OCCAS LOWER BACK PAIN - HX OF VERTEBRAL FRACTURES  . Blood transfusion without reported diagnosis    DURING PREGNANCY  . Complication of anesthesia    a little sluggist when waking up-no intervention required  . Coronary artery spasm (HCC) 1990'S   AFTER OTC DIET PILLS - PT EXPERIENCED CHEST PAIN- PT HAD CARDIAC CATH - TOLD NO CAD - THOUGHT TO HAVE  HAS CORNARY ARTERY SPASM  . H/O seasonal allergies   . Headache(784.0)    OCCAS SINUS HEADACHES  . Hearing impaired    LEFT EAR DEAFNESS  . Hypertension    220/104 IN SPRING 2015 - SAW HER DOCTOR - BUT B/P NORMALIZED - PT DECIDED TO STOP CAFFEINE AND HAS NOT HAD ANY ELEVATED PRESSURES SINCE JUNE  . Osteopenia   . Strabismus    HAD STRABISMUS SURGERY 1956 - BUT STILL HAS STRABISMUS  . Vitamin D deficiency     Past Surgical History:  Procedure Laterality Date  . ABDOMINAL HYSTERECTOMY    . APPENDECTOMY    . BREATH TEK H PYLORI N/A 10/08/2013   Procedure: BREATH TEK H PYLORI;  Surgeon: Gayland Curry, MD;  Location: [facility];  Service: General;  Laterality: N/A;  . BREATH TEK H PYLORI N/A 12/03/2013   Procedure: BREATH TEK Kandis Ban;  Surgeon: Gayland Curry,  MD;  Location: [facility];  Service: General;  Laterality: N/A;  . CHOLECYSTECTOMY N/A 04/13/2016   Procedure: LAPAROSCOPIC CHOLECYSTECTOMY WITH ATTEMPTED INTRAOPERATIVE CHOLANGIOGRAM;  Surgeon: Greer Pickerel, MD;  Location: [place];  Service: General;  Laterality: N/A;  . COLONOSCOPY    . EYE SURGERY Bilateral 2017   cataract surgery with lens implant  . INNER EAR SURGERY Left 1965   RADICAL MASTOIDECTOMY  . LAPAROSCOPIC GASTRIC SLEEVE RESECTION N/A 12/18/2013   Procedure: LAPAROSCOPIC GASTRIC SLEEVE RESECTION;  Surgeon: Greer Pickerel, MD;  Location: [facility];  Service: General;  Laterality: N/A;  . REPAIR TENDONS FOOT Right 1994   AND RECONSTRUCTIVE SURGERY FOR MULTIPLE FRACTURES OF THE RIGHT FOOT  . STOMACH SURGERY    . strabismus repair    . TONSILLECTOMY      Family History  Problem Relation Age of Onset  . Hypertension Mother   . Diabetes Mother   . Obesity Mother   . Heart disease Mother   . Alcohol abuse Father   . COPD Other   . Hyperlipidemia Other   . Colon cancer Brother 83  . Stomach cancer Neg Hx   . Rectal cancer Neg Hx   . Esophageal cancer Neg Hx   . Pancreatic cancer Neg Hx     Social History   Socioeconomic History  . Marital status: Married    Spouse name: Not on file  . Number of children: 2  . Years of education: Not on file  . Highest education level: Not on file  Occupational History  . Occupation: Programmer, multimedia: [employer]  . Financial resource strain: Not on file  . Food insecurity:    Worry: Not on file    Inability: Not on file  . Transportation needs:    Medical: Not on file    Non-medical: Not on file  Tobacco Use  . Smoking status: Never Smoker  . Smokeless tobacco: Never Used  Substance and Sexual Activity  . Alcohol use: No    Alcohol/week: 0.0 oz  . Drug use: No  . Sexual activity: Not on file  Lifestyle  . Physical activity:    Days per week: Not on file    Minutes per session: Not on file  . Stress: Not on file   Relationships  . Social connections:    Talks on phone: Not on file    Gets together: Not on file    Attends religious service: Not on file    Active member of club or organization: Not on file  Attends meetings of clubs or organizations: Not on file    Relationship status: Not on file  . Intimate partner violence:    Fear of current or ex partner: Not on file    Emotionally abused: Not on file    Physically abused: Not on file    Forced sexual activity: Not on file  Other Topics Concern  . 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   Married with 2 offspring   The PMH, PSH, Social History, Family History, Medications, and allergies have been reviewed in [facility], and have been updated if relevant.   Review of Systems  Constitutional: Negative.   HENT: Negative.   Eyes: Negative.   Respiratory: Negative.   Cardiovascular: Negative.   Gastrointestinal: Negative.   Endocrine: Negative.   Genitourinary: Positive for vaginal discharge. Negative for decreased urine volume, difficulty urinating, dyspareunia, dysuria, enuresis, flank pain, frequency, genital sores, hematuria, menstrual problem, pelvic pain, urgency, vaginal bleeding and vaginal pain.  Musculoskeletal: Negative.   Allergic/Immunologic: Negative.   Neurological: Negative.   Hematological: Negative.   Psychiatric/Behavioral: Negative.   All other systems reviewed and are negative.      Objective:    BP 134/84 (BP Location: Left Arm, Patient Position: Sitting, Cuff Size: Normal)   Pulse 76   Temp 98.5 F (36.9 C) (Oral)   Ht 4' 10.5" (1.486 m)   Wt 148 lb 9.6 oz (67.4 kg)   SpO2 97%   BMI 30.53 kg/m    Physical Exam   General:  Well-developed,well-nourished,in no acute distress; alert,appropriate and cooperative throughout examination Head:  normocephalic and atraumatic.   Eyes:  vision grossly intact,  PERRL Ears:  R ear normal and L ear normal externally, TMs clear bilaterally Nose:  no external deformity.   Mouth:  good dentition.   Neck:  No deformities, masses, or tenderness noted. Breasts:  No mass, nodules, thickening, tenderness, bulging, retraction, inflamation, nipple discharge or skin changes noted.   Lungs:  Normal respiratory effort, chest expands symmetrically. Lungs are clear to auscultation, no crackles or wheezes. Heart:  Normal rate and regular rhythm. S1 and S2 normal without gallop, murmur, click, rub or other extra sounds. Abdomen:  Bowel sounds positive,abdomen soft and non-tender without masses, organomegaly or hernias noted. Rectal:  no external abnormalities.   Genitalia:  Pelvic Exam:        External: normal female genitalia without lesions or masses        Vagina: normal without lesions or masses        Cervix: normal without lesions or masses        Adnexa: normal bimanual exam without masses or fullness        Uterus: absent        Pap smear: performed Msk:  No deformity or scoliosis noted of thoracic or lumbar spine.   Extremities:  No clubbing, cyanosis, edema, or deformity noted with normal full range of motion of all joints.   Neurologic:  alert & oriented X3 and gait normal.   Skin:  Intact without suspicious lesions or rashes Cervical Nodes:  No lymphadenopathy noted Axillary Nodes:  No palpable lymphadenopathy Psych:  Cognition and judgment appear intact. Alert and cooperative with normal attention span and concentration. No apparent delusions, illusions, hallucinations       Assessment & Plan:   Well woman exam with routine gynecological exam - Plan: Cytology -  PAP, CBC with Differential/Platelet, Comprehensive metabolic panel, Lipid panel, TSH  Need for pneumococcal vaccination - Plan: Pneumococcal conjugate vaccine 13-valent  Disorder of bone and cartilage - Plan: Vitamin D (25 hydroxy), DG Bone Density  Vaginal discharge - Plan: Cytology -  PAP No follow-ups on file.

## 2017-10-13 NOTE — Assessment & Plan Note (Signed)
Reviewed preventive care protocols, scheduled due services, and updated immunizations Discussed nutrition, exercise, diet, and healthy lifestyle.  Pap smear done today. 

## 2017-10-13 NOTE — Assessment & Plan Note (Signed)
H/o osteoporosis, has been on prolia in past. Bone density ordered today.

## 2017-10-13 NOTE — Patient Instructions (Addendum)
Great to see you. I will call you with your results from today and you can view them online.   You can call  at [facility] to schedule your bone density.- [phone]

## 2017-10-14 LAB — CYTOLOGY - PAP
BACTERIAL VAGINITIS: NEGATIVE
CANDIDA VAGINITIS: NEGATIVE
Diagnosis: NEGATIVE
HPV: NOT DETECTED

## 2017-10-16 ENCOUNTER — Encounter: Payer: Self-pay | Admitting: Family Medicine

## 2017-10-21 ENCOUNTER — Ambulatory Visit (INDEPENDENT_AMBULATORY_CARE_PROVIDER_SITE_OTHER)
Admission: RE | Admit: 2017-10-21 | Discharge: 2017-10-21 | Disposition: A | Discharge status: Home / Self Care | Payer: Medicare Other | Source: Ambulatory Visit | Attending: Family Medicine | Admitting: Family Medicine

## 2017-10-21 DIAGNOSIS — M949 Disorder of cartilage, unspecified: Secondary | ICD-10-CM

## 2017-10-21 DIAGNOSIS — M899 Disorder of bone, unspecified: Secondary | ICD-10-CM

## 2017-10-24 ENCOUNTER — Encounter: Payer: Self-pay | Admitting: Family Medicine

## 2017-11-15 MED FILL — MELOXICAM 15 MG TABLET: 15 | 30 days supply | Qty: 30 | Fill #1

## 2017-11-22 ENCOUNTER — Encounter: Payer: Self-pay | Admitting: Family Medicine

## 2017-11-22 NOTE — Telephone Encounter (Signed)
Pt called to schedule appt; and questioned if waiting a week out for an appt will be fine; pt will go along w/ pcp's choice, contact to advise

## 2017-11-24 ENCOUNTER — Encounter: Payer: Self-pay | Admitting: Family Medicine

## 2017-11-24 ENCOUNTER — Ambulatory Visit (INDEPENDENT_AMBULATORY_CARE_PROVIDER_SITE_OTHER): Payer: Medicare Other | Admitting: Family Medicine

## 2017-11-24 VITALS — BP 116/76 | HR 80 | Temp 98.1°F | Ht 58.5 in | Wt 144.6 lb

## 2017-11-24 DIAGNOSIS — R1012 Left upper quadrant pain: Secondary | ICD-10-CM | POA: Diagnosis not present

## 2017-11-24 DIAGNOSIS — Z9884 Bariatric surgery status: Secondary | ICD-10-CM | POA: Diagnosis not present

## 2017-11-24 LAB — LIPASE: Lipase: 51 U/L (ref 11.0–59.0)

## 2017-11-24 LAB — COMPREHENSIVE METABOLIC PANEL
ALK PHOS: 77 U/L (ref 39–117)
ALT: 17 U/L (ref 0–35)
AST: 24 U/L (ref 0–37)
Albumin: 4.7 g/dL (ref 3.5–5.2)
BUN: 20 mg/dL (ref 6–23)
CO2: 31 mEq/L (ref 19–32)
Calcium: 10 mg/dL (ref 8.4–10.5)
Chloride: 100 mEq/L (ref 96–112)
Creatinine, Ser: 0.76 mg/dL (ref 0.40–1.20)
GFR: 81.05 mL/min (ref >60.00)
GLUCOSE: 91 mg/dL (ref 70–99)
POTASSIUM: 4.4 meq/L (ref 3.5–5.1)
Sodium: 139 mEq/L (ref 135–145)
TOTAL PROTEIN: 7.3 g/dL (ref 6.0–8.3)
Total Bilirubin: 0.5 mg/dL (ref 0.2–1.2)

## 2017-11-24 LAB — CBC WITH DIFFERENTIAL/PLATELET
BASOS PCT: 0.3 % (ref 0.0–3.0)
Basophils Absolute: 0 10*3/uL (ref 0.0–0.1)
Eosinophils Absolute: 0.1 10*3/uL (ref 0.0–0.7)
Eosinophils Relative: 1.2 % (ref 0.0–5.0)
HCT: 43.1 % (ref 36.0–46.0)
Hemoglobin: 14.4 g/dL (ref 12.0–15.0)
LYMPHS ABS: 3.8 10*3/uL (ref 0.7–4.0)
Lymphocytes Relative: 49.1 % — ABNORMAL HIGH (ref 12.0–46.0)
MCHC: 33.4 g/dL (ref 30.0–36.0)
MCV: 96.4 fl (ref 78.0–100.0)
MONOS PCT: 8.6 % (ref 3.0–12.0)
Monocytes Absolute: 0.7 10*3/uL (ref 0.1–1.0)
NEUTROS PCT: 40.8 % — AB (ref 43.0–77.0)
Neutro Abs: 3.1 10*3/uL (ref 1.4–7.7)
Platelets: 331 10*3/uL (ref 150.0–400.0)
RBC: 4.47 Mil/uL (ref 3.87–5.11)
RDW: 13.7 % (ref 11.5–15.5)
WBC: 7.7 10*3/uL (ref 4.0–10.5)

## 2017-11-24 NOTE — Assessment & Plan Note (Signed)
New- does seem to be improving. Discussed with pt that differential is wide especially after bariatric surgery- anatomy has changed and I want to follow her closely. Will check labs today for further evaluation/pancreatitis rule out- lipase, cbc, cmet.  If pain does not continue to improve/resolve, will order CT of abdomen. The patient indicates understanding of these issues and agrees with the plan.  Orders Placed This Encounter  Procedures  . Lipase  . CBC with Differential/Platelet  . Comprehensive metabolic panel

## 2017-11-24 NOTE — Progress Notes (Signed)
Subjective:   Patient ID: [name] [name], female    DOB: 07/21/1950, 67 y.o.   MRN: [id number]  [name] [name] is a pleasant 67 y.o. year old female who presents to clinic today with Abdominal Pain (Patient is here today due to abd pain.  Located in epigastric region and would radiate to umbilical and left hypochondriac regions.  Start on 8.18.19 and severe in nature.  It let up on 8.19.19 until she ate food then began again.  Tolerated blan diet and yogurt.  Yesterday still some discomfort after eating and was minimal last night with grilled chicken.  Has not eaten this am. )  on 11/24/2017  HPI:  Abdominal pain- started on 11/20/17- severe in nature, intermittent.  Seems to be worse when she eats.  When pain subsides, she still has some general tenderness.   She does feel it is getting better.  Yesterday she did have some discomfort after eating, it was minimal last night after she ate grilled chicken. Has not eaten today.  No diarrhea or vomiting but does experience nausea when the pain is severe. No fever. No blood in her stool.  She does have a remote h/o bariatric surgery (gastric sleeve 04/13/16)- has an appointment scheduled with her bariatric surgeon, Dr. [name] for yearly post op on 12/14/17.  Neg CT abd/pelvis- 10/15/16  IMPRESSION: 1. No renal stones or obstructive uropathy. No acute abnormality in the abdomen/pelvis. 2. Moderate diffuse colonic stool burden, can be seen with constipation.  She has had left sided abdominal pain in past but never this type of severe, post prandial pain.  Current Outpatient Medications on File Prior to Visit  Medication Sig Dispense Refill  . bisacodyl (DULCOLAX) 5 MG EC tablet Take 5 mg by mouth 3 (three) times a week.    . Calcium 600-200 MG-UNIT tablet Take 1 tablet by mouth daily.    . fluticasone (FLONASE) 50 MCG/ACT nasal spray Place 2 sprays into both nostrils daily as needed for allergies. Reported on 04/17/2015    .  Ibuprofen-Diphenhydramine Cit (MOTRIN PM) 200-38 MG TABS Take by mouth at bedtime as needed.    Marland Kitchen ketotifen (ALAWAY) 0.025 % ophthalmic solution Place 1 drop into both eyes 2 (two) times daily as needed (dry eyes).    . meloxicam (MOBIC) 15 MG tablet Take 1 tablet (15 mg total) daily by mouth. 30 tablet 3  . nystatin (MYCOSTATIN/NYSTOP) powder Apply topically 2 (two) times daily. 60 g 1  . ondansetron (ZOFRAN) 4 MG tablet Take 1 tablet (4 mg total) by mouth every 6 (six) hours. 30 tablet 0  . vitamin B-12 (CYANOCOBALAMIN) 500 MCG tablet Take 500 mcg by mouth daily.    . Vitamin D, Cholecalciferol, 1000 units TABS Take 1 tablet by mouth daily.     No current facility-administered medications on file prior to visit.     Allergies  Allergen Reactions  . Augmentin [Amoxicillin-Pot Clavulanate] Diarrhea and Nausea Only  . Morphine And Related Other (See Comments)    DIZZINESS HYPOTENSION HYPOXIA    Past Medical History:  Diagnosis Date  . Allergy   . Anemia    IN THE PAST WITH PREGNANCY  . Arthritis    LEFT FOOT; OCCAS LOWER BACK PAIN - HX OF VERTEBRAL FRACTURES  . Blood transfusion without reported diagnosis    DURING PREGNANCY  . Complication of anesthesia    a little sluggist when waking up-no intervention required  . Coronary artery spasm (HCC) 1990'S   AFTER OTC  DIET PILLS - PT EXPERIENCED CHEST PAIN- PT HAD CARDIAC CATH - TOLD NO CAD - THOUGHT TO HAVE HAS CORNARY ARTERY SPASM  . H/O seasonal allergies   . Headache(784.0)    OCCAS SINUS HEADACHES  . Hearing impaired    LEFT EAR DEAFNESS  . Hypertension    220/104 IN SPRING 2015 - SAW HER DOCTOR - BUT B/P NORMALIZED - PT DECIDED TO STOP CAFFEINE AND HAS NOT HAD ANY ELEVATED PRESSURES SINCE JUNE  . Osteopenia   . Strabismus    HAD STRABISMUS SURGERY 1956 - BUT STILL HAS STRABISMUS  . Vitamin D deficiency     Past Surgical History:  Procedure Laterality Date  . ABDOMINAL HYSTERECTOMY    . APPENDECTOMY    . BREATH TEK H  PYLORI N/A 10/08/2013   Procedure: BREATH TEK H PYLORI;  Surgeon: Gayland Curry, MD;  Location: [facility];  Service: General;  Laterality: N/A;  . BREATH TEK H PYLORI N/A 12/03/2013   Procedure: BREATH TEK Kandis Ban;  Surgeon: Gayland Curry, MD;  Location: [facility];  Service: General;  Laterality: N/A;  . CHOLECYSTECTOMY N/A 04/13/2016   Procedure: LAPAROSCOPIC CHOLECYSTECTOMY WITH ATTEMPTED INTRAOPERATIVE CHOLANGIOGRAM;  Surgeon: Greer Pickerel, MD;  Location: [facility];  Service: General;  Laterality: N/A;  . COLONOSCOPY    . EYE SURGERY Bilateral 2017   cataract surgery with lens implant  . INNER EAR SURGERY Left 1965   RADICAL MASTOIDECTOMY  . LAPAROSCOPIC GASTRIC SLEEVE RESECTION N/A 12/18/2013   Procedure: LAPAROSCOPIC GASTRIC SLEEVE RESECTION;  Surgeon: Greer Pickerel, MD;  Location: [facility];  Service: General;  Laterality: N/A;  . REPAIR TENDONS FOOT Right 1994   AND RECONSTRUCTIVE SURGERY FOR MULTIPLE FRACTURES OF THE RIGHT FOOT  . STOMACH SURGERY    . strabismus repair    . TONSILLECTOMY      Family History  Problem Relation Age of Onset  . Hypertension Mother   . Diabetes Mother   . Obesity Mother   . Heart disease Mother   . Alcohol abuse Father   . COPD Other   . Hyperlipidemia Other   . Colon cancer Brother 34  . Stomach cancer Neg Hx   . Rectal cancer Neg Hx   . Esophageal cancer Neg Hx   . Pancreatic cancer Neg Hx     Social History   Socioeconomic History  . Marital status: Married    Spouse name: Not on file  . Number of children: 2  . Years of education: Not on file  . Highest education level: Not on file  Occupational History  . Occupation: Programmer, multimedia: [employer]  . Financial resource strain: Not on file  . Food insecurity:    Worry: Not on file    Inability: Not on file  . Transportation needs:    Medical: Not on file    Non-medical: Not on file  Tobacco Use  . Smoking status: Never Smoker  . Smokeless tobacco: Never Used    Substance and Sexual Activity  . Alcohol use: No    Alcohol/week: 0.0 standard drinks  . Drug use: No  . Sexual activity: Not on file  Lifestyle  . Physical activity:    Days per week: Not on file    Minutes per session: Not on file  . Stress: Not on file  Relationships  . Social connections:    Talks on phone: Not on file    Gets together: Not on file  Attends religious service: Not on file    Active member of club or organization: Not on file    Attends meetings of clubs or organizations: Not on file    Relationship status: Not on file  . Intimate partner violence:    Fear of current or ex partner: Not on file    Emotionally abused: Not on file    Physically abused: Not on file    Forced sexual activity: Not on file  Other Topics Concern  . 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   Married with 2 offspring   The PMH, PSH, Social History, Family History, Medications, and allergies have been reviewed in [facility], and have been updated if relevant.  Review of Systems  Constitutional: Positive for chills. Negative for diaphoresis and fever.  Respiratory: Negative.   Cardiovascular: Negative.   Gastrointestinal: Positive for abdominal pain and nausea. Negative for abdominal distention, anal bleeding, blood in stool, constipation, diarrhea, rectal pain and vomiting.  Musculoskeletal: Negative.   Skin: Negative.   Neurological: Negative.   Hematological: Negative.   Psychiatric/Behavioral: Negative.   All other systems reviewed and are negative.      Objective:    BP 116/76 (BP Location: Left Arm, Patient Position: Sitting, Cuff Size: Normal)   Pulse 80   Temp 98.1 F (36.7 C) (Oral)   Ht 4' 10.5" (1.486 m)   Wt 144 lb 9.6 oz (65.6 kg)   SpO2 98%   BMI 29.71 kg/m    Physical Exam  Constitutional: She is oriented to person, place, and time. She appears  well-developed and well-nourished.  Non-toxic appearance. She does not appear ill.  HENT:  Head: Normocephalic and atraumatic.  Eyes: EOM are normal.  Cardiovascular: Normal rate.  Pulmonary/Chest: Effort normal.  Abdominal: Normal appearance. There is tenderness in the epigastric area. There is no rigidity, no rebound, no guarding and negative Murphy's sign.  Neurological: She is alert and oriented to person, place, and time.  Skin: Skin is warm and dry.  Psychiatric: She has a normal mood and affect. Her behavior is normal.  Nursing note and vitals reviewed.         Assessment & Plan:   Left upper quadrant pain - Plan: Lipase, CBC with Differential/Platelet, Comprehensive metabolic panel  S/P laparoscopic sleeve gastrectomy No follow-ups on file.

## 2017-11-24 NOTE — Patient Instructions (Signed)
Great to see you. I will call you with your lab results from today and you can view them online.   Please keep me updated.

## 2017-11-28 ENCOUNTER — Encounter: Payer: Self-pay | Admitting: Family Medicine

## 2017-12-14 ENCOUNTER — Encounter: Payer: Self-pay | Admitting: Family Medicine

## 2018-02-27 ENCOUNTER — Encounter: Payer: Self-pay | Admitting: Family Medicine

## 2018-02-28 ENCOUNTER — Other Ambulatory Visit: Payer: Self-pay | Admitting: Family Medicine

## 2018-02-28 MED ORDER — CIPROFLOXACIN HCL 250 MG PO TABS: 250.0000 mg | ORAL_TABLET | Freq: Two times a day (BID) | ORAL | 0 refills | Status: DC

## 2018-02-28 MED FILL — CIPROFLOXACIN HCL 250 MG TA: 250 | 5 days supply | Qty: 10 | Fill #0

## 2018-03-31 ENCOUNTER — Emergency Department (HOSPITAL_COMMUNITY)
Admission: EM | Admit: 2018-03-31 | Discharge: 2018-04-01 | Disposition: A | Discharge status: Home / Self Care | Payer: Medicare Other | Attending: Emergency Medicine | Admitting: Emergency Medicine

## 2018-03-31 ENCOUNTER — Encounter (HOSPITAL_COMMUNITY): Payer: Self-pay | Admitting: Emergency Medicine

## 2018-03-31 DIAGNOSIS — S299XXA Unspecified injury of thorax, initial encounter: Secondary | ICD-10-CM | POA: Diagnosis present

## 2018-03-31 DIAGNOSIS — Z79899 Other long term (current) drug therapy: Secondary | ICD-10-CM | POA: Diagnosis not present

## 2018-03-31 DIAGNOSIS — Y929 Unspecified place or not applicable: Secondary | ICD-10-CM | POA: Insufficient documentation

## 2018-03-31 DIAGNOSIS — S39012A Strain of muscle, fascia and tendon of lower back, initial encounter: Secondary | ICD-10-CM

## 2018-03-31 DIAGNOSIS — W01198A Fall on same level from slipping, tripping and stumbling with subsequent striking against other object, initial encounter: Secondary | ICD-10-CM | POA: Diagnosis not present

## 2018-03-31 DIAGNOSIS — Y939 Activity, unspecified: Secondary | ICD-10-CM | POA: Insufficient documentation

## 2018-03-31 DIAGNOSIS — I10 Essential (primary) hypertension: Secondary | ICD-10-CM | POA: Diagnosis not present

## 2018-03-31 DIAGNOSIS — R1084 Generalized abdominal pain: Secondary | ICD-10-CM | POA: Insufficient documentation

## 2018-03-31 DIAGNOSIS — Y998 Other external cause status: Secondary | ICD-10-CM | POA: Diagnosis not present

## 2018-03-31 DIAGNOSIS — W19XXXA Unspecified fall, initial encounter: Secondary | ICD-10-CM

## 2018-03-31 DIAGNOSIS — S29012A Strain of muscle and tendon of back wall of thorax, initial encounter: Secondary | ICD-10-CM | POA: Diagnosis not present

## 2018-03-31 NOTE — ED Notes (Signed)
Pt asking for Pain Medicine

## 2018-03-31 NOTE — ED Triage Notes (Signed)
Pt had a mechanical fall this evening, falling against her car (wheel/well). No LOC, not on blood thinners, pain to left lumbar to flank pain.  Prior hx of same area injury. States this pain is "radiating, not just muscular/skeletal."  States she is feeling weakness in left leg feels like it is due to pain.  Pt took an "old pain pill about 4:15, did nothing."

## 2018-04-01 ENCOUNTER — Emergency Department (HOSPITAL_COMMUNITY): Payer: Medicare Other

## 2018-04-01 LAB — CBC
HEMATOCRIT: 38.5 % (ref 36.0–46.0)
Hemoglobin: 12.7 g/dL (ref 12.0–15.0)
MCH: 31.8 pg (ref 26.0–34.0)
MCHC: 33 g/dL (ref 30.0–36.0)
MCV: 96.5 fL (ref 80.0–100.0)
PLATELETS: 306 10*3/uL (ref 150–400)
RBC: 3.99 MIL/uL (ref 3.87–5.11)
RDW: 13.1 % (ref 11.5–15.5)
WBC: 8 10*3/uL (ref 4.0–10.5)
nRBC: 0 % (ref 0.0–0.2)

## 2018-04-01 LAB — I-STAT CHEM 8, ED
BUN: 12 mg/dL (ref 8–23)
CALCIUM ION: 1.06 mmol/L — AB (ref 1.15–1.40)
CHLORIDE: 106 mmol/L (ref 98–111)
Creatinine, Ser: 0.6 mg/dL (ref 0.44–1.00)
GLUCOSE: 93 mg/dL (ref 70–99)
HCT: 39 % (ref 36.0–46.0)
HEMOGLOBIN: 13.3 g/dL (ref 12.0–15.0)
Potassium: 3.5 mmol/L (ref 3.5–5.1)
Sodium: 141 mmol/L (ref 135–145)
TCO2: 26 mmol/L (ref 22–32)

## 2018-04-01 MED ORDER — DIAZEPAM 5 MG PO TABS: 5.0000 mg | ORAL_TABLET | Freq: Three times a day (TID) | ORAL | 0 refills | Status: DC | PRN

## 2018-04-01 MED ORDER — HYDROMORPHONE HCL 1 MG/ML IJ SOLN
0.5000 mg | Freq: Once | INTRAMUSCULAR | Status: AC
  Administered 2018-04-01: 0.5 mg via INTRAVENOUS
  Filled 2018-04-01: qty 1

## 2018-04-01 MED ORDER — FENTANYL CITRATE (PF) 100 MCG/2ML IJ SOLN
100.0000 ug | Freq: Once | INTRAMUSCULAR | Status: AC
  Administered 2018-04-01: 100 ug via INTRAVENOUS
  Filled 2018-04-01: qty 2

## 2018-04-01 MED ORDER — KETOROLAC TROMETHAMINE 15 MG/ML IJ SOLN
15.0000 mg | Freq: Once | INTRAMUSCULAR | Status: AC
  Administered 2018-04-01: 15 mg via INTRAVENOUS
  Filled 2018-04-01: qty 1

## 2018-04-01 MED ORDER — IOHEXOL 300 MG/ML  SOLN
100.0000 mL | Freq: Once | INTRAMUSCULAR | Status: AC | PRN
  Administered 2018-04-01: 100 mL via INTRAVENOUS

## 2018-04-01 MED ORDER — DIAZEPAM 5 MG PO TABS
5.0000 mg | ORAL_TABLET | Freq: Once | ORAL | Status: AC
  Administered 2018-04-01: 5 mg via ORAL
  Filled 2018-04-01: qty 1

## 2018-04-01 NOTE — Discharge Instructions (Signed)
If you develop vomiting, fever, worsening pain, weakness or numbness in the legs, bowel or bladder incontinence, or any other new/concerning symptoms then return to the ER for evaluation.  Do not combine the Valium with other medicines or alcohol and do not take when driving or operating heavy machinery.

## 2018-04-01 NOTE — ED Provider Notes (Signed)
[facility] EMERGENCY DEPARTMENT Provider Note   CSN: [id number] Arrival date & time: 03/31/18  1751     History   Chief Complaint Chief Complaint  Patient presents with  . Fall  . Back Pain    HPI [name] [name] is a 67 y.o. female.  HPI  67 year old female presents with severe left-sided back pain.  She was walking to her car and she slipped on a step and landed against the wheel well on her left back/flank.  For several hours she is been having severe pain.  It radiates across to her left upper abdomen.  She feels like her left leg is dragging but she thinks this is pain related as opposed to weakness.  Had a little bit of tingling but that is gone.  No numbness.  Tried a leftover hydrocodone from over 1 year ago without relief.  No chest pain or shortness of breath.  Did not hit her head.  Has urinated and did not have any blood.  No bowel or bladder incontinence.  Past Medical History:  Diagnosis Date  . Allergy   . Anemia    IN THE PAST WITH PREGNANCY  . Arthritis    LEFT FOOT; OCCAS LOWER BACK PAIN - HX OF VERTEBRAL FRACTURES  . Blood transfusion without reported diagnosis    DURING PREGNANCY  . Complication of anesthesia    a little sluggist when waking up-no intervention required  . Coronary artery spasm (HCC) 1990'S   AFTER OTC DIET PILLS - PT EXPERIENCED CHEST PAIN- PT HAD CARDIAC CATH - TOLD NO CAD - THOUGHT TO HAVE HAS CORNARY ARTERY SPASM  . H/O seasonal allergies   . Headache(784.0)    OCCAS SINUS HEADACHES  . Hearing impaired    LEFT EAR DEAFNESS  . Hypertension    220/104 IN SPRING 2015 - SAW HER DOCTOR - BUT B/P NORMALIZED - PT DECIDED TO STOP CAFFEINE AND HAS NOT HAD ANY ELEVATED PRESSURES SINCE JUNE  . Osteopenia   . Strabismus    HAD STRABISMUS SURGERY 1956 - BUT STILL HAS STRABISMUS  . Vitamin D deficiency     Patient Active Problem List   Diagnosis Date Noted  . Well woman exam with routine gynecological exam 10/13/2017    . Abdominal pain 10/18/2016  . Atypical lymphocytes present on peripheral blood smear 06/02/2016  . Family history of colon cancer-brother at age 35 05/27/2016  . Conductive hearing loss of right ear 05/04/2016  . S/P laparoscopic sleeve gastrectomy 12/18/2013  . Persistent disorder of initiating or maintaining sleep 11/05/2013  . CORONARY ARTERY SPASM 10/17/2009  . Disorder of bone and cartilage 10/17/2009    Past Surgical History:  Procedure Laterality Date  . ABDOMINAL HYSTERECTOMY    . APPENDECTOMY    . BREATH TEK H PYLORI N/A 10/08/2013   Procedure: BREATH TEK H PYLORI;  Surgeon: Gayland Curry, MD;  Location: [facility];  Service: General;  Laterality: N/A;  . BREATH TEK H PYLORI N/A 12/03/2013   Procedure: BREATH TEK Kandis Ban;  Surgeon: Gayland Curry, MD;  Location: [facility];  Service: General;  Laterality: N/A;  . CHOLECYSTECTOMY N/A 04/13/2016   Procedure: LAPAROSCOPIC CHOLECYSTECTOMY WITH ATTEMPTED INTRAOPERATIVE CHOLANGIOGRAM;  Surgeon: Greer Pickerel, MD;  Location: [facility];  Service: General;  Laterality: N/A;  . COLONOSCOPY    . EYE SURGERY Bilateral 2017   cataract surgery with lens implant  . INNER EAR SURGERY Left 1965   RADICAL MASTOIDECTOMY  . LAPAROSCOPIC GASTRIC  SLEEVE RESECTION N/A 12/18/2013   Procedure: LAPAROSCOPIC GASTRIC SLEEVE RESECTION;  Surgeon: Greer Pickerel, MD;  Location: [facility];  Service: General;  Laterality: N/A;  . REPAIR TENDONS FOOT Right 1994   AND RECONSTRUCTIVE SURGERY FOR MULTIPLE FRACTURES OF THE RIGHT FOOT  . STOMACH SURGERY    . strabismus repair    . TONSILLECTOMY       OB History   No obstetric history on file.      Home Medications    Prior to Admission medications   Medication Sig Start Date End Date Taking? Authorizing Provider  bisacodyl (DULCOLAX) 5 MG EC tablet Take 5 mg by mouth 3 (three) times a week.   Yes [provider]  Calcium 600-200 MG-UNIT tablet Take 1 tablet by mouth daily.   Yes [provider]   vitamin B-12 (CYANOCOBALAMIN) 500 MCG tablet Take 500 mcg by mouth daily.   Yes [provider]  Vitamin D, Cholecalciferol, 1000 units TABS Take 1 tablet by mouth daily.   Yes [provider]  ciprofloxacin (CIPRO) 250 MG tablet Take 1 tablet (250 mg total) by mouth 2 (two) times daily. Patient not taking: Reported on 04/01/2018 02/28/18   Lucille Passy, MD  diazepam (VALIUM) 5 MG tablet Take 1 tablet (5 mg total) by mouth every 8 (eight) hours as needed for muscle spasms. 04/01/18   Sherwood Gambler, MD  meloxicam (MOBIC) 15 MG tablet Take 1 tablet (15 mg total) daily by mouth. Patient not taking: Reported on 04/01/2018 02/15/17   Hyatt, Max T, DPM  nystatin (MYCOSTATIN/NYSTOP) powder Apply topically 2 (two) times daily. Patient not taking: Reported on 04/01/2018 08/23/17   Lucille Passy, MD  ondansetron (ZOFRAN) 4 MG tablet Take 1 tablet (4 mg total) by mouth every 6 (six) hours. Patient not taking: Reported on 04/01/2018 10/13/17   Lucille Passy, MD    Family History Family History  Problem Relation Age of Onset  . Hypertension Mother   . Diabetes Mother   . Obesity Mother   . Heart disease Mother   . Alcohol abuse Father   . COPD Other   . Hyperlipidemia Other   . Colon cancer Brother 52  . Stomach cancer Neg Hx   . Rectal cancer Neg Hx   . Esophageal cancer Neg Hx   . Pancreatic cancer Neg Hx     Social History Social History   Tobacco Use  . Smoking status: Never Smoker  . Smokeless tobacco: Never Used  Substance Use Topics  . Alcohol use: No    Alcohol/week: 0.0 standard drinks  . Drug use: No     Allergies   Augmentin [amoxicillin-pot clavulanate] and Morphine and related   Review of Systems Review of Systems  Respiratory: Negative for shortness of breath.   Cardiovascular: Negative for chest pain.  Gastrointestinal: Positive for abdominal pain.  Genitourinary: Positive for flank pain. Negative for hematuria.  Musculoskeletal: Positive  for back pain.  All other systems reviewed and are negative.    Physical Exam Updated Vital Signs BP 100/64   Pulse 71   Temp 97.6 F (36.4 C) (Oral)   Resp 18   Ht 4\' 10"  (1.473 m)   Wt 67.1 kg   SpO2 100%   BMI 30.93 kg/m   Physical Exam Vitals signs and nursing note reviewed.  Constitutional:      Appearance: She is well-developed.     Comments: In pain  HENT:     Head: Normocephalic and atraumatic.  Right Ear: External ear normal.     Left Ear: External ear normal.     Nose: Nose normal.  Eyes:     General:        Right eye: No discharge.        Left eye: No discharge.  Cardiovascular:     Rate and Rhythm: Normal rate and regular rhythm.     Heart sounds: Normal heart sounds.  Pulmonary:     Effort: Pulmonary effort is normal.     Breath sounds: Normal breath sounds.  Abdominal:     Palpations: Abdomen is soft.     Tenderness: There is abdominal tenderness in the left upper quadrant.    Musculoskeletal:     Thoracic back: She exhibits tenderness. She exhibits no bony tenderness.     Lumbar back: She exhibits tenderness. She exhibits no bony tenderness.       Back:     Comments: No bruising/swelling to back. No midline/bony tenderness or deformity  Skin:    General: Skin is warm and dry.  Neurological:     Mental Status: She is alert.     Comments: 5/5 strength in BLE. Normal gross sensation  Psychiatric:        Mood and Affect: Mood is not anxious.      ED Treatments / Results  Labs (all labs ordered are listed, but only abnormal results are displayed) Labs Reviewed  I-STAT CHEM 8, ED - Abnormal; Notable for the following components:      Result Value   Calcium, Ion 1.06 (*)    All other components within normal limits  CBC  URINALYSIS, ROUTINE W REFLEX MICROSCOPIC    EKG None  Radiology Ct Abdomen Pelvis W Contrast  Result Date: 04/01/2018 CLINICAL DATA:  Status post fall against car, with left lumbar and flank pain. EXAM: CT  ABDOMEN AND PELVIS WITH CONTRAST TECHNIQUE: Multidetector CT imaging of the abdomen and pelvis was performed using the standard protocol following bolus administration of intravenous contrast. CONTRAST:  175mL OMNIPAQUE IOHEXOL 300 MG/ML  SOLN COMPARISON:  CT of the abdomen and pelvis performed 10/15/2016, and MRCP performed 03/23/2016 FINDINGS: Lower chest: The visualized lung bases are grossly clear. The visualized portions of the mediastinum are unremarkable. Hepatobiliary: The liver is unremarkable in appearance. The patient is status post cholecystectomy, with clips noted at the gallbladder fossa. The common bile duct remains normal in caliber. Pancreas: The pancreas is within normal limits. Spleen: The spleen is unremarkable in appearance. Adrenals/Urinary Tract: The adrenal glands are unremarkable in appearance. The kidneys are within normal limits. There is no evidence of hydronephrosis. No renal or ureteral stones are identified. No perinephric stranding is seen. Stomach/Bowel: The patient is status post sleeve gastrectomy. The remaining stomach is unremarkable in appearance. The small bowel is within normal limits. The patient is status post appendectomy. The colon is unremarkable in appearance. Vascular/Lymphatic: The abdominal aorta is unremarkable in appearance. The inferior vena cava is grossly unremarkable. No retroperitoneal lymphadenopathy is seen. No pelvic sidewall lymphadenopathy is identified. Reproductive: The bladder is mildly distended and grossly unremarkable. The patient is status post hysterectomy. No suspicious adnexal masses are seen. Other: No additional soft tissue abnormalities are seen. Musculoskeletal: No acute osseous abnormalities are identified. There is chronic loss of height at vertebral body T12. The visualized musculature is unremarkable in appearance. IMPRESSION: No evidence of traumatic injury to the abdomen or pelvis. Electronically Signed   By: Garald Balding M.D.   On:  04/01/2018 02:38  Procedures Procedures (including critical care time)  Medications Ordered in ED Medications  fentaNYL (SUBLIMAZE) injection 100 mcg (100 mcg Intravenous Given 04/01/18 0126)  iohexol (OMNIPAQUE) 300 MG/ML solution 100 mL (100 mLs Intravenous Contrast Given 04/01/18 0209)  ketorolac (TORADOL) 15 MG/ML injection 15 mg (15 mg Intravenous Given 04/01/18 0310)  HYDROmorphone (DILAUDID) injection 0.5 mg (0.5 mg Intravenous Given 04/01/18 0310)  diazepam (VALIUM) tablet 5 mg (5 mg Oral Given 04/01/18 0310)     Initial Impression / Assessment and Plan / ED Course  I have reviewed the triage vital signs and the nursing notes.  Pertinent labs & imaging results that were available during my care of the patient were reviewed by me and considered in my medical decision making (see chart for details).     Given the degree of back pain with the flank/side injury, CT obtained to help rule out renal or splenic injury.  This is benign.  There is no midline pain.  While she felt a little bit of left-sided weakness this seems more pain related.  I was able to control her pain with some Valium and Toradol.  She is now feeling much better and wants to go home.  Moving her left leg a lot better.  I highly doubt neurologic cause or spinal emergency.  She will be instructed to take some ibuprofen, Tylenol, apply heat, and has been given a short course of Valium as needed for muscle relaxation.  Likely a muscular injury.  Discharged home with return precautions.  Final Clinical Impressions(s) / ED Diagnoses   Final diagnoses:  Fall, initial encounter  Back strain, initial encounter    ED Discharge Orders         Ordered    diazepam (VALIUM) 5 MG tablet  Every 8 hours PRN     04/01/18 0451           Sherwood Gambler, MD 04/01/18 [phone]

## 2018-04-02 ENCOUNTER — Encounter: Payer: Self-pay | Admitting: Family Medicine

## 2018-04-03 ENCOUNTER — Ambulatory Visit (INDEPENDENT_AMBULATORY_CARE_PROVIDER_SITE_OTHER): Payer: Medicare Other

## 2018-04-03 ENCOUNTER — Encounter: Payer: Self-pay | Admitting: Family Medicine

## 2018-04-03 ENCOUNTER — Ambulatory Visit: Payer: Medicare Other | Admitting: Family Medicine

## 2018-04-03 VITALS — BP 124/80 | HR 101 | Ht <= 58 in | Wt 147.9 lb

## 2018-04-03 DIAGNOSIS — S2232XA Fracture of one rib, left side, initial encounter for closed fracture: Secondary | ICD-10-CM

## 2018-04-03 MED ORDER — RIB BELT/WOMENS MEDIUM MISC: 0 refills | Status: DC

## 2018-04-03 MED ORDER — TRAMADOL HCL 50 MG PO TABS: 50.0000 mg | ORAL_TABLET | Freq: Three times a day (TID) | ORAL | 0 refills | Status: DC | PRN

## 2018-04-03 MED ORDER — METHOCARBAMOL 500 MG PO TABS: 500.0000 mg | ORAL_TABLET | Freq: Three times a day (TID) | ORAL | 0 refills | Status: DC

## 2018-04-03 NOTE — Progress Notes (Signed)
Established Patient Office Visit  Subjective:  Patient ID: [name] [name], female    DOB: October 19, 1950  Age: 67 y.o. MRN: [id number]  CC:  Chief Complaint  Patient presents with  . Hospitalization Follow-up    HPI [name] presents for follow-up of a injury to her left flank sustained 3 days ago.  She missed a step going down into her garage and fell against her car.  She was seen in the emergency room out of concern for internal organ damage.  CT scan taken at that time was essentially negative.  She continues to have severe posterior left flank pain.  She was given a prescription for Valium 5 mg 3 times daily and has been taking ibuprofen 800 mg as needed.  These are only helping for a short while.  She is retiring this week and has retirement festivities planned for Friday.  Past Medical History:  Diagnosis Date  . Allergy   . Anemia    IN THE PAST WITH PREGNANCY  . Arthritis    LEFT FOOT; OCCAS LOWER BACK PAIN - HX OF VERTEBRAL FRACTURES  . Blood transfusion without reported diagnosis    DURING PREGNANCY  . Complication of anesthesia    a little sluggist when waking up-no intervention required  . Coronary artery spasm (HCC) 1990'S   AFTER OTC DIET PILLS - PT EXPERIENCED CHEST PAIN- PT HAD CARDIAC CATH - TOLD NO CAD - THOUGHT TO HAVE HAS CORNARY ARTERY SPASM  . H/O seasonal allergies   . Headache(784.0)    OCCAS SINUS HEADACHES  . Hearing impaired    LEFT EAR DEAFNESS  . Hypertension    220/104 IN SPRING 2015 - SAW HER DOCTOR - BUT B/P NORMALIZED - PT DECIDED TO STOP CAFFEINE AND HAS NOT HAD ANY ELEVATED PRESSURES SINCE JUNE  . Osteopenia   . Strabismus    HAD STRABISMUS SURGERY 1956 - BUT STILL HAS STRABISMUS  . Vitamin D deficiency     Past Surgical History:  Procedure Laterality Date  . ABDOMINAL HYSTERECTOMY    . APPENDECTOMY    . BREATH TEK H PYLORI N/A 10/08/2013   Procedure: BREATH TEK H PYLORI;  Surgeon: Gayland Curry, MD;  Location: [facility];   Service: General;  Laterality: N/A;  . BREATH TEK H PYLORI N/A 12/03/2013   Procedure: BREATH TEK Kandis Ban;  Surgeon: Gayland Curry, MD;  Location: [facility];  Service: General;  Laterality: N/A;  . CHOLECYSTECTOMY N/A 04/13/2016   Procedure: LAPAROSCOPIC CHOLECYSTECTOMY WITH ATTEMPTED INTRAOPERATIVE CHOLANGIOGRAM;  Surgeon: Greer Pickerel, MD;  Location: [facility];  Service: General;  Laterality: N/A;  . COLONOSCOPY    . EYE SURGERY Bilateral 2017   cataract surgery with lens implant  . INNER EAR SURGERY Left 1965   RADICAL MASTOIDECTOMY  . LAPAROSCOPIC GASTRIC SLEEVE RESECTION N/A 12/18/2013   Procedure: LAPAROSCOPIC GASTRIC SLEEVE RESECTION;  Surgeon: Greer Pickerel, MD;  Location: [facility];  Service: General;  Laterality: N/A;  . REPAIR TENDONS FOOT Right 1994   AND RECONSTRUCTIVE SURGERY FOR MULTIPLE FRACTURES OF THE RIGHT FOOT  . STOMACH SURGERY    . strabismus repair    . TONSILLECTOMY      Family History  Problem Relation Age of Onset  . Hypertension Mother   . Diabetes Mother   . Obesity Mother   . Heart disease Mother   . Alcohol abuse Father   . COPD Other   . Hyperlipidemia Other   . Colon cancer Brother 75  .  Stomach cancer Neg Hx   . Rectal cancer Neg Hx   . Esophageal cancer Neg Hx   . Pancreatic cancer Neg Hx     Social History   Socioeconomic History  . Marital status: Married    Spouse name: Not on file  . Number of children: 2  . Years of education: Not on file  . Highest education level: Not on file  Occupational History  . Occupation: Programmer, multimedia: [name]  . Financial resource strain: Not on file  . Food insecurity:    Worry: Not on file    Inability: Not on file  . Transportation needs:    Medical: Not on file    Non-medical: Not on file  Tobacco Use  . Smoking status: Never Smoker  . Smokeless tobacco: Never Used  Substance and Sexual Activity  . Alcohol use: No    Alcohol/week: 0.0 standard drinks  . Drug use: No  . Sexual  activity: Not on file  Lifestyle  . Physical activity:    Days per week: Not on file    Minutes per session: Not on file  . Stress: Not on file  Relationships  . Social connections:    Talks on phone: Not on file    Gets together: Not on file    Attends religious service: Not on file    Active member of club or organization: Not on file    Attends meetings of clubs or organizations: Not on file    Relationship status: Not on file  . Intimate partner violence:    Fear of current or ex partner: Not on file    Emotionally abused: Not on file    Physically abused: Not on file    Forced sexual activity: Not on file  Other Topics Concern  . 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 bisacodyl (DULCOLAX) 5 MG EC tablet Take 5 mg by mouth 3 (three) times a week.    . Calcium 600-200 MG-UNIT tablet Take 1 tablet by mouth daily.    . diazepam (VALIUM) 5 MG tablet Take 1 tablet (5 mg total) by mouth every 8 (eight) hours as needed for muscle spasms. 10 tablet 0  . meloxicam (MOBIC) 15 MG tablet Take 1 tablet (15 mg total) daily by mouth. 30 tablet 3  . nystatin (MYCOSTATIN/NYSTOP) powder Apply topically 2 (two) times daily. 60 g 1  . ondansetron (ZOFRAN) 4 MG tablet Take 1 tablet (4 mg total) by mouth every 6 (six) hours. 30 tablet 0  . vitamin B-12 (CYANOCOBALAMIN) 500 MCG tablet Take 500 mcg by mouth daily.    . Vitamin D, Cholecalciferol, 1000 units TABS Take 1 tablet by mouth daily.    . ciprofloxacin (CIPRO) 250 MG tablet Take 1 tablet (250 mg total) by mouth 2 (two) times daily. (Patient not taking: Reported on 04/01/2018) 10 tablet 0   No facility-administered medications prior to visit.     Allergies  Allergen Reactions  . Augmentin [Amoxicillin-Pot Clavulanate] Diarrhea and  Nausea Only  . Morphine And Related Other (See Comments)    DIZZINESS HYPOTENSION HYPOXIA    ROS Review of Systems  Constitutional: Negative.   HENT: Negative.   Eyes: Negative.   Respiratory: Negative.  Cardiovascular: Positive for chest pain. Negative for palpitations and leg swelling.  Gastrointestinal: Negative.  Negative for anal bleeding, blood in stool, nausea and vomiting.  Genitourinary: Negative for hematuria.  Musculoskeletal: Positive for back pain. Negative for myalgias.  Allergic/Immunologic: Negative for immunocompromised state.  Hematological: Does not bruise/bleed easily.  Psychiatric/Behavioral: Negative.       Objective:    Physical Exam  Constitutional: She is oriented to person, place, and time. She appears well-developed and well-nourished. No distress.  HENT:  Head: Normocephalic and atraumatic.  Right Ear: External ear normal.  Left Ear: External ear normal.  Eyes: Conjunctivae are normal. Right eye exhibits no discharge. Left eye exhibits no discharge. No scleral icterus.  Neck: No JVD present. No tracheal deviation present.  Cardiovascular: Normal rate, regular rhythm and normal heart sounds.  Pulmonary/Chest: Effort normal and breath sounds normal. No stridor.      Abdominal: Bowel sounds are normal.  Musculoskeletal:     Thoracic back: She exhibits no tenderness and no bony tenderness.     Lumbar back: She exhibits no tenderness and no bony tenderness.  Neurological: She is alert and oriented to person, place, and time.  Skin: Skin is warm and dry. She is not diaphoretic.  Psychiatric: She has a normal mood and affect. Her behavior is normal.    BP 124/80   Pulse (!) 101   Ht 4\' 10"  (1.473 m)   Wt 147 lb 14.9 oz (67.1 kg)   SpO2 99%   BMI 30.92 kg/m  Wt Readings from Last 3 Encounters:  04/03/18 147 lb 14.9 oz (67.1 kg)  03/31/18 148 lb (67.1 kg)  11/24/17 144 lb 9.6 oz (65.6 kg)   BP Readings from Last 3 Encounters:  04/03/18  124/80  04/01/18 100/64  11/24/17 116/76   Guideline developer:  UpToDate (see UpToDate for funding source) Date Released: June 2014  There are no preventive care reminders to display for this patient.  There are no preventive care reminders to display for this patient.  Lab Results  Component Value Date   TSH 2.85 10/13/2017   Lab Results  Component Value Date   WBC 8.0 04/01/2018   HGB 13.3 04/01/2018   HCT 39.0 04/01/2018   MCV 96.5 04/01/2018   PLT 306 04/01/2018   Lab Results  Component Value Date   NA 141 04/01/2018   K 3.5 04/01/2018   CO2 31 11/24/2017   GLUCOSE 93 04/01/2018   BUN 12 04/01/2018   CREATININE 0.60 04/01/2018   BILITOT 0.5 11/24/2017   ALKPHOS 77 11/24/2017   AST 24 11/24/2017   ALT 17 11/24/2017   PROT 7.3 11/24/2017   ALBUMIN 4.7 11/24/2017   CALCIUM 10.0 11/24/2017   ANIONGAP 9 10/15/2016   GFR 81.05 11/24/2017   Lab Results  Component Value Date   CHOL 201 (H) 10/13/2017   Lab Results  Component Value Date   HDL 76.00 10/13/2017   Lab Results  Component Value Date   LDLCALC 106 (H) 10/13/2017   Lab Results  Component Value Date   TRIG 95.0 10/13/2017   Lab Results  Component Value Date   CHOLHDL 3 10/13/2017   No results found for: HGBA1C    Assessment & Plan:   Problem List Items Addressed This Visit      Musculoskeletal and Integument   Closed fracture of one rib of left side - Primary   Relevant Medications   traMADol (ULTRAM) 50 MG tablet   methocarbamol (ROBAXIN) 500 MG tablet  Elastic Bandages & Supports (RIB BELT/WOMENS MEDIUM) MISC   Other Relevant Orders   DG Ribs Unilateral Left      Meds ordered this encounter  Medications  . traMADol (ULTRAM) 50 MG tablet    Sig: Take 1 tablet (50 mg total) by mouth every 8 (eight) hours as needed.    Dispense:  30 tablet    Refill:  0  . methocarbamol (ROBAXIN) 500 MG tablet    Sig: Take 1 tablet (500 mg total) by mouth 3 (three) times daily.    Dispense:   40 tablet    Refill:  0  . Elastic Bandages & Supports (RIB BELT/WOMENS MEDIUM) MISC    Sig: As directed    Dispense:  1 each    Refill:  0    Follow-up: Return in about 2 weeks (around 04/17/2018).   Patient will discontinue Valium.  She will use ibuprofen as needed in addition to prescribed tramadol on Robaxin.

## 2018-04-03 NOTE — Patient Instructions (Signed)
Rib Fracture    A rib fracture is a break or crack in one of the bones of the ribs. The ribs are long, curved bones that wrap around your chest and attach to your spine and your breastbone. The ribs protect your heart, lungs, and other organs in the chest.  A broken or cracked rib is often painful but is not usually serious. Most rib fractures heal on their own over time. However, rib fractures can be more serious if multiple ribs are broken or if broken ribs move out of place and push against other structures or organs.  What are the causes?  This condition is caused by:   Repetitive movements with high force, such as pitching a baseball or having severe coughing spells.   A direct blow to the chest, such as a sports injury, a car accident, or a fall.   Cancer that has spread to the bones, which can weaken bones and cause them to break.  What are the signs or symptoms?  Symptoms of this condition include:   Pain when you breathe in or cough.   Pain when someone presses on the injured area.   Feeling short of breath.  How is this diagnosed?  This condition is diagnosed with a physical exam and medical history. Imaging tests may also be done, such as:   Chest X-ray.   CT scan.   MRI.   Bone scan.   Chest ultrasound.  How is this treated?  Treatment for this condition depends on the severity of the fracture. Most rib fractures usually heal on their own in 1-3 months. Sometimes healing takes longer if there is a cough that does not stop or if there are other activities that make the injury worse (aggravating factors). While you heal, you will be given medicines to control the pain. You will also be taught deep breathing exercises.  Severe injuries may require hospitalization or surgery.  Follow these instructions at home:  Managing pain, stiffness, and swelling   If directed, apply ice to the injured area.  ? Put ice in a plastic bag.  ? Place a towel between your skin and the bag.  ? Leave the ice on for  20 minutes, 2-3 times a day.   Take over-the-counter and prescription medicines only as told by your health care provider.  Activity   Avoid a lot of activity and any activities or movements that cause pain. Be careful during activities and avoid bumping the injured rib.   Slowly increase your activity as told by your health care provider.  General instructions   Do deep breathing exercises as told by your health care provider. This helps prevent pneumonia, which is a common complication of a broken rib. Your health care provider may instruct you to:  ? Take deep breaths several times a day.  ? Try to cough several times a day, holding a pillow against the injured area.  ? Use a device called incentive spirometer to practice deep breathing several times a day.   Drink enough fluid to keep your urine pale yellow.   Do not wear a rib belt or binder. These restrict breathing, which can lead to pneumonia.   Keep all follow-up visits as told by your health care provider. This is important.  Contact a health care provider if:   You have a fever.  Get help right away if:   You have difficulty breathing or you are short of breath.   You develop a   cough that does not stop, or you cough up thick or bloody sputum.   You have nausea, vomiting, or pain in your abdomen.   Your pain gets worse and medicine does not help.  Summary   A rib fracture is a break or crack in one of the bones of the ribs.   A broken or cracked rib is often painful but is not usually serious.   Most rib fractures heal on their own over time.   Treatment for this condition depends on the severity of the fracture.   Avoid a lot of activity and any activities or movements that cause pain.  This information is not intended to replace advice given to you by your health care provider. Make sure you discuss any questions you have with your health care provider.  Document Released: 03/22/2005 Document Revised: 06/21/2016 Document Reviewed:  06/21/2016  Elsevier Interactive Patient Education  2019 Elsevier Inc[employer]

## 2018-04-06 ENCOUNTER — Encounter: Payer: Self-pay | Admitting: Family Medicine

## 2018-04-13 ENCOUNTER — Encounter: Payer: Self-pay | Admitting: Family Medicine

## 2018-04-14 ENCOUNTER — Other Ambulatory Visit: Payer: Self-pay | Admitting: Family Medicine

## 2018-04-14 ENCOUNTER — Encounter: Payer: Self-pay | Admitting: Family Medicine

## 2018-04-14 DIAGNOSIS — S2232XA Fracture of one rib, left side, initial encounter for closed fracture: Secondary | ICD-10-CM

## 2018-04-14 MED ORDER — TRAMADOL HCL 50 MG PO TABS: 50.0000 mg | ORAL_TABLET | Freq: Three times a day (TID) | ORAL | 0 refills | Status: DC | PRN

## 2018-04-18 ENCOUNTER — Encounter: Payer: Self-pay | Admitting: Family Medicine

## 2018-04-25 ENCOUNTER — Other Ambulatory Visit: Payer: Self-pay | Admitting: Family Medicine

## 2018-04-25 ENCOUNTER — Ambulatory Visit: Payer: Medicare Other | Admitting: Family Medicine

## 2018-04-25 DIAGNOSIS — S2232XA Fracture of one rib, left side, initial encounter for closed fracture: Secondary | ICD-10-CM

## 2018-04-26 ENCOUNTER — Encounter: Payer: Self-pay | Admitting: Family Medicine

## 2018-04-26 DIAGNOSIS — S2232XA Fracture of one rib, left side, initial encounter for closed fracture: Secondary | ICD-10-CM

## 2018-05-18 ENCOUNTER — Ambulatory Visit (INDEPENDENT_AMBULATORY_CARE_PROVIDER_SITE_OTHER): Payer: Medicare Other

## 2018-05-18 ENCOUNTER — Ambulatory Visit (INDEPENDENT_AMBULATORY_CARE_PROVIDER_SITE_OTHER): Payer: Medicare Other | Admitting: Orthopaedic Surgery

## 2018-05-18 ENCOUNTER — Encounter (INDEPENDENT_AMBULATORY_CARE_PROVIDER_SITE_OTHER): Payer: Self-pay | Admitting: Orthopaedic Surgery

## 2018-05-18 VITALS — BP 145/92 | HR 82 | Ht <= 58 in | Wt 148.0 lb

## 2018-05-18 DIAGNOSIS — M545 Low back pain, unspecified: Secondary | ICD-10-CM

## 2018-05-18 DIAGNOSIS — G8929 Other chronic pain: Secondary | ICD-10-CM

## 2018-05-18 IMAGING — CT CT ABD-PELV W/ CM
2 of 5 series · 16 of 46 positions shown, 18 images · IV contrast (ISOVUE 300)
Comparison: Multiple exams, including MRI dated 03/23/2016 and
intraoperative cholangiogram from 04/13/2016

CLINICAL DATA: Right upper quadrant tenderness starting on May 04, 2016. Prior cholecystectomy [DATE]. Nausea.

EXAM:
CT ABDOMEN AND PELVIS WITH CONTRAST
TECHNIQUE: Multidetector CT imaging of the abdomen and pelvis was performed
using the standard protocol following bolus administration of
intravenous contrast.
CONTRAST:  100mL I9OHMD-MCC IOPAMIDOL (I9OHMD-MCC) INJECTION 61%

[Series 2: abd/pel w · axial · 0.70mm/px · z∈[-450,-80]mm · 13 of 84 slices shown, 15 images]
[im 5/84  soft-tissue]
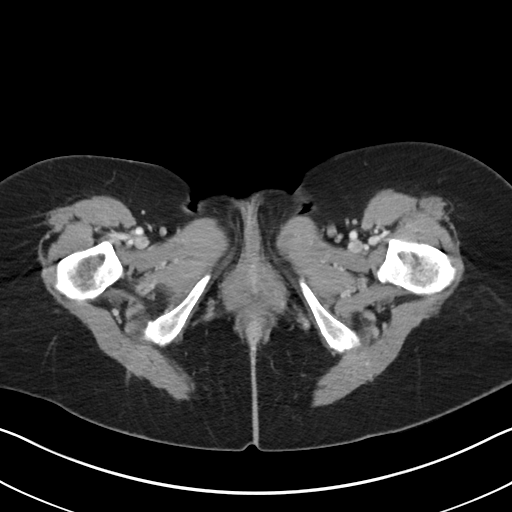
[im 5/84  bone]
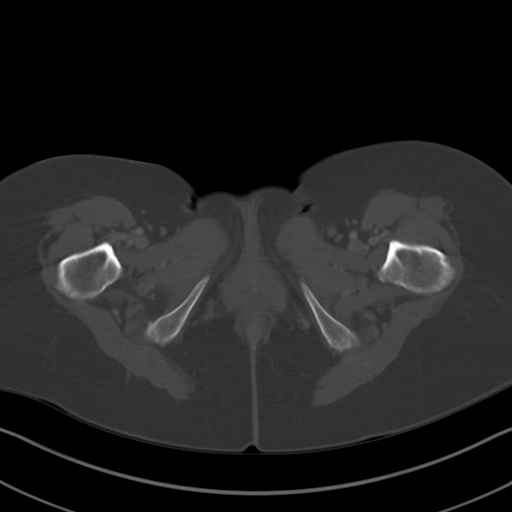
[im 10/84  soft-tissue]
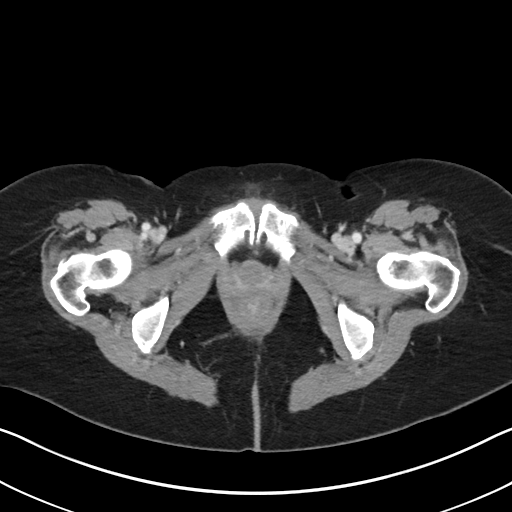
[im 19/84  soft-tissue]
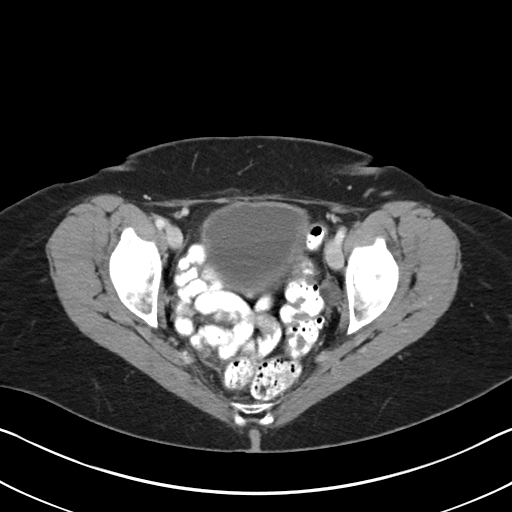
[im 24/84  soft-tissue]
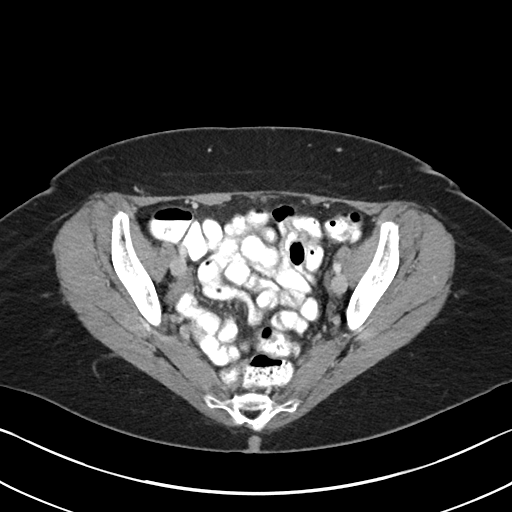
[im 28/84  soft-tissue]
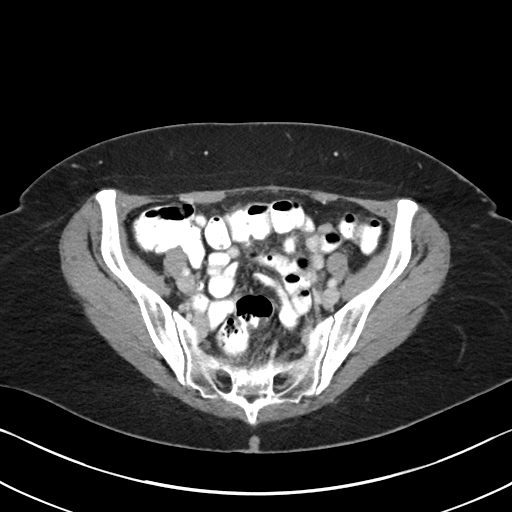
[im 37/84  soft-tissue]
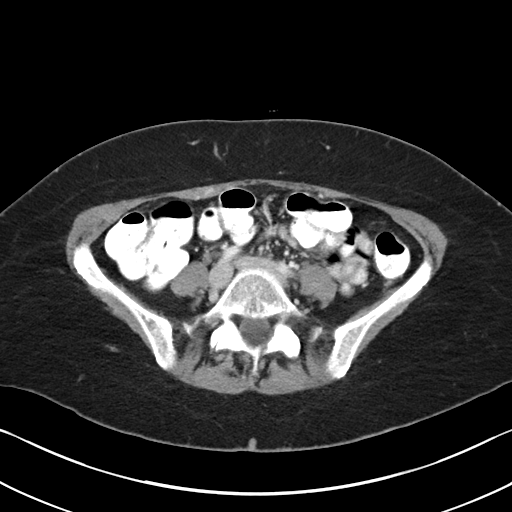
[im 42/84  soft-tissue]
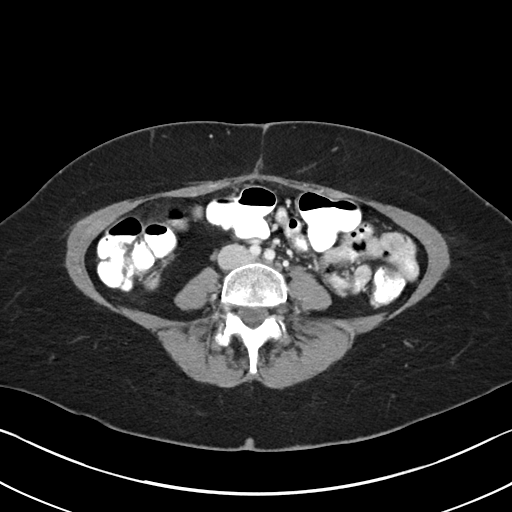
[im 47/84  soft-tissue]
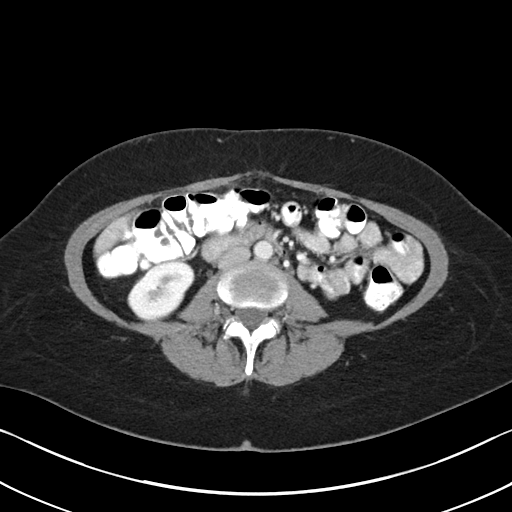
[im 56/84  soft-tissue]
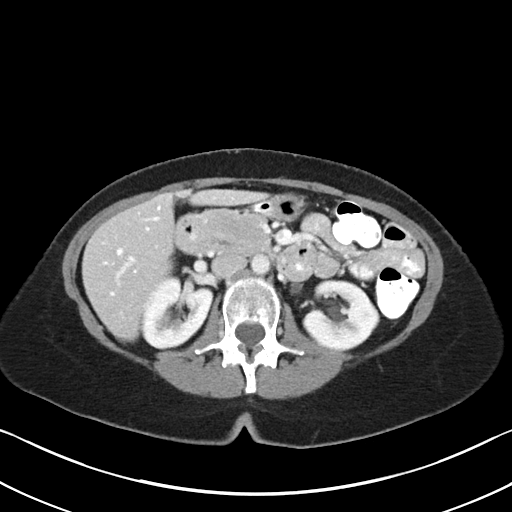
[im 56/84  bone]
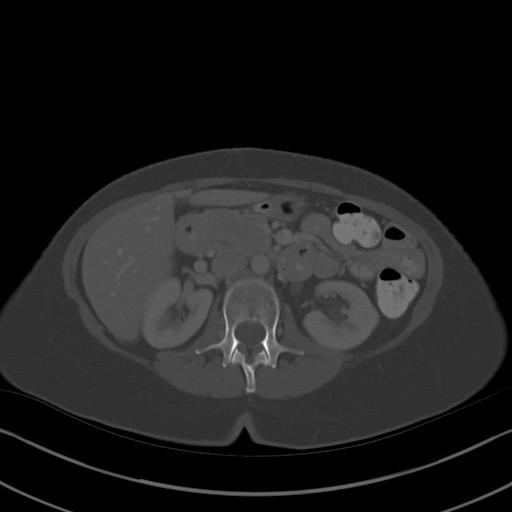
[im 60/84  soft-tissue]
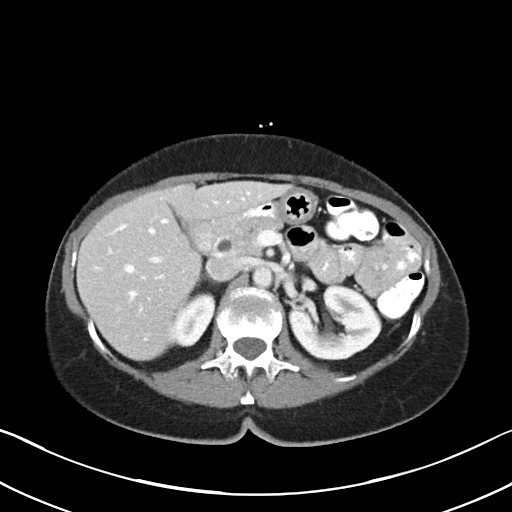
[im 65/84  soft-tissue]
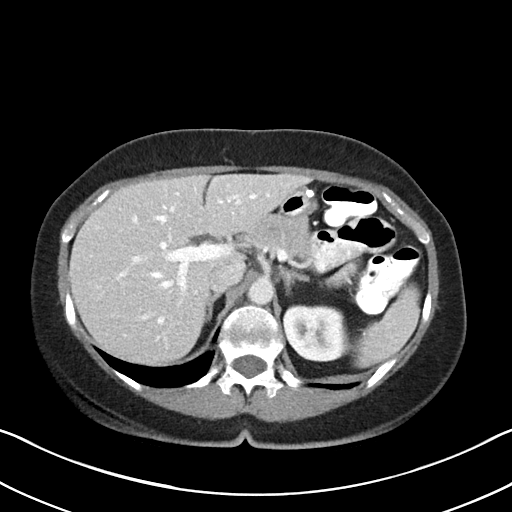
[im 74/84  soft-tissue]
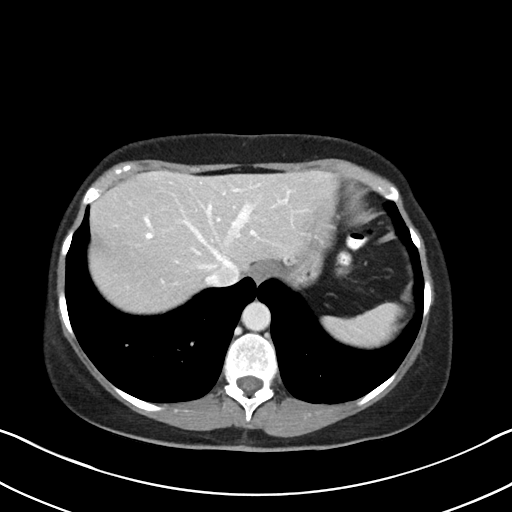
[im 79/84  soft-tissue]
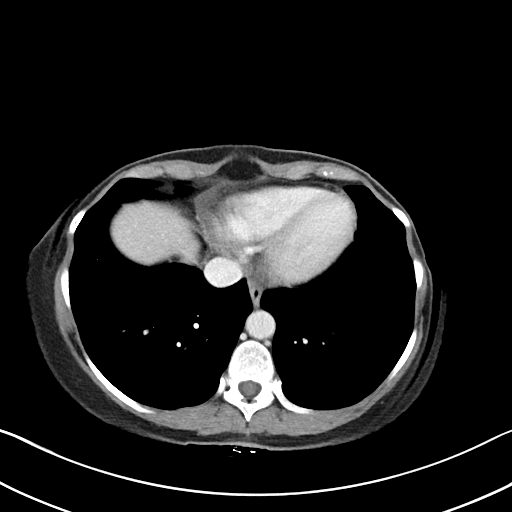

[Series 5: abd/pel w st · coronal · 0.63mm/px · 3 of 66 slices shown]
[im 22/66  soft-tissue]
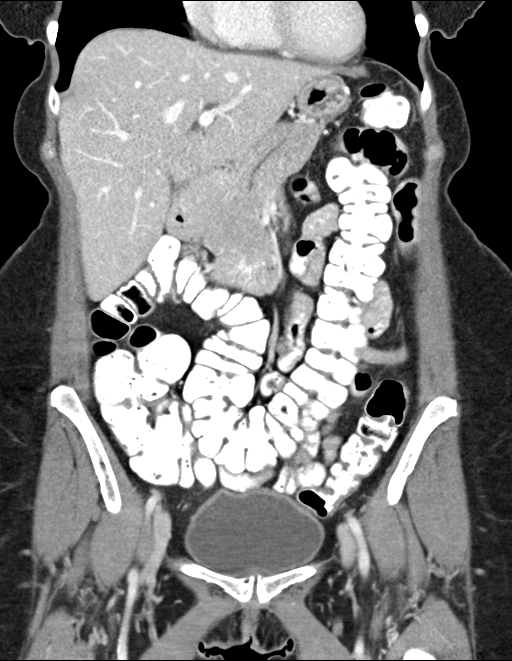
[im 29/66  soft-tissue]
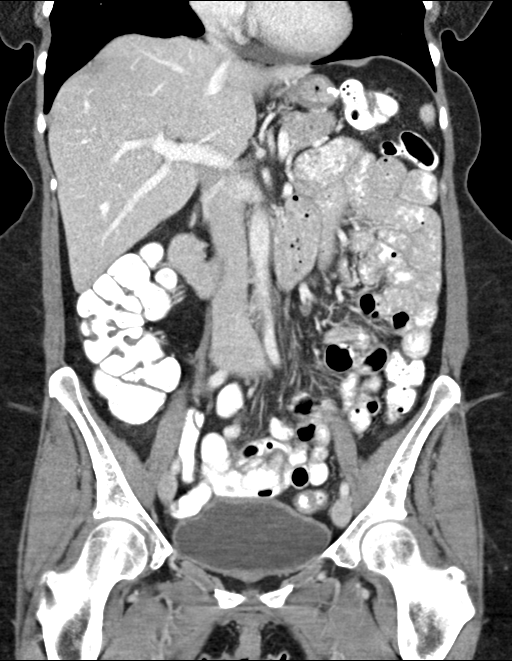
[im 37/66  soft-tissue]
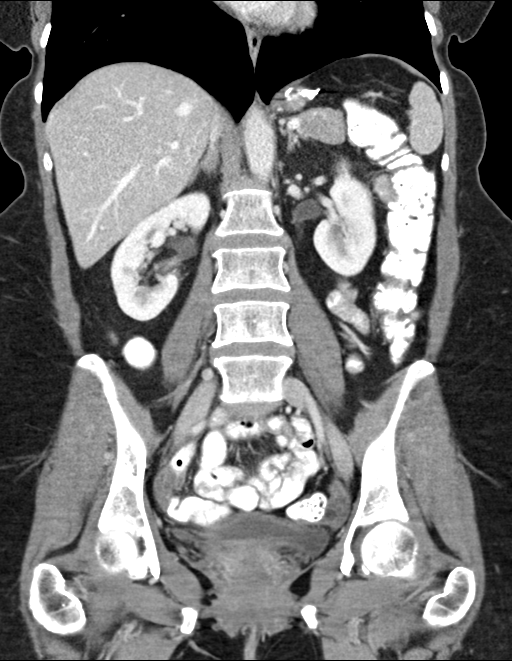

[16 of 46 positions shown; findings below may reference images not displayed]

FINDINGS: Lower chest: Unremarkable

Hepatobiliary: Mildly prominent common bile duct at 8 mm, possibly a
physiologic response to cholecystectomy.

Pancreas: Unremarkable

Spleen: Unremarkable

Adrenals/Urinary Tract: Adrenal glands normal. 4 mm hypodense lesion
medially in the left mid to lower kidney, image [DATE], likely a small
cyst but technically too small to characterize.

Stomach/Bowel: Prior gastric sleeve resection.

Vascular/Lymphatic: Unremarkable

Reproductive: Uterus absent.  Adnexa unremarkable.

Other: No supplemental non-categorized findings. No fluid in the
subhepatic space or along the right paracolic gutter to suggest
biliary leak.

Musculoskeletal: Anterior wedging at T12, likely chronic.
IMPRESSION: 1. A cause for the patient's right upper quadrant abdominal pain is
not identified. There is some minimal biliary dilatation but this is
probably a physiologic response to cholecystectomy. No fluid along
the sub hepatic space or right paracolic gutter to suggest bile
leak. Appendix normal.
2. Prior gastric sleeve resection.
3. Chronic anterior wedging at T12.

## 2018-05-18 NOTE — Progress Notes (Signed)
Office Visit Note   Patient: [name] [name]           Date of Birth: 1950-04-14           MRN: [id number] Visit Date: 05/18/2018              Requested by: Lucille Passy, MD [place], [place] [address] PCP: Lucille Passy, MD   Assessment & Plan: Visit Diagnoses:  1. Chronic right-sided low back pain without sciatica     Plan: I suspect [name] [name] might have a healing ninth left rib fracture but also a contusion of the para lumbar musculature on the left side.  She is significantly better over the last several weeks and at least "85% improved".  No numbness or tingling referred pain to either lower extremity.  I think should continue to improve and can perform activity to tolerance.  I would like to see her back in the next 3 to 4 weeks if she is having any problem but I think she will have resolution of her discomfort next few weeks  Follow-Up Instructions: Return if symptoms worsen or fail to improve.   Orders:  Orders Placed This Encounter  Procedures  . XR Lumbar Spine 2-3 Views   No orders of the defined types were placed in this encounter.     Procedures: No procedures performed   Clinical Data: No additional findings.   Subjective: Patient presents today with right sided back pain. She said that she fell 6 weeks ago. She went to [facility] ER and had x-rays taken. She said that she continues to have pain. She is taking Naproxen and Robaxin as needed. No weakness, numbness, or tingling. She said that when she stands it take her a few moments to get her legs to move.  She has a history of a compression fracture at T12 Films were reviewed of her ribs.  No evidence of a fracture.  Presently is feeling at least "85% better with improvement weekly.  She does have some mild discomfort just to the left of the midline near the lumbosacral junction.  She denies any bowel or bladder changes.  Not having any nausea or abdominal complaints.  No shortness of breath  or chest pain no related numbness or tingling to either lower extremity.  She does not have any pain when she coughs or sneezes  HPI  Review of Systems   Objective: Vital Signs: BP (!) 145/92   Pulse 82   Ht 4\' 10"  (1.473 m)   Wt 148 lb (67.1 kg)   BMI 30.93 kg/m   Physical Exam Constitutional:      Appearance: She is well-developed.  Eyes:     Pupils: Pupils are equal, round, and reactive to light.  Pulmonary:     Effort: Pulmonary effort is normal.  Skin:    General: Skin is warm and dry.  Neurological:     Mental Status: She is alert and oriented to person, place, and time.  Psychiatric:        Behavior: Behavior normal.     Ortho Exam awake alert and oriented x3.  Comfortable sitting.  Does have some mild tenderness to palpation para lumbosacral musculature.  No masses.  No bruising.  Straight leg raise negative.  Painless range of motion both hips.  No percussible tenderness of the thoracic or lumbar spine in the midline.  Little bit of tenderness in the lower left rib cage.  No flank pain  Specialty Comments:  No specialty comments available.  Imaging: Xr Lumbar Spine 2-3 Views  Result Date: 05/18/2018 Films of the lumbar spine were obtained in 2 projections.  I do not see any evidence of impression fracture.  There might be a small healing fracture of the ninth rib.  No curvature.  No listhesis.  Decreased bone density    PMFS History: Patient Active Problem List   Diagnosis Date Noted  . Closed fracture of one rib of left side 04/03/2018  . Well woman exam with routine gynecological exam 10/13/2017  . Abdominal pain 10/18/2016  . Atypical lymphocytes present on peripheral blood smear 06/02/2016  . Family history of colon cancer-brother at age 77 05/27/2016  . Conductive hearing loss of right ear 05/04/2016  . S/P laparoscopic sleeve gastrectomy 12/18/2013  . Persistent disorder of initiating or maintaining sleep 11/05/2013  . CORONARY ARTERY SPASM  10/17/2009  . Disorder of bone and cartilage 10/17/2009   Past Medical History:  Diagnosis Date  . Allergy   . Anemia    IN THE PAST WITH PREGNANCY  . Arthritis    LEFT FOOT; OCCAS LOWER BACK PAIN - HX OF VERTEBRAL FRACTURES  . Blood transfusion without reported diagnosis    DURING PREGNANCY  . Complication of anesthesia    a little sluggist when waking up-no intervention required  . Coronary artery spasm (HCC) 1990'S   AFTER OTC DIET PILLS - PT EXPERIENCED CHEST PAIN- PT HAD CARDIAC CATH - TOLD NO CAD - THOUGHT TO HAVE HAS CORNARY ARTERY SPASM  . H/O seasonal allergies   . Headache(784.0)    OCCAS SINUS HEADACHES  . Hearing impaired    LEFT EAR DEAFNESS  . Hypertension    220/104 IN SPRING 2015 - SAW HER DOCTOR - BUT B/P NORMALIZED - PT DECIDED TO STOP CAFFEINE AND HAS NOT HAD ANY ELEVATED PRESSURES SINCE JUNE  . Osteopenia   . Strabismus    HAD STRABISMUS SURGERY 1956 - BUT STILL HAS STRABISMUS  . Vitamin D deficiency     Family History  Problem Relation Age of Onset  . Hypertension Mother   . Diabetes Mother   . Obesity Mother   . Heart disease Mother   . Alcohol abuse Father   . COPD Other   . Hyperlipidemia Other   . Colon cancer Brother 88  . Stomach cancer Neg Hx   . Rectal cancer Neg Hx   . Esophageal cancer Neg Hx   . Pancreatic cancer Neg Hx     Past Surgical History:  Procedure Laterality Date  . ABDOMINAL HYSTERECTOMY    . APPENDECTOMY    . BREATH TEK H PYLORI N/A 10/08/2013   Procedure: BREATH TEK H PYLORI;  Surgeon: Gayland Curry, MD;  Location: [facility];  Service: General;  Laterality: N/A;  . BREATH TEK H PYLORI N/A 12/03/2013   Procedure: BREATH TEK Kandis Ban;  Surgeon: Gayland Curry, MD;  Location: [facility];  Service: General;  Laterality: N/A;  . CHOLECYSTECTOMY N/A 04/13/2016   Procedure: LAPAROSCOPIC CHOLECYSTECTOMY WITH ATTEMPTED INTRAOPERATIVE CHOLANGIOGRAM;  Surgeon: Greer Pickerel, MD;  Location: [facility];  Service: General;  Laterality: N/A;    . COLONOSCOPY    . EYE SURGERY Bilateral 2017   cataract surgery with lens implant  . INNER EAR SURGERY Left 1965   RADICAL MASTOIDECTOMY  . LAPAROSCOPIC GASTRIC SLEEVE RESECTION N/A 12/18/2013   Procedure: LAPAROSCOPIC GASTRIC SLEEVE RESECTION;  Surgeon: Greer Pickerel, MD;  Location: [facility];  Service: General;  Laterality: N/A;  . REPAIR TENDONS FOOT Right 1994   AND RECONSTRUCTIVE SURGERY FOR MULTIPLE FRACTURES OF THE RIGHT FOOT  . STOMACH SURGERY    . strabismus repair    . TONSILLECTOMY     Social History   Occupational History  . Occupation: Programmer, multimedia: [employer]  Tobacco Use  . Smoking status: Never Smoker  . Smokeless tobacco: Never Used  Substance and Sexual Activity  . Alcohol use: No    Alcohol/week: 0.0 standard drinks  . Drug use: No  . Sexual activity: Not on file

## 2018-05-19 ENCOUNTER — Encounter: Payer: Self-pay | Admitting: Family Medicine

## 2018-06-06 ENCOUNTER — Encounter: Payer: Self-pay | Admitting: Family Medicine

## 2018-06-12 NOTE — Progress Notes (Signed)
Subjective:   [name] [name] is a 68 y.o. female who presents for an Initial Medicare Annual Wellness Visit.  Just established a nonprofit. Retired Therapist, sports.  The Patient was informed that the wellness visit is to identify future health risk and educate and initiate measures that can reduce risk for increased disease through the lifespan.   Describes health as fair, good or great? "excellent"  Review of Systems   No ROS.  Medicare Wellness Visit. Additional risk factors are reflected in the social history. Cardiac Risk Factors include: advanced age (>3men, >48 women);hypertension Sleep patterns:  Sleeps well. Takes Tylenol PM nightly.  Home Safety/Smoke Alarms: Feels safe in home. Smoke alarms in place.  Lives with husband in 1 story home. 1 small dog and 2 cats.Youngest son just moved back home. Walk in shower with grab rail.    Female:   Pap-  10/13/17     Mammo-  utd     Dexa scan-  utd      CCS- 08/09/16. 3 yr recall     Objective:    Today's Vitals   06/14/18 1433  BP: 122/78  Pulse: 82  SpO2: 98%  Weight: 152 lb 6.4 oz (69.1 kg)  Height: 4\' 10"  (1.473 m)   Body mass index is 31.85 kg/m.  Advanced Directives 06/14/2018 10/15/2016 08/02/2016 06/24/2016 06/17/2016 04/09/2016 12/18/2013  Does Patient Have a Medical Advance Directive? Yes No Yes No No Yes Yes  Type of Paramedic of [place];Living will - Living will - - [place];Living will [place];Living will  Does patient want to make changes to medical advance directive? No - Patient declined - - - - No - Patient declined No - Patient declined  Copy of [place] in Chart? No - copy requested - - - - No - copy requested No - copy requested    Current Medications (verified) Outpatient Encounter Medications as of 06/14/2018  Medication Sig  . bisacodyl (DULCOLAX) 5 MG EC tablet Take 5 mg by mouth 3 (three) times a week.  . Calcium 600-200 MG-UNIT  tablet Take 1 tablet by mouth daily.  Marland Kitchen docusate sodium (COLACE) 100 MG capsule Take 100 mg by mouth daily.  . meloxicam (MOBIC) 15 MG tablet Take 1 tablet (15 mg total) daily by mouth.  . nystatin (MYCOSTATIN/NYSTOP) powder Apply topically 2 (two) times daily.  . ondansetron (ZOFRAN) 4 MG tablet Take 1 tablet (4 mg total) by mouth every 6 (six) hours.  . vitamin B-12 (CYANOCOBALAMIN) 500 MCG tablet Take 500 mcg by mouth daily.  . Vitamin D, Cholecalciferol, 1000 units TABS Take 1 tablet by mouth daily.  . [DISCONTINUED] Elastic Bandages & Supports (RIB BELT/WOMENS MEDIUM) MISC As directed  . [DISCONTINUED] methocarbamol (ROBAXIN) 500 MG tablet TAKE 1 TABLET BY MOUTH THREE TIMES A DAY  . [DISCONTINUED] traMADol (ULTRAM) 50 MG tablet Take 1 tablet (50 mg total) by mouth every 8 (eight) hours as needed.   No facility-administered encounter medications on file as of 06/14/2018.     Allergies (verified) Augmentin [amoxicillin-pot clavulanate] and Morphine and related   History: Past Medical History:  Diagnosis Date  . Allergy   . Anemia    IN THE PAST WITH PREGNANCY  . Arthritis    LEFT FOOT; OCCAS LOWER BACK PAIN - HX OF VERTEBRAL FRACTURES  . Blood transfusion without reported diagnosis    DURING PREGNANCY  . Complication of anesthesia    a little sluggist when  waking up-no intervention required  . Coronary artery spasm (HCC) 1990'S   AFTER OTC DIET PILLS - PT EXPERIENCED CHEST PAIN- PT HAD CARDIAC CATH - TOLD NO CAD - THOUGHT TO HAVE HAS CORNARY ARTERY SPASM  . H/O seasonal allergies   . Headache(784.0)    OCCAS SINUS HEADACHES  . Hearing impaired    LEFT EAR DEAFNESS  . Hypertension    220/104 IN SPRING 2015 - SAW HER DOCTOR - BUT B/P NORMALIZED - PT DECIDED TO STOP CAFFEINE AND HAS NOT HAD ANY ELEVATED PRESSURES SINCE JUNE  . Osteopenia   . Strabismus    HAD STRABISMUS SURGERY 1956 - BUT STILL HAS STRABISMUS  . Vitamin D deficiency    Past Surgical History:  Procedure  Laterality Date  . ABDOMINAL HYSTERECTOMY    . APPENDECTOMY    . BREATH TEK H PYLORI N/A 10/08/2013   Procedure: BREATH TEK H PYLORI;  Surgeon: Gayland Curry, MD;  Location: [facility];  Service: General;  Laterality: N/A;  . BREATH TEK H PYLORI N/A 12/03/2013   Procedure: BREATH TEK Kandis Ban;  Surgeon: Gayland Curry, MD;  Location: [facility];  Service: General;  Laterality: N/A;  . CHOLECYSTECTOMY N/A 04/13/2016   Procedure: LAPAROSCOPIC CHOLECYSTECTOMY WITH ATTEMPTED INTRAOPERATIVE CHOLANGIOGRAM;  Surgeon: Greer Pickerel, MD;  Location: [facility];  Service: General;  Laterality: N/A;  . COLONOSCOPY    . EYE SURGERY Bilateral 2017   cataract surgery with lens implant  . INNER EAR SURGERY Left 1965   RADICAL MASTOIDECTOMY  . LAPAROSCOPIC GASTRIC SLEEVE RESECTION N/A 12/18/2013   Procedure: LAPAROSCOPIC GASTRIC SLEEVE RESECTION;  Surgeon: Greer Pickerel, MD;  Location: [facility];  Service: General;  Laterality: N/A;  . REPAIR TENDONS FOOT Right 1994   AND RECONSTRUCTIVE SURGERY FOR MULTIPLE FRACTURES OF THE RIGHT FOOT  . STOMACH SURGERY    . strabismus repair    . TONSILLECTOMY     Family History  Problem Relation Age of Onset  . Hypertension Mother   . Diabetes Mother   . Obesity Mother   . Heart disease Mother   . Alcohol abuse Father   . COPD Other   . Hyperlipidemia Other   . Colon cancer Brother 44  . Stomach cancer Neg Hx   . Rectal cancer Neg Hx   . Esophageal cancer Neg Hx   . Pancreatic cancer Neg Hx    Social History   Socioeconomic History  . Marital status: Married    Spouse name: Not on file  . Number of children: 2  . Years of education: Not on file  . Highest education level: Not on file  Occupational History  . Occupation: Programmer, multimedia: [employer]  . Financial resource strain: Not on file  . Food insecurity:    Worry: Not on file    Inability: Not on file  . Transportation needs:    Medical: Not on file    Non-medical: Not on file  Tobacco Use   . Smoking status: Never Smoker  . Smokeless tobacco: Never Used  Substance and Sexual Activity  . Alcohol use: No    Alcohol/week: 0.0 standard drinks  . Drug use: No  . Sexual activity: Not on file  Lifestyle  . Physical activity:    Days per week: Not on file    Minutes per session: Not on file  . Stress: Not on file  Relationships  . Social connections:    Talks on phone: Not  on file    Gets together: Not on file    Attends religious service: Not on file    Active member of club or organization: Not on file    Attends meetings of clubs or organizations: Not on file    Relationship status: Not on file  Other Topics Concern  . 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 N N  Comment deaf in left ear. Dr.Rosen every 6 months. -  Vision? N N  Comment wearing glasses. hx cataract sx. Dr.Roberts every 2 yrs.  -  Difficulty concentrating or making decisions? N N  Walking or climbing stairs? N N  Dressing or bathing? N N  Doing errands, shopping? N N  Preparing Food and eating ? N -  Using the Toilet? N -  In the past six months, have you accidently leaked urine? N -  Do you have problems with loss of bowel control? N -  Managing your Medications? N -  Managing your Finances? N -  Housekeeping or managing your Housekeeping? N -  Some recent data might be hidden     Immunizations and Health Maintenance Immunization History  Administered Date(s) Administered  . Influenza Split 02/03/2013  . Influenza,inj,Quad PF,6+ Mos 12/18/2014  . Influenza-Unspecified 12/05/2015, 12/21/2016  .  Pneumococcal Conjugate-13 10/13/2017  . Td 10/23/2008   There are no preventive care reminders to display for this patient.  Patient Care Team: Lucille Passy, MD as PCP - General  Indicate any recent Medical Services you may have received from other than [facility] providers in the past year (date may be approximate).     Assessment:   This is a routine wellness examination for [place]. Physical assessment deferred to PCP.  Hearing/Vision screen  Hearing Screening   125Hz  250Hz  500Hz  1000Hz  2000Hz  3000Hz  4000Hz  6000Hz  8000Hz   Right ear:   Pass Pass Fail  Fail    Left ear:           Comments: Deaf in Left ear.   Visual Acuity Screening   Right eye Left eye Both eyes  Without correction:     With correction: 20/40 20/40 20/40     Dietary issues and exercise activities discussed: Current Exercise Habits: Home exercise routine, Type of exercise: treadmill, Time (Minutes): 20, Frequency (Times/Week): 2, Weekly Exercise (Minutes/Week): 40, Intensity: Mild, Exercise limited by: None identified Diet (meal preparation, eat out, water intake, caffeinated beverages, dairy products, fruits and vegetables): eats 3 meals and 2 small snacks per day.   Goals    . DIET - INCREASE WATER INTAKE    . Exercise 150 min/wk Moderate Activity      Depression Screen PHQ 2/9 Scores 06/14/2018 08/16/2017 12/18/2014  PHQ - 2 Score 0 0 0    Fall Risk Fall Risk  06/14/2018 08/16/2017  Falls in the past year? 1 No  Number falls in past yr: 1 -  Injury with Fall? 1 -  Risk for fall due to : Impaired balance/gait -  Follow up Education provided;Falls prevention discussed -   Cognitive Function: MMSE - Mini Mental State Exam 06/14/2018  Orientation to  time 5  Orientation to Place 5  Registration 3  Attention/ Calculation 5  Recall 3  Language- name 2 objects 2  Language- repeat 1  Language- follow 3 step command 3  Language- read & follow direction 1  Write a sentence 1  Copy design 1  Total score 30         Screening Tests Health Maintenance  Topic Date Due  . MAMMOGRAM  06/29/2018  . PNA vac Low Risk Adult (2 of 2 - PPSV23) 10/14/2018  . TETANUS/TDAP  10/24/2018  . COLONOSCOPY  08/10/2019  . INFLUENZA VACCINE  Completed  . DEXA SCAN  Completed  . Hepatitis C Screening  Completed     Plan:    Please schedule your next medicare wellness visit with me in 1 yr.  Continue to eat heart healthy diet (full of fruits, vegetables, whole grains, lean protein, water--limit salt, fat, and sugar intake) and increase physical activity as tolerated.  Bring a copy of your living will and/or healthcare power of attorney to your next office visit.    I have personally reviewed and noted the following in the patient's chart:   . Medical and social history . Use of alcohol, tobacco or illicit drugs  . Current medications and supplements . Functional ability and status . Nutritional status . Physical activity . Advanced directives . List of other physicians . Hospitalizations, surgeries, and ER visits in previous 12 months . Vitals . Screenings to include cognitive, depression, and falls . Referrals and appointments  In addition, I have reviewed and discussed with patient certain preventive protocols, quality metrics, and best practice recommendations. A written personalized care plan for preventive services as well as general preventive health recommendations were provided to patient.     Shela Nevin, [place]   06/14/2018

## 2018-06-14 ENCOUNTER — Ambulatory Visit (INDEPENDENT_AMBULATORY_CARE_PROVIDER_SITE_OTHER): Payer: Medicare Other | Admitting: *Deleted

## 2018-06-14 ENCOUNTER — Encounter: Payer: Self-pay | Admitting: *Deleted

## 2018-06-14 ENCOUNTER — Encounter: Payer: Self-pay | Admitting: Family Medicine

## 2018-06-14 ENCOUNTER — Other Ambulatory Visit: Payer: Self-pay

## 2018-06-14 VITALS — BP 122/78 | HR 82 | Ht <= 58 in | Wt 152.4 lb

## 2018-06-14 DIAGNOSIS — Z0000 Encounter for general adult medical examination without abnormal findings: Secondary | ICD-10-CM

## 2018-06-14 DIAGNOSIS — M810 Age-related osteoporosis without current pathological fracture: Secondary | ICD-10-CM | POA: Insufficient documentation

## 2018-06-14 NOTE — Progress Notes (Signed)
AWV reviewed and agree with assessment and recommendations

## 2018-06-14 NOTE — Patient Instructions (Signed)
Please schedule your next medicare wellness visit with me in 1 yr.  Continue to eat heart healthy diet (full of fruits, vegetables, whole grains, lean protein, water--limit salt, fat, and sugar intake) and increase physical activity as tolerated.  Bring a copy of your living will and/or healthcare power of attorney to your next office visit.    [name] [name] , Thank you for taking time to come for your Medicare Wellness Visit. I appreciate your ongoing commitment to your health goals. Please review the following plan we discussed and let me know if I can assist you in the future.   These are the goals we discussed: Goals    . DIET - INCREASE WATER INTAKE    . Exercise 150 min/wk Moderate Activity       This is a list of the screening recommended for you and due dates:  Health Maintenance  Topic Date Due  . Mammogram  06/29/2018  . Pneumonia vaccines (2 of 2 - PPSV23) 10/14/2018  . Tetanus Vaccine  10/24/2018  . Colon Cancer Screening  08/10/2019  . Flu Shot  Completed  . DEXA scan (bone density measurement)  Completed  .  Hepatitis C: One time screening is recommended by Center for Disease Control  (CDC) for  adults born from [birth year] through 1963.   Completed    Health Maintenance After Age 23 After age 49, you are at a higher risk for certain long-term diseases and infections as well as injuries from falls. Falls are a major cause of broken bones and head injuries in people who are older than age 77. Getting regular preventive care can help to keep you healthy and well. Preventive care includes getting regular testing and making lifestyle changes as recommended by your health care provider. Talk with your health care provider about:  Which screenings and tests you should have. A screening is a test that checks for a disease when you have no symptoms.  A diet and exercise plan that is right for you. What should I know about screenings and tests to prevent falls? Screening and testing  are the best ways to find a health problem early. Early diagnosis and treatment give you the best chance of managing medical conditions that are common after age 13. Certain conditions and lifestyle choices may make you more likely to have a fall. Your health care provider may recommend:  Regular vision checks. Poor vision and conditions such as cataracts can make you more likely to have a fall. If you wear glasses, make sure to get your prescription updated if your vision changes.  Medicine review. Work with your health care provider to regularly review all of the medicines you are taking, including over-the-counter medicines. Ask your health care provider about any side effects that may make you more likely to have a fall. Tell your health care provider if any medicines that you take make you feel dizzy or sleepy.  Osteoporosis screening. Osteoporosis is a condition that causes the bones to get weaker. This can make the bones weak and cause them to break more easily.  Blood pressure screening. Blood pressure changes and medicines to control blood pressure can make you feel dizzy.  Strength and balance checks. Your health care provider may recommend certain tests to check your strength and balance while standing, walking, or changing positions.  Foot health exam. Foot pain and numbness, as well as not wearing proper footwear, can make you more likely to have a fall.  Depression screening. You  may be more likely to have a fall if you have a fear of falling, feel emotionally low, or feel unable to do activities that you used to do.  Alcohol use screening. Using too much alcohol can affect your balance and may make you more likely to have a fall. What actions can I take to lower my risk of falls? General instructions  Talk with your health care provider about your risks for falling. Tell your health care provider if: ? You fall. Be sure to tell your health care provider about all falls, even ones  that seem minor. ? You feel dizzy, sleepy, or off-balance.  Take over-the-counter and prescription medicines only as told by your health care provider. These include any supplements.  Eat a healthy diet and maintain a healthy weight. A healthy diet includes low-fat dairy products, low-fat (lean) meats, and fiber from whole grains, beans, and lots of fruits and vegetables. Home safety  Remove any tripping hazards, such as rugs, cords, and clutter.  Install safety equipment such as grab bars in bathrooms and safety rails on stairs.  Keep rooms and walkways well-lit. Activity   Follow a regular exercise program to stay fit. This will help you maintain your balance. Ask your health care provider what types of exercise are appropriate for you.  If you need a cane or walker, use it as recommended by your health care provider.  Wear supportive shoes that have nonskid soles. Lifestyle  Do not drink alcohol if your health care provider tells you not to drink.  If you drink alcohol, limit how much you have: ? 0-1 drink a day for women. ? 0-2 drinks a day for men.  Be aware of how much alcohol is in your drink. In the [place] one drink equals one typical bottle of beer (12 oz), one-half glass of wine (5 oz), or one shot of hard liquor (1 oz).  Do not use any products that contain nicotine or tobacco, such as cigarettes and e-cigarettes. If you need help quitting, ask your health care provider. Summary  Having a healthy lifestyle and getting preventive care can help to protect your health and wellness after age 47.  Screening and testing are the best way to find a health problem early and help you avoid having a fall. Early diagnosis and treatment give you the best chance for managing medical conditions that are more common for people who are older than age 24.  Falls are a major cause of broken bones and head injuries in people who are older than age 37. Take precautions to prevent a fall  at home.  Work with your health care provider to learn what changes you can make to improve your health and wellness and to prevent falls. This information is not intended to replace advice given to you by your health care provider. Make sure you discuss any questions you have with your health care provider. Document Released: 02/02/2017 Document Revised: 02/02/2017 Document Reviewed: 02/02/2017 Elsevier Interactive Patient Education  2019 [employer].

## 2018-06-16 ENCOUNTER — Telehealth: Payer: Self-pay

## 2018-06-16 NOTE — Telephone Encounter (Signed)
RB-Pt wants to start Prolia injections/Dr. Deborra Medina is in agreement/Not sure what the process is for this/plz advise/thx dmf

## 2018-06-23 NOTE — Telephone Encounter (Signed)
Dr. Deborra Medina - does patient have a diagnosis of osteoporosis or osteopenia only? Patient must have a diagnosis of osteoporosis for Prolia injection to be covered by the insurance. Thanks.

## 2018-06-23 NOTE — Telephone Encounter (Signed)
She should qualify as having osteoporosis given her pathological fractures, from what I understand.  But please verify this for me.  Thanks!

## 2018-08-01 ENCOUNTER — Other Ambulatory Visit: Payer: Self-pay | Admitting: Family Medicine

## 2018-08-01 DIAGNOSIS — Z1231 Encounter for screening mammogram for malignant neoplasm of breast: Secondary | ICD-10-CM

## 2018-08-24 ENCOUNTER — Other Ambulatory Visit: Payer: Self-pay

## 2018-08-24 ENCOUNTER — Encounter: Payer: Self-pay | Admitting: Family Medicine

## 2018-08-24 MED ORDER — MELOXICAM 15 MG PO TABS: 15.0000 mg | ORAL_TABLET | Freq: Every day | ORAL | 0 refills | Status: DC

## 2018-08-29 DIAGNOSIS — E559 Vitamin D deficiency, unspecified: Secondary | ICD-10-CM | POA: Insufficient documentation

## 2018-08-29 DIAGNOSIS — M791 Myalgia, unspecified site: Secondary | ICD-10-CM | POA: Insufficient documentation

## 2018-08-29 NOTE — Progress Notes (Signed)
Virtual Visit via Video   Due to the COVID-19 pandemic, this visit was completed with telemedicine (audio/video) technology to reduce patient and provider exposure as well as to preserve personal protective equipment.   I connected with [name] by a video enabled telemedicine application and verified that I am speaking with the correct person using two identifiers. Location patient: Home Location provider: [facility], Office Persons participating in the virtual visit: Lajuan Lines, MD   I discussed the limitations of evaluation and management by telemedicine and the availability of in person appointments. The patient expressed understanding and agreed to proceed.  Care Team   Patient Care Team: Lucille Passy, MD as PCP - General  Subjective:   HPI:  Very pleasant female with h/o of OA , Vitamin D deficiency and osteoporosis with the following complaint:  "I am having more arthritis flare days that limits my activity. Besides the chronic flares of my feet, I am now experiencing pain at the recent fx site (back) and my hands. On these days I take the meloxicam and the discomfort goes away."   She feels it is worse because it has been so cold.  A refill was sent in for this to last until this appointment.   On 7.11.19 her Vit-D was 26.06.   History of osteoporosis.  Was on prolia at one point. Last DEXA was on 10/21/17.  Has tripped and falling several times over the years and has fractured her foot, rib, and wrist.  She had agree to restart Prolia just prior to [place] started.    Review of Systems  Constitutional: Negative.   HENT: Negative.   Eyes: Negative.   Respiratory: Negative.   Cardiovascular: Negative.   Gastrointestinal: Negative.   Genitourinary: Negative.   Musculoskeletal: Positive for back pain and joint pain.  Skin: Negative.   Neurological: Negative.   Endo/Heme/Allergies: Negative.   Psychiatric/Behavioral: Negative.   All  other systems reviewed and are negative.    Patient Active Problem List   Diagnosis Date Noted  . Myalgia 08/29/2018  . Vitamin D deficiency 08/29/2018  . Osteoporosis 06/14/2018  . Closed fracture of one rib of left side 04/03/2018  . Well woman exam with routine gynecological exam 10/13/2017  . Abdominal pain 10/18/2016  . Atypical lymphocytes present on peripheral blood smear 06/02/2016  . Family history of colon cancer-brother at age 59 05/27/2016  . Conductive hearing loss of right ear 05/04/2016  . S/P laparoscopic sleeve gastrectomy 12/18/2013  . Persistent disorder of initiating or maintaining sleep 11/05/2013  . CORONARY ARTERY SPASM 10/17/2009  . Disorder of bone and cartilage 10/17/2009    Social History   Tobacco Use  . Smoking status: Never Smoker  . Smokeless tobacco: Never Used  Substance Use Topics  . Alcohol use: No    Alcohol/week: 0.0 standard drinks    Current Outpatient Medications:  .  bisacodyl (DULCOLAX) 5 MG EC tablet, Take 5 mg by mouth 3 (three) times a week., Disp: , Rfl:  .  Calcium 600-200 MG-UNIT tablet, Take 1 tablet by mouth daily., Disp: , Rfl:  .  docusate sodium (COLACE) 100 MG capsule, Take 100 mg by mouth daily., Disp: , Rfl:  .  meloxicam (MOBIC) 15 MG tablet, Take 1 tablet (15 mg total) by mouth daily., Disp: 90 tablet, Rfl: 0 .  nystatin (MYCOSTATIN/NYSTOP) powder, Apply topically 2 (two) times daily., Disp: 60 g, Rfl: 1 .  ondansetron (ZOFRAN) 4 MG tablet, Take 1  tablet (4 mg total) by mouth every 6 (six) hours., Disp: 30 tablet, Rfl: 0 .  Sennosides-Docusate Sodium (PERI-COLACE PO), Take by mouth., Disp: , Rfl:  .  vitamin B-12 (CYANOCOBALAMIN) 500 MCG tablet, Take 500 mcg by mouth daily., Disp: , Rfl:  .  Vitamin D, Cholecalciferol, 1000 units TABS, Take 1 tablet by mouth daily., Disp: , Rfl:   Allergies  Allergen Reactions  . Augmentin [Amoxicillin-Pot Clavulanate] Diarrhea and Nausea Only  . Morphine And Related Other (See  Comments)    DIZZINESS HYPOTENSION HYPOXIA    Objective:  BP (!) 132/98 Comment: pt reprt  Pulse 80 Comment: pt report  Temp 97.9 F (36.6 C) (Oral) Comment: pt report  Ht '4\' 10"'$  (1.473 m)   Wt 153 lb (69.4 kg) Comment: pt report  SpO2 97% Comment: pt report  BMI 31.98 kg/m   VITALS: Per patient if applicable, see vitals. GENERAL: Alert, appears well and in no acute distress. HEENT: Atraumatic, conjunctiva clear, no obvious abnormalities on inspection of external nose and ears. NECK: Normal movements of the head and neck. CARDIOPULMONARY: No increased WOB. Speaking in clear sentences. I:E ratio WNL.  MS: Moves all visible extremities without noticeable abnormality. PSYCH: Pleasant and cooperative, well-groomed. Speech normal rate and rhythm. Affect is appropriate. Insight and judgement are appropriate. Attention is focused, linear, and appropriate.  NEURO: CN grossly intact. Oriented as arrived to appointment on time with no prompting. Moves both UE equally.  SKIN: No obvious lesions, wounds, erythema, or cyanosis noted on face or hands.  Depression screen [facility] 2/9 06/14/2018 08/16/2017 12/18/2014  Decreased Interest 0 0 0  Down, Depressed, Hopeless 0 0 0  PHQ - 2 Score 0 0 0    Assessment and Plan:   Rabiah was seen today for myalgias.  Diagnoses and all orders for this visit:  Osteoporosis, unspecified osteoporosis type, unspecified pathological fracture presence -     VITAMIN D 25 Hydroxy (Vit-D Deficiency, Fractures); Future  Myalgia -     Comp Met (CMET); Future -     CBC w/Diff; Future -     VITAMIN D 25 Hydroxy (Vit-D Deficiency, Fractures); Future  Vitamin D deficiency    . COVID-19 Education: The signs and symptoms of COVID-19 were discussed with the patient and how to seek care for testing if needed. The importance of social distancing was discussed today. . Reviewed expectations re: course of current medical issues. . Discussed self-management of  symptoms. . Outlined signs and symptoms indicating need for more acute intervention. . Patient verbalized understanding and all questions were answered. Marland Kitchen Health Maintenance issues including appropriate healthy diet, exercise, and smoking avoidance were discussed with patient. . See orders for this visit as documented in the electronic medical record.  Arnette Norris, MD  Records requested if needed. Time spent: 15 minutes, of which >50% was spent in obtaining information about her symptoms, reviewing her previous labs, evaluations, and treatments, counseling her about her condition (please see the discussed topics above), and developing a plan to further investigate it; she had a number of questions which I addressed.

## 2018-08-30 ENCOUNTER — Telehealth: Payer: Self-pay

## 2018-08-30 ENCOUNTER — Encounter: Payer: Self-pay | Admitting: Family Medicine

## 2018-08-30 ENCOUNTER — Telehealth (INDEPENDENT_AMBULATORY_CARE_PROVIDER_SITE_OTHER): Payer: Medicare Other | Admitting: Family Medicine

## 2018-08-30 VITALS — BP 132/98 | HR 80 | Temp 97.9°F | Ht <= 58 in | Wt 153.0 lb

## 2018-08-30 DIAGNOSIS — M25579 Pain in unspecified ankle and joints of unspecified foot: Secondary | ICD-10-CM

## 2018-08-30 DIAGNOSIS — E559 Vitamin D deficiency, unspecified: Secondary | ICD-10-CM

## 2018-08-30 DIAGNOSIS — M791 Myalgia, unspecified site: Secondary | ICD-10-CM | POA: Diagnosis not present

## 2018-08-30 DIAGNOSIS — M810 Age-related osteoporosis without current pathological fracture: Secondary | ICD-10-CM | POA: Diagnosis not present

## 2018-08-30 DIAGNOSIS — M2550 Pain in unspecified joint: Secondary | ICD-10-CM | POA: Insufficient documentation

## 2018-08-30 NOTE — Assessment & Plan Note (Signed)
>  15 minutes spent in face to face time with patient, >50% spent in counselling or coordination of care discussing pain she has in her back and feet from her fractures and Osteoporosis.  She feels the weather is playing a role.  She is taking NSAIDs again- sparingly and with food which does help.  I would like for her to come in to have labs drawn and prolia ordered for her so we can start her on this. The patient indicates understanding of these issues and agrees with the plan. Orders Placed This Encounter  Procedures  . Comp Met (CMET)  . CBC w/Diff  . VITAMIN D 25 Hydroxy (Vit-D Deficiency, Fractures)

## 2018-08-30 NOTE — Telephone Encounter (Signed)
RB-Dr. Deborra Medina wants this patient to restart Prolia injections  She also has future labs ordered in the system to be done prior to her first injection/when you get the Prolia magically ordered and stuff let me know when it arrives and I will be happy to schedule her for her lab visit/thx dmf

## 2018-08-31 ENCOUNTER — Encounter: Payer: Self-pay | Admitting: Family Medicine

## 2018-08-31 NOTE — Telephone Encounter (Signed)
Thank you/thx dmf

## 2018-08-31 NOTE — Telephone Encounter (Signed)
Completed insurance verification form for Prolia injection. Summary of Benefits pending at this time. Will reach out to the patient once this information has been received.

## 2018-09-04 ENCOUNTER — Other Ambulatory Visit: Payer: Self-pay

## 2018-09-04 ENCOUNTER — Encounter: Payer: Self-pay | Admitting: *Deleted

## 2018-09-04 ENCOUNTER — Encounter: Payer: Self-pay | Admitting: Family Medicine

## 2018-09-04 ENCOUNTER — Ambulatory Visit: Payer: Medicare Other | Admitting: Family Medicine

## 2018-09-04 VITALS — BP 130/70 | HR 83 | Temp 98.1°F | Ht <= 58 in | Wt 156.5 lb

## 2018-09-04 DIAGNOSIS — M19041 Primary osteoarthritis, right hand: Secondary | ICD-10-CM | POA: Diagnosis not present

## 2018-09-04 DIAGNOSIS — Z9884 Bariatric surgery status: Secondary | ICD-10-CM | POA: Diagnosis not present

## 2018-09-04 DIAGNOSIS — M7130 Other bursal cyst, unspecified site: Secondary | ICD-10-CM | POA: Diagnosis not present

## 2018-09-04 DIAGNOSIS — M19042 Primary osteoarthritis, left hand: Secondary | ICD-10-CM

## 2018-09-04 MED ORDER — DICLOFENAC SODIUM 1 % TD GEL: 2.0000 g | Freq: Four times a day (QID) | TRANSDERMAL | 5 refills | Status: DC

## 2018-09-04 NOTE — Progress Notes (Signed)
[name] Wirt [name], MD Primary Care and [place] at [facility] [place], [address] Phone: [phone]  FAX: [place] - 68 y.o. female  MRN [id number]  Date of Birth: 1950-10-06  Visit Date: 09/04/2018  PCP: [name] Passy, MD  Referred by: [name] Passy, MD  Chief Complaint  Patient presents with  . Bone Cyst    Right Pinky Finger  . Arthritis    Hands   Subjective:   [name] [name] is a 68 y.o. very pleasant female patient who presents with the following:  Very nice 68 year old patient referred courtesy of Dr. Deborra Medina:  B hand OA: She does have some intermittent aching in her hands and multiple joints that she is noticed when doing some basic activities such as using her computer, and she was also specifically concerned about some puffiness in a cyst that is formed at the DIP joint at the right fifth digit.  She does have some occasional joint swelling.  She is not had a history of prior fractures or major trauma in the hands or wrist.  She does report a significant traumatic fracture in 1 of her feet approximately 30 years ago.  She has a dull aching, when waking up in the morning and also with use and with overuse she does have some swelling.  She is status post laparoscopic sleeve gastrectomy, and her use of oral NSAIDs is limited.  Very rarely she will take meloxicam as needed.  She also wants advice from a maintenance program and some hand strengthening.  Past Medical History, Surgical History, Social History, Family History, Problem List, Medications, and Allergies have been reviewed and updated if relevant.  Patient Active Problem List   Diagnosis Date Noted  . Arthralgia 08/30/2018  . Myalgia 08/29/2018  . Vitamin D deficiency 08/29/2018  . Osteoporosis 06/14/2018  . Closed fracture of one rib of left side 04/03/2018  . Well woman exam with routine gynecological exam 10/13/2017   . Atypical lymphocytes present on peripheral blood smear 06/02/2016  . Family history of colon cancer-brother at age 20 05/27/2016  . S/P laparoscopic sleeve gastrectomy 12/18/2013  . Persistent disorder of initiating or maintaining sleep 11/05/2013  . Disorder of bone and cartilage 10/17/2009    Past Medical History:  Diagnosis Date  . Allergy   . Anemia    IN THE PAST WITH PREGNANCY  . Arthritis    LEFT FOOT; OCCAS LOWER BACK PAIN - HX OF VERTEBRAL FRACTURES  . Blood transfusion without reported diagnosis    DURING PREGNANCY  . Complication of anesthesia    a little sluggist when waking up-no intervention required  . Coronary artery spasm (HCC) 1990'S   AFTER OTC DIET PILLS - PT EXPERIENCED CHEST PAIN- PT HAD CARDIAC CATH - TOLD NO CAD - THOUGHT TO HAVE HAS CORNARY ARTERY SPASM  . H/O seasonal allergies   . Headache(784.0)    OCCAS SINUS HEADACHES  . Hearing impaired    LEFT EAR DEAFNESS  . Hypertension    220/104 IN SPRING 2015 - SAW HER DOCTOR - BUT B/P NORMALIZED - PT DECIDED TO STOP CAFFEINE AND HAS NOT HAD ANY ELEVATED PRESSURES SINCE JUNE  . Osteopenia   . Strabismus    HAD STRABISMUS SURGERY 1956 - BUT STILL HAS STRABISMUS  . Vitamin D deficiency     Past Surgical History:  Procedure Laterality Date  . ABDOMINAL HYSTERECTOMY    .  APPENDECTOMY    . BREATH TEK H PYLORI N/A 10/08/2013   Procedure: BREATH TEK H PYLORI;  Surgeon: Gayland Curry, MD;  Location: [facility];  Service: General;  Laterality: N/A;  . BREATH TEK H PYLORI N/A 12/03/2013   Procedure: BREATH TEK Kandis Ban;  Surgeon: Gayland Curry, MD;  Location: [facility];  Service: General;  Laterality: N/A;  . CHOLECYSTECTOMY N/A 04/13/2016   Procedure: LAPAROSCOPIC CHOLECYSTECTOMY WITH ATTEMPTED INTRAOPERATIVE CHOLANGIOGRAM;  Surgeon: Greer Pickerel, MD;  Location: [facility];  Service: General;  Laterality: N/A;  . COLONOSCOPY    . EYE SURGERY Bilateral 2017   cataract surgery with lens implant  . INNER EAR SURGERY  Left 1965   RADICAL MASTOIDECTOMY  . LAPAROSCOPIC GASTRIC SLEEVE RESECTION N/A 12/18/2013   Procedure: LAPAROSCOPIC GASTRIC SLEEVE RESECTION;  Surgeon: Greer Pickerel, MD;  Location: [facility];  Service: General;  Laterality: N/A;  . REPAIR TENDONS FOOT Right 1994   AND RECONSTRUCTIVE SURGERY FOR MULTIPLE FRACTURES OF THE RIGHT FOOT  . STOMACH SURGERY    . strabismus repair    . TONSILLECTOMY      Social History   Socioeconomic History  . Marital status: Married    Spouse name: Not on file  . Number of children: 2  . Years of education: Not on file  . Highest education level: Not on file  Occupational History  . Occupation: Programmer, multimedia: [employer]  . Financial resource strain: Not on file  . Food insecurity:    Worry: Not on file    Inability: Not on file  . Transportation needs:    Medical: Not on file    Non-medical: Not on file  Tobacco Use  . Smoking status: Never Smoker  . Smokeless tobacco: Never Used  Substance and Sexual Activity  . Alcohol use: No    Alcohol/week: 0.0 standard drinks  . Drug use: No  . Sexual activity: Not on file  Lifestyle  . Physical activity:    Days per week: Not on file    Minutes per session: Not on file  . Stress: Not on file  Relationships  . Social connections:    Talks on phone: Not on file    Gets together: Not on file    Attends religious service: Not on file    Active member of club or organization: Not on file    Attends meetings of clubs or organizations: Not on file    Relationship status: Not on file  . Intimate partner violence:    Fear of current or ex partner: Not on file    Emotionally abused: Not on file    Physically abused: Not on file    Forced sexual activity: Not on file  Other Topics Concern  . 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 Hypertension Mother   . Diabetes Mother   . Obesity Mother   . Heart disease Mother   . Alcohol abuse Father   . COPD Other   . Hyperlipidemia Other   . Colon cancer Brother 9  . Stomach cancer Neg Hx   . Rectal cancer Neg Hx   . Esophageal cancer Neg Hx   . Pancreatic cancer Neg Hx     Allergies  Allergen Reactions  . Augmentin [Amoxicillin-Pot Clavulanate] Diarrhea and Nausea Only  . Morphine And Related Other (See Comments)    DIZZINESS HYPOTENSION HYPOXIA    Medication list reviewed and updated in full in [place].  GEN: No fevers, chills. Nontoxic. Primarily MSK c/o today. MSK: Detailed in the HPI GI: tolerating PO intake without difficulty Neuro: No numbness, parasthesias, or tingling associated. Otherwise the pertinent positives of the ROS are noted above.   Objective:   BP 130/70   Pulse 83   Temp 98.1 F (36.7 C) (Oral)   Ht '4\' 10"'$  (1.473 m)   Wt 156 lb 8 oz (71 kg)   BMI 32.71 kg/m    GEN: WDWN, NAD, Non-toxic, Alert & Oriented x 3 HEENT: Atraumatic, Normocephalic.  Ears and Nose: No external deformity. EXTR: No clubbing/cyanosis/edema NEURO: Normal gait.  PSYCH: Normally interactive. Conversant. Not depressed or anxious appearing.  Calm demeanor.   B hand Ecchymosis or edema: neg ROM wrist/hand/digits: full with the exception at the R 5th DIP which is limited Carpals, MCP's, digits: She does have some rare synovitis, primarily in the MCP joints, worse on the right hand.  There is also some swelling and a synovial cyst on the right fifth digit DIP. Distal Ulna and Radius: NT Ecchymosis or edema: neg No instability Cysts/nodules: R 5th Digit triggering: neg Finkelstein's test: neg Snuffbox tenderness: neg Scaphoid tubercle: NT Resisted supination: NT Full composite fist, no malrotation Grip, all digits: 5/5 str DIPJT: NT PIP JT: NT MCP JT: NT No tenosynovitis Axial load test: neg Phalen's: neg  Tinel's: neg Atrophy: Significant bilateral hyperthenar and thenar atrophy with also some diffuse hand muscular atrophy as well.  Hand sensation: intact   Radiology: No results found.  Assessment and Plan:   Primary osteoarthritis of both hands  Synovial cyst  S/P laparoscopic sleeve gastrectomy  >25 minutes spent in face to face time with patient, >50% spent in counselling or coordination of care: She has some bilateral hand arthritis, mild to moderate in character clinically.  Relatively good use and range of motion of her fingers with the exception of the right fifth DIP with synovial cyst.  I reassured her about the synovial cyst, and it may even go away on its own, but there is essentially nothing to do about it.  I gave her a rehab program to systematically strengthen her hands and work on range of motion, which will likely help in the long-term.  We also talked about various ways she could manage her pain including over-the-counter treatments that she could try.  In a setting of prior laparoscopic sleeve gastrectomy, I counselled her to minimally use oral NSAIDS, and I gave her some topical Voltaren.  I appreciate the opportunity to evaluate this very friendly patient. If you have any question regarding her care or prognosis, do not hesitate to ask.   Patient Instructions  OSTEOARTHRITIS:  For symptomatic relief:  Tylenol: 2 tablets up to 3-4 times a day Regular NSAIDS are helpful (avoid in kidney disease and ulcers)  Topical Capzaicin Cream, as needed (wear glove to put on) - THIS IS EXCEPTIONALLY HOT (NOT ON YOUR HANDS!!!)  Supplements: Tart cherry juice and Curcumin (Turmeric extract) have good scientific evidence  Glucosamine and Chondroitin often helpful - will take about 3 months to see if you have an effect. If you do, great, keep them up, if none at that point, no need to take in the future.  Omega-3 fish oils may help, 2 grams  daily  Ice joints on bad days,  20 min, 2-3 x / day REGULAR EXERCISE: swimming, Yoga, Tai Chi, bicycle (NON-IMPACT activity)   Weight loss will always take stress off of the joints and back     Follow-up: Call or return to clinic prn if these symptoms worsen or fail to improve as anticipated.   Meds ordered this encounter  Medications  . diclofenac sodium (VOLTAREN) 1 % GEL    Sig: Apply 2 g topically 4 (four) times daily.    Dispense:  3 Tube    Refill:  5   Signed,  Keenya Matera T. Ayeden Gladman, MD   Outpatient Encounter Medications as of 09/04/2018  Medication Sig  . bisacodyl (DULCOLAX) 5 MG EC tablet Take 5 mg by mouth 3 (three) times a week.  . Calcium 600-200 MG-UNIT tablet Take 1 tablet by mouth daily.  Marland Kitchen docusate sodium (COLACE) 100 MG capsule Take 100 mg by mouth daily.  . meloxicam (MOBIC) 15 MG tablet Take 1 tablet (15 mg total) by mouth daily.  Marland Kitchen nystatin (MYCOSTATIN/NYSTOP) powder Apply topically 2 (two) times daily.  . ondansetron (ZOFRAN) 4 MG tablet Take 1 tablet (4 mg total) by mouth every 6 (six) hours.  Orlie Dakin Sodium (PERI-COLACE PO) Take by mouth.  . vitamin B-12 (CYANOCOBALAMIN) 500 MCG tablet Take 500 mcg by mouth daily.  . Vitamin D, Cholecalciferol, 1000 units TABS Take 1 tablet by mouth daily.  . diclofenac sodium (VOLTAREN) 1 % GEL Apply 2 g topically 4 (four) times daily.   No facility-administered encounter medications on file as of 09/04/2018.

## 2018-09-04 NOTE — Patient Instructions (Addendum)
OSTEOARTHRITIS:  For symptomatic relief:  Tylenol: 2 tablets up to 3-4 times a day Regular NSAIDS are helpful (avoid in kidney disease and ulcers)  Topical Capzaicin Cream, as needed (wear glove to put on) - THIS IS EXCEPTIONALLY HOT (NOT ON YOUR HANDS!!!)  Supplements: Tart cherry juice and Curcumin (Turmeric extract) have good scientific evidence  Glucosamine and Chondroitin often helpful - will take about 3 months to see if you have an effect. If you do, great, keep them up, if none at that point, no need to take in the future.  Omega-3 fish oils may help, 2 grams daily  Ice joints on bad days, 20 min, 2-3 x / day REGULAR EXERCISE: swimming, Yoga, Tai Chi, bicycle (NON-IMPACT activity)   Weight loss will always take stress off of the joints and back

## 2018-09-12 ENCOUNTER — Telehealth: Payer: Self-pay | Admitting: Behavioral Health

## 2018-09-12 NOTE — Telephone Encounter (Signed)
Received Summary of Benefits for Prolia injection. No PA is required. The estimated out of pocket expense is $50.  Attempted to reach patient to discuss the above benefits and schedule nurse visit appointment. RN did not receive an answer at the time of call. Left v/m for a callback.

## 2018-09-15 ENCOUNTER — Other Ambulatory Visit (INDEPENDENT_AMBULATORY_CARE_PROVIDER_SITE_OTHER): Payer: Medicare Other

## 2018-09-15 DIAGNOSIS — M810 Age-related osteoporosis without current pathological fracture: Secondary | ICD-10-CM

## 2018-09-15 DIAGNOSIS — M791 Myalgia, unspecified site: Secondary | ICD-10-CM

## 2018-09-15 NOTE — Addendum Note (Signed)
Addended by: Lynnea Ferrier on: 09/15/2018 07:50 AM   Modules accepted: Orders

## 2018-09-16 LAB — COMPREHENSIVE METABOLIC PANEL
AG Ratio: 2.4 (calc) (ref 1.0–2.5)
ALT: 13 U/L (ref 6–29)
AST: 20 U/L (ref 10–35)
Albumin: 4.5 g/dL (ref 3.6–5.1)
Alkaline phosphatase (APISO): 75 U/L (ref 37–153)
BUN: 22 mg/dL (ref 7–25)
CO2: 29 mmol/L (ref 20–32)
Calcium: 9 mg/dL (ref 8.6–10.4)
Chloride: 104 mmol/L (ref 98–110)
Creat: 0.71 mg/dL (ref 0.50–0.99)
Globulin: 1.9 g/dL (calc) (ref 1.9–3.7)
Glucose, Bld: 83 mg/dL (ref 65–99)
Potassium: 4.6 mmol/L (ref 3.5–5.3)
Sodium: 142 mmol/L (ref 135–146)
Total Bilirubin: 0.3 mg/dL (ref 0.2–1.2)
Total Protein: 6.4 g/dL (ref 6.1–8.1)

## 2018-09-16 LAB — CBC WITH DIFFERENTIAL/PLATELET
Absolute Monocytes: 596 cells/uL (ref 200–950)
Basophils Absolute: 18 cells/uL (ref 0–200)
Basophils Relative: 0.2 %
Eosinophils Absolute: 62 cells/uL (ref 15–500)
Eosinophils Relative: 0.7 %
HCT: 38.6 % (ref 35.0–45.0)
Hemoglobin: 12.9 g/dL (ref 11.7–15.5)
Lymphs Abs: 4041 cells/uL — ABNORMAL HIGH (ref 850–3900)
MCH: 31.9 pg (ref 27.0–33.0)
MCHC: 33.4 g/dL (ref 32.0–36.0)
MCV: 95.5 fL (ref 80.0–100.0)
MPV: 10.8 fL (ref 7.5–12.5)
Monocytes Relative: 6.7 %
Neutro Abs: 4183 cells/uL (ref 1500–7800)
Neutrophils Relative %: 47 %
Platelets: 307 10*3/uL (ref 140–400)
RBC: 4.04 10*6/uL (ref 3.80–5.10)
RDW: 12.7 % (ref 11.0–15.0)
Total Lymphocyte: 45.4 %
WBC: 8.9 10*3/uL (ref 3.8–10.8)

## 2018-09-16 LAB — VITAMIN D 25 HYDROXY (VIT D DEFICIENCY, FRACTURES): Vit D, 25-Hydroxy: 52 ng/mL (ref 30–100)

## 2018-09-18 ENCOUNTER — Encounter: Payer: Self-pay | Admitting: Family Medicine

## 2018-09-27 ENCOUNTER — Other Ambulatory Visit: Payer: Self-pay

## 2018-09-27 ENCOUNTER — Ambulatory Visit
Admission: RE | Admit: 2018-09-27 | Discharge: 2018-09-27 | Disposition: A | Discharge status: Home / Self Care | Payer: Medicare Other | Source: Ambulatory Visit | Attending: Family Medicine | Admitting: Family Medicine

## 2018-09-27 DIAGNOSIS — Z1231 Encounter for screening mammogram for malignant neoplasm of breast: Secondary | ICD-10-CM

## 2018-10-24 ENCOUNTER — Encounter: Payer: Self-pay | Admitting: Family Medicine

## 2018-11-07 ENCOUNTER — Telehealth: Payer: Self-pay | Admitting: Behavioral Health

## 2018-11-07 ENCOUNTER — Ambulatory Visit (INDEPENDENT_AMBULATORY_CARE_PROVIDER_SITE_OTHER): Payer: Medicare Other

## 2018-11-07 DIAGNOSIS — M810 Age-related osteoporosis without current pathological fracture: Secondary | ICD-10-CM | POA: Diagnosis not present

## 2018-11-07 MED ORDER — DENOSUMAB 60 MG/ML ~~LOC~~ SOSY
60.0000 mg | PREFILLED_SYRINGE | Freq: Once | SUBCUTANEOUS | Status: AC
  Administered 2018-11-07: 60 mg via SUBCUTANEOUS

## 2018-11-07 NOTE — Progress Notes (Signed)
After obtaining consent, and per orders of Dr. Deborra Medina, injection of Prolia given by Lylla Eifler Berneta Sages. Patient instructed to remain in clinic for 20 minutes afterwards, and to report any adverse reaction to me immediately.

## 2018-11-07 NOTE — Telephone Encounter (Signed)

## 2018-11-09 NOTE — Progress Notes (Signed)
Agree with injection as documented.

## 2018-11-14 ENCOUNTER — Other Ambulatory Visit: Payer: Self-pay | Admitting: Family Medicine

## 2018-12-12 ENCOUNTER — Encounter: Payer: Self-pay | Admitting: Family Medicine

## 2018-12-13 ENCOUNTER — Other Ambulatory Visit: Payer: Self-pay

## 2018-12-13 MED ORDER — NYSTATIN 100000 UNIT/GM EX POWD: Freq: Two times a day (BID) | CUTANEOUS | 1 refills | Status: DC

## 2019-01-10 NOTE — H&P (Signed)
[name] [name] is an 68 y.o. female.   Chief Complaint: Conductive hearing loss HPI: History of mastoidectomy on left side many years ago with chronic conductive hearing loss.  Past Medical History:  Diagnosis Date  . Allergy   . Anemia    IN THE PAST WITH PREGNANCY  . Arthritis    LEFT FOOT; OCCAS LOWER BACK PAIN - HX OF VERTEBRAL FRACTURES  . Blood transfusion without reported diagnosis    DURING PREGNANCY  . Complication of anesthesia    a little sluggist when waking up-no intervention required  . Coronary artery spasm (HCC) 1990'S   AFTER OTC DIET PILLS - PT EXPERIENCED CHEST PAIN- PT HAD CARDIAC CATH - TOLD NO CAD - THOUGHT TO HAVE HAS CORNARY ARTERY SPASM  . H/O seasonal allergies   . Headache(784.0)    OCCAS SINUS HEADACHES  . Hearing impaired    LEFT EAR DEAFNESS  . Hypertension    220/104 IN SPRING 2015 - SAW HER DOCTOR - BUT B/P NORMALIZED - PT DECIDED TO STOP CAFFEINE AND HAS NOT HAD ANY ELEVATED PRESSURES SINCE JUNE  . Osteopenia   . Strabismus    HAD STRABISMUS SURGERY 1956 - BUT STILL HAS STRABISMUS  . Vitamin D deficiency     Past Surgical History:  Procedure Laterality Date  . ABDOMINAL HYSTERECTOMY    . APPENDECTOMY    . BREATH TEK H PYLORI N/A 10/08/2013   Procedure: BREATH TEK H PYLORI;  Surgeon: Gayland Curry, MD;  Location: [facility];  Service: General;  Laterality: N/A;  . BREATH TEK H PYLORI N/A 12/03/2013   Procedure: BREATH TEK Kandis Ban;  Surgeon: Gayland Curry, MD;  Location: [facility];  Service: General;  Laterality: N/A;  . CHOLECYSTECTOMY N/A 04/13/2016   Procedure: LAPAROSCOPIC CHOLECYSTECTOMY WITH ATTEMPTED INTRAOPERATIVE CHOLANGIOGRAM;  Surgeon: Greer Pickerel, MD;  Location: [facility];  Service: General;  Laterality: N/A;  . COLONOSCOPY    . EYE SURGERY Bilateral 2017   cataract surgery with lens implant  . INNER EAR SURGERY Left 1965   RADICAL MASTOIDECTOMY  . LAPAROSCOPIC GASTRIC SLEEVE RESECTION N/A 12/18/2013   Procedure: LAPAROSCOPIC  GASTRIC SLEEVE RESECTION;  Surgeon: Greer Pickerel, MD;  Location: [facility];  Service: General;  Laterality: N/A;  . REPAIR TENDONS FOOT Right 1994   AND RECONSTRUCTIVE SURGERY FOR MULTIPLE FRACTURES OF THE RIGHT FOOT  . STOMACH SURGERY    . strabismus repair    . TONSILLECTOMY      Family History  Problem Relation Age of Onset  . Hypertension Mother   . Diabetes Mother   . Obesity Mother   . Heart disease Mother   . Alcohol abuse Father   . COPD Other   . Hyperlipidemia Other   . Colon cancer Brother 13  . Stomach cancer Neg Hx   . Rectal cancer Neg Hx   . Esophageal cancer Neg Hx   . Pancreatic cancer Neg Hx    Social History:  reports that she has never smoked. She has never used smokeless tobacco. She reports that she does not drink alcohol or use drugs.  Allergies:  Allergies  Allergen Reactions  . Augmentin [Amoxicillin-Pot Clavulanate] Diarrhea and Nausea Only  . Morphine And Related Other (See Comments)    DIZZINESS HYPOTENSION HYPOXIA    No medications prior to admission.    No results found for this or any previous visit (from the past 48 hour(s)). No results found.  ROS: otherwise negative  There were no vitals taken  for this visit.  PHYSICAL EXAM: Overall appearance:  Healthy appearing, in no distress Head:  Normocephalic, atraumatic. Ears: Left mastoid cavity clean and dry. Nose: External nose is healthy in appearance. Internal nasal exam free of any lesions or obstruction. Oral Cavity/pharynx:  There are no mucosal lesions or masses identified. Hypopharynx/Larynx: no signs of any mucosal lesions or masses identified. Vocal cords move normally. Neuro:  No identifiable neurologic deficits. Neck: No palpable neck masses.  Studies Reviewed: none    Assessment/Plan Excellent BAHA candidate.  Proceed with surgery.  [name] 01/10/2019, 11:21 AM

## 2019-01-17 NOTE — Pre-Procedure Instructions (Signed)
[id number] [name], [address] [place] [address] [place] [address] Phone: [phone] Fax: [phone]      Your procedure is scheduled on  01-22-19  Report to [facility] Main Entrance "A" at 0530 A.M., and check in at the Admitting office.  Call this number if you have problems the morning of surgery:  [phone]  Call [phone] if you have any questions prior to your surgery date Monday-Friday 8am-4pm    Remember:  Do not eat or drink after midnight the night before your surgery  Take these medicines the morning of surgery with A SIP OF WATER : fluticasone (FLONASE)   7 days prior to surgery STOP taking any MOBIC,Aspirin (unless otherwise instructed by your surgeon), Aleve, Naproxen, Ibuprofen, Motrin, Advil, Goody's, BC's, all herbal medications, fish oil, and all vitamins.    The Morning of Surgery  Do not wear jewelry, make-up or nail polish.  Do not wear lotions, powders, or perfumes/colognes, or deodorant  Do not shave 48 hours prior to surgery.   Do not bring valuables to the hospital.  [facility] is not responsible for any belongings or valuables.  If you are a smoker, DO NOT Smoke 24 hours prior to surgery IF you wear a CPAP at night please bring your mask, tubing, and machine the morning of surgery   Remember that you must have someone to transport you home after your surgery, and remain with you for 24 hours if you are discharged the same day.   Contacts, glasses, hearing aids, dentures or bridgework may not be worn into surgery.    Leave your suitcase in the car.  After surgery it may be brought to your room.  For patients admitted to the hospital, discharge time will be determined by your treatment team.  Patients discharged the day of surgery will not be allowed to drive home.    Special instructions:   [facility] [facility]- Preparing For Surgery  Before surgery, you can play an important role. Because skin is not  sterile, your skin needs to be as free of germs as possible. You can reduce the number of germs on your skin by washing with CHG (chlorahexidine gluconate) Soap before surgery.  CHG is an antiseptic cleaner which kills germs and bonds with the skin to continue killing germs even after washing.    Oral Hygiene is also important to reduce your risk of infection.  Remember - BRUSH YOUR TEETH THE MORNING OF SURGERY WITH YOUR REGULAR TOOTHPASTE  Please do not use if you have an allergy to CHG or antibacterial soaps. If your skin becomes reddened/irritated stop using the CHG.  Do not shave (including legs and underarms) for at least 48 hours prior to first CHG shower. It is OK to shave your face.  Please follow these instructions carefully.   1. Shower the NIGHT BEFORE SURGERY and the MORNING OF SURGERY with CHG Soap.   2. If you chose to wash your hair, wash your hair first as usual with your normal shampoo.  3. After you shampoo, rinse your hair and body thoroughly to remove the shampoo.  4. Use CHG as you would any other liquid soap. You can apply CHG directly to the skin and wash gently with a scrungie or a clean washcloth.   5. Apply the CHG Soap to your body ONLY FROM THE NECK DOWN.  Do not use on open wounds or open sores. Avoid contact with your eyes, ears, mouth and genitals (private  parts). Wash Face and genitals (private parts)  with your normal soap.   6. Wash thoroughly, paying special attention to the area where your surgery will be performed.  7. Thoroughly rinse your body with warm water from the neck down.  8. DO NOT shower/wash with your normal soap after using and rinsing off the CHG Soap.  9. Pat yourself dry with a CLEAN TOWEL.  10. Wear CLEAN PAJAMAS to bed the night before surgery, wear comfortable clothes the morning of surgery  11. Place CLEAN SHEETS on your bed the night of your first shower and DO NOT SLEEP WITH PETS.  Day of Surgery:  Do not apply any  deodorants/lotions. Please shower the morning of surgery with the CHG soap  Please wear clean clothes to the hospital/surgery center.   Remember to brush your teeth WITH YOUR REGULAR TOOTHPASTE.   Please read over the fact sheets that you were given.

## 2019-01-18 ENCOUNTER — Other Ambulatory Visit (HOSPITAL_COMMUNITY)
Admission: RE | Admit: 2019-01-18 | Discharge: 2019-01-18 | Disposition: A | Discharge status: Home / Self Care | Payer: Medicare Other | Source: Ambulatory Visit

## 2019-01-18 ENCOUNTER — Encounter (HOSPITAL_COMMUNITY)
Admission: RE | Admit: 2019-01-18 | Discharge: 2019-01-18 | Disposition: A | Discharge status: Home / Self Care | Payer: Medicare Other | Source: Ambulatory Visit | Attending: Otolaryngology | Admitting: Otolaryngology

## 2019-01-18 ENCOUNTER — Encounter (HOSPITAL_COMMUNITY): Payer: Self-pay

## 2019-01-18 ENCOUNTER — Other Ambulatory Visit: Payer: Self-pay

## 2019-01-18 DIAGNOSIS — I491 Atrial premature depolarization: Secondary | ICD-10-CM | POA: Insufficient documentation

## 2019-01-18 DIAGNOSIS — Z01818 Encounter for other preprocedural examination: Secondary | ICD-10-CM | POA: Diagnosis present

## 2019-01-18 DIAGNOSIS — Z20828 Contact with and (suspected) exposure to other viral communicable diseases: Secondary | ICD-10-CM | POA: Insufficient documentation

## 2019-01-18 DIAGNOSIS — Z79899 Other long term (current) drug therapy: Secondary | ICD-10-CM | POA: Diagnosis not present

## 2019-01-18 DIAGNOSIS — Z6832 Body mass index (BMI) 32.0-32.9, adult: Secondary | ICD-10-CM | POA: Diagnosis not present

## 2019-01-18 DIAGNOSIS — E669 Obesity, unspecified: Secondary | ICD-10-CM | POA: Diagnosis not present

## 2019-01-18 DIAGNOSIS — H90A11 Conductive hearing loss, unilateral, right ear with restricted hearing on the contralateral side: Secondary | ICD-10-CM | POA: Insufficient documentation

## 2019-01-18 DIAGNOSIS — E559 Vitamin D deficiency, unspecified: Secondary | ICD-10-CM | POA: Insufficient documentation

## 2019-01-18 DIAGNOSIS — Z9884 Bariatric surgery status: Secondary | ICD-10-CM | POA: Insufficient documentation

## 2019-01-18 LAB — CBC
HCT: 40.6 % (ref 36.0–46.0)
Hemoglobin: 13.2 g/dL (ref 12.0–15.0)
MCH: 32.1 pg (ref 26.0–34.0)
MCHC: 32.5 g/dL (ref 30.0–36.0)
MCV: 98.8 fL (ref 80.0–100.0)
Platelets: 333 10*3/uL (ref 150–400)
RBC: 4.11 MIL/uL (ref 3.87–5.11)
RDW: 14 % (ref 11.5–15.5)
WBC: 10.8 10*3/uL — ABNORMAL HIGH (ref 4.0–10.5)
nRBC: 0 % (ref 0.0–0.2)

## 2019-01-18 LAB — BASIC METABOLIC PANEL
Anion gap: 10 (ref 5–15)
BUN: 15 mg/dL (ref 8–23)
CO2: 24 mmol/L (ref 22–32)
Calcium: 8.7 mg/dL — ABNORMAL LOW (ref 8.9–10.3)
Chloride: 108 mmol/L (ref 98–111)
Creatinine, Ser: 0.7 mg/dL (ref 0.44–1.00)
GFR calc Af Amer: >60 mL/min (ref >60)
GFR calc non Af Amer: >60 mL/min (ref >60)
Glucose, Bld: 94 mg/dL (ref 70–99)
Potassium: 4 mmol/L (ref 3.5–5.1)
Sodium: 142 mmol/L (ref 135–145)

## 2019-01-18 NOTE — Progress Notes (Signed)
Anesthesia Chart Review:  Case: [id number] Date/Time: 01/22/19 0715   Procedure: BONE ANCHORED HEARING AID (BAHA) IMPLANT (N/A )   Anesthesia type: General   Pre-op diagnosis: H90.A11 Conductive hearing loss of right ear with restricted hearing of left ear   Location: [facility] OR ROOM 09 / [facility] OR   Surgeon: [name], MD      DISCUSSION: Patient is a 68 year old female scheduled for the above procedure.  History includes never smoker, HTN (caffeine induced, 2015), left hearing impaired, obesity (s/p laparoscopic sleeve gastrectomy, 12/18/13). RCA vasospasm 2002 (likely catheter induced). She did not meet the AHI criteria for OSA by 2015 sleep study (done prior to bariatric surgery).  Denied chest pian and SOB per PAT RN visit notes. 01/18/19 COVID-19 test in process. If negative and otherwise not acute changes then I anticipate that she can proceed as planned   VS: BP 136/89   Pulse 81   Temp (!) 36.4 C (Tympanic)   Resp 18   Ht 4' 10.5" (1.486 m)   Wt 72.3 kg   SpO2 97%   BMI 32.76 kg/m    PROVIDERS: Lucille Passy, MD is PCP - She is not routinely followed by a cardiologist. She saw Dola Argyle, MD in 2002 for chest pain and 8 beats of VT during hospitalization. Coronaries showed normal cath with some RCA spasm on initial injection. (She saw Martinique, Peter, MD later on 07/06/13 for atypical chest pain. He felt the RCA spasm in 2002 was catheter induced and unlikely that patient had vasospastic coronary disease. Echo and EKG were normal. He did not recommended additional testing or on-going cardiology follow-up unless she developed worsening symptoms.)   LABS: Labs reviewed: Acceptable for surgery. (all labs ordered are listed, but only abnormal results are displayed)  Labs Reviewed  CBC - Abnormal; Notable for the following components:      Result Value   WBC 10.8 (*)    All other components within normal limits  BASIC METABOLIC PANEL - Abnormal; Notable for the following  components:   Calcium 8.7 (*)    All other components within normal limits     IMAGES: Left Ribs 2V Xray 03/3018: FINDINGS: The lungs are clear. Heart size is normal. No pneumothorax or pleural effusion. Surgical clips in the upper abdomen are noted. No fracture or other focal bony abnormality. IMPRESSION: Negative exam.  No acute finding.    EKG: 01/18/19: Sinus rhythm with Premature supraventricular complexes   CV: Echo 07/02/13:  Study Conclusions Left ventricle: The cavity size was normal. Wall thickness was normal. Systolic function was normal. The estimated ejection fraction was in the range of 60% to 65%. Wall motion was normal; there were no regional wall motion abnormalities.     Cardiac cath 04/29/00:  CONCLUSIONS: 1. Normal left ventricular function. EF > 55%. 2. No evidence of aortic dissection. 3. No high-grade coronary stenosis. (Some ostial spasm of the RCA with initial injection.)   Past Medical History:  Diagnosis Date  . Allergy   . Anemia    IN THE PAST WITH PREGNANCY  . Arthritis    LEFT FOOT; OCCAS LOWER BACK PAIN - HX OF VERTEBRAL FRACTURES  . Blood transfusion without reported diagnosis    DURING PREGNANCY  . Complication of anesthesia    a little sluggist when waking up-no intervention required  . Coronary artery spasm (HCC) 1990'S   AFTER OTC DIET PILLS - PT EXPERIENCED CHEST PAIN- PT HAD CARDIAC CATH - TOLD NO CAD - THOUGHT  TO HAVE HAS CORNARY ARTERY SPASM  . H/O seasonal allergies   . Headache(784.0)    OCCAS SINUS HEADACHES  . Hearing impaired    LEFT EAR DEAFNESS  . Hypertension    220/104 IN SPRING 2015 - SAW HER DOCTOR - BUT B/P NORMALIZED - PT DECIDED TO STOP CAFFEINE AND HAS NOT HAD ANY ELEVATED PRESSURES SINCE JUNE  . Osteopenia   . Strabismus    HAD STRABISMUS SURGERY 1956 - BUT STILL HAS STRABISMUS  . Vitamin D deficiency     Past Surgical History:  Procedure Laterality Date  . ABDOMINAL HYSTERECTOMY    . BREATH  TEK H PYLORI N/A 10/08/2013   Procedure: BREATH TEK H PYLORI;  Surgeon: Gayland Curry, MD;  Location: [facility];  Service: General;  Laterality: N/A;  . BREATH TEK H PYLORI N/A 12/03/2013   Procedure: BREATH TEK Kandis Ban;  Surgeon: Gayland Curry, MD;  Location: [facility];  Service: General;  Laterality: N/A;  . CHOLECYSTECTOMY N/A 04/13/2016   Procedure: LAPAROSCOPIC CHOLECYSTECTOMY WITH ATTEMPTED INTRAOPERATIVE CHOLANGIOGRAM;  Surgeon: Greer Pickerel, MD;  Location: [facility];  Service: General;  Laterality: N/A;  . COLONOSCOPY    . EYE SURGERY Bilateral 2017   cataract surgery with lens implant  . INNER EAR SURGERY Left 1965   RADICAL MASTOIDECTOMY  . LAPAROSCOPIC GASTRIC SLEEVE RESECTION N/A 12/18/2013   Procedure: LAPAROSCOPIC GASTRIC SLEEVE RESECTION;  Surgeon: Greer Pickerel, MD;  Location: [facility];  Service: General;  Laterality: N/A;  . REPAIR TENDONS FOOT Right 1994   AND RECONSTRUCTIVE SURGERY FOR MULTIPLE FRACTURES OF THE RIGHT FOOT  . STOMACH SURGERY    . strabismus repair    . TONSILLECTOMY      MEDICATIONS: . bisacodyl (DULCOLAX) 5 MG EC tablet  . Calcium 600-200 MG-UNIT tablet  . diclofenac sodium (VOLTAREN) 1 % GEL  . docusate sodium (COLACE) 100 MG capsule  . fluticasone (FLONASE) 50 MCG/ACT nasal spray  . meloxicam (MOBIC) 15 MG tablet  . nystatin (MYCOSTATIN/NYSTOP) powder  . ondansetron (ZOFRAN) 4 MG tablet  . Polyethyl Glycol-Propyl Glycol (SYSTANE) 0.4-0.3 % SOLN  . vitamin B-12 (CYANOCOBALAMIN) 500 MCG tablet  . Vitamin D, Cholecalciferol, 1000 units TABS   No current facility-administered medications for this encounter.     Myra Gianotti, PA-C Surgical Short Stay/Anesthesiology [facility] Phone [phone] [facility] Phone [phone] 01/18/2019 3:40 PM

## 2019-01-18 NOTE — Anesthesia Preprocedure Evaluation (Addendum)
Anesthesia Evaluation  Patient identified by MRN, date of birth, ID band Patient awake    Reviewed: Allergy & Precautions, NPO status , Patient's Chart, lab work & pertinent test results  Airway Mallampati: I  TM Distance: >3 FB Neck ROM: Full    Dental  (+) Teeth Intact, Dental Advisory Given   Pulmonary neg pulmonary ROS,    breath sounds clear to auscultation       Cardiovascular hypertension,  Rhythm:Regular Rate:Normal     Neuro/Psych negative neurological ROS     GI/Hepatic negative GI ROS, Neg liver ROS,   Endo/Other  negative endocrine ROS  Renal/GU negative Renal ROS     Musculoskeletal   Abdominal   Peds  Hematology negative hematology ROS (+)   Anesthesia Other Findings   Reproductive/Obstetrics                          Lab Results  Component Value Date   WBC 10.8 (H) 01/18/2019   HGB 13.2 01/18/2019   HCT 40.6 01/18/2019   MCV 98.8 01/18/2019   PLT 333 01/18/2019   Lab Results  Component Value Date   CREATININE 0.70 01/18/2019   BUN 15 01/18/2019   NA 142 01/18/2019   K 4.0 01/18/2019   CL 108 01/18/2019   CO2 24 01/18/2019    Anesthesia Physical Anesthesia Plan  ASA: II  Anesthesia Plan: General   Post-op Pain Management:    Induction: Intravenous  PONV Risk Score and Plan: 3 and Dexamethasone and Ondansetron  Airway Management Planned: Oral ETT  Additional Equipment:   Intra-op Plan:   Post-operative Plan: Extubation in OR  Informed Consent: I have reviewed the patients History and Physical, chart, labs and discussed the procedure including the risks, benefits and alternatives for the proposed anesthesia with the patient or authorized representative who has indicated his/her understanding and acceptance.     Dental advisory given  Plan Discussed with: CRNA  Anesthesia Plan Comments: ( )      Anesthesia Quick Evaluation

## 2019-01-18 NOTE — Progress Notes (Signed)
PCP - DR Deborra Medina Cardiologist - NA     Chest x-ray - NA EKG - TODAY Stress Test - NA ECHO - 3/15 Cardiac Cath - 30 YRS AGO       Aspirin Instructions:STOP     COVID TEST- TODAY  Anesthesia review: [name] REVIEWED  Patient denies shortness of breath, fever, cough and chest pain at PAT appointment   All instructions explained to the patient, with a verbal understanding of the material. Patient agrees to go over the instructions while at home for a better understanding. Patient also instructed to self quarantine after being tested for COVID-19. The opportunity to ask questions was provided.

## 2019-01-20 LAB — NOVEL CORONAVIRUS, NAA (HOSP ORDER, SEND-OUT TO REF LAB; TAT 18-24 HRS): SARS-CoV-2, NAA: NOT DETECTED

## 2019-01-23 ENCOUNTER — Other Ambulatory Visit: Payer: Self-pay

## 2019-01-23 ENCOUNTER — Ambulatory Visit: Payer: Medicare Other

## 2019-01-23 ENCOUNTER — Other Ambulatory Visit
Admission: RE | Admit: 2019-01-23 | Discharge: 2019-01-23 | Disposition: A | Discharge status: Home / Self Care | Payer: Medicare Other | Source: Ambulatory Visit | Attending: Otolaryngology | Admitting: Otolaryngology

## 2019-01-23 DIAGNOSIS — Z20828 Contact with and (suspected) exposure to other viral communicable diseases: Secondary | ICD-10-CM | POA: Diagnosis not present

## 2019-01-23 DIAGNOSIS — Z01812 Encounter for preprocedural laboratory examination: Secondary | ICD-10-CM | POA: Diagnosis present

## 2019-01-23 DIAGNOSIS — Z20822 Contact with and (suspected) exposure to covid-19: Secondary | ICD-10-CM

## 2019-01-23 LAB — SARS CORONAVIRUS 2 (TAT 6-24 HRS): SARS Coronavirus 2: NEGATIVE

## 2019-01-26 ENCOUNTER — Ambulatory Visit (HOSPITAL_COMMUNITY): Payer: Medicare Other | Admitting: Certified Registered Nurse Anesthetist

## 2019-01-26 ENCOUNTER — Encounter (HOSPITAL_COMMUNITY): Payer: Self-pay

## 2019-01-26 ENCOUNTER — Other Ambulatory Visit: Payer: Self-pay

## 2019-01-26 ENCOUNTER — Ambulatory Visit (HOSPITAL_COMMUNITY)
Admission: RE | Admit: 2019-01-26 | Discharge: 2019-01-26 | Disposition: A | Discharge status: Home / Self Care | Payer: Medicare Other | Attending: Otolaryngology | Admitting: Otolaryngology

## 2019-01-26 ENCOUNTER — Encounter (HOSPITAL_COMMUNITY)
Admission: RE | Disposition: A | Discharge status: Home / Self Care | Payer: Self-pay | Source: Home / Self Care | Attending: Otolaryngology

## 2019-01-26 ENCOUNTER — Ambulatory Visit (HOSPITAL_COMMUNITY): Payer: Medicare Other | Admitting: Vascular Surgery

## 2019-01-26 ENCOUNTER — Other Ambulatory Visit: Payer: Self-pay | Admitting: Family Medicine

## 2019-01-26 DIAGNOSIS — Z825 Family history of asthma and other chronic lower respiratory diseases: Secondary | ICD-10-CM | POA: Insufficient documentation

## 2019-01-26 DIAGNOSIS — Z9884 Bariatric surgery status: Secondary | ICD-10-CM | POA: Insufficient documentation

## 2019-01-26 DIAGNOSIS — Z800 Family history of malignant neoplasm of digestive organs: Secondary | ICD-10-CM | POA: Diagnosis not present

## 2019-01-26 DIAGNOSIS — Z881 Allergy status to other antibiotic agents status: Secondary | ICD-10-CM | POA: Insufficient documentation

## 2019-01-26 DIAGNOSIS — Z885 Allergy status to narcotic agent status: Secondary | ICD-10-CM | POA: Diagnosis not present

## 2019-01-26 DIAGNOSIS — Z8249 Family history of ischemic heart disease and other diseases of the circulatory system: Secondary | ICD-10-CM | POA: Insufficient documentation

## 2019-01-26 DIAGNOSIS — Z833 Family history of diabetes mellitus: Secondary | ICD-10-CM | POA: Diagnosis not present

## 2019-01-26 DIAGNOSIS — Z880 Allergy status to penicillin: Secondary | ICD-10-CM | POA: Insufficient documentation

## 2019-01-26 DIAGNOSIS — M8580 Other specified disorders of bone density and structure, unspecified site: Secondary | ICD-10-CM | POA: Insufficient documentation

## 2019-01-26 DIAGNOSIS — M1990 Unspecified osteoarthritis, unspecified site: Secondary | ICD-10-CM | POA: Insufficient documentation

## 2019-01-26 DIAGNOSIS — Z823 Family history of stroke: Secondary | ICD-10-CM | POA: Diagnosis not present

## 2019-01-26 DIAGNOSIS — Z811 Family history of alcohol abuse and dependence: Secondary | ICD-10-CM | POA: Insufficient documentation

## 2019-01-26 DIAGNOSIS — H90A11 Conductive hearing loss, unilateral, right ear with restricted hearing on the contralateral side: Secondary | ICD-10-CM | POA: Diagnosis present

## 2019-01-26 DIAGNOSIS — Z9049 Acquired absence of other specified parts of digestive tract: Secondary | ICD-10-CM | POA: Diagnosis not present

## 2019-01-26 DIAGNOSIS — Z90710 Acquired absence of both cervix and uterus: Secondary | ICD-10-CM | POA: Diagnosis not present

## 2019-01-26 DIAGNOSIS — I10 Essential (primary) hypertension: Secondary | ICD-10-CM | POA: Diagnosis not present

## 2019-01-26 DIAGNOSIS — S2232XA Fracture of one rib, left side, initial encounter for closed fracture: Secondary | ICD-10-CM

## 2019-01-26 HISTORY — PX: BONE ANCHORED HEARING AID IMPLANT: SHX5193

## 2019-01-26 SURGERY — INSERTION, BONE ANCHORED HEARING AID
Anesthesia: General | Site: Ear | Laterality: Left

## 2019-01-26 MED ORDER — ACETAMINOPHEN 500 MG PO TABS
1000.0000 mg | ORAL_TABLET | Freq: Once | ORAL | Status: AC
  Administered 2019-01-26: 07:00:00 1000 mg via ORAL
  Filled 2019-01-26: qty 2

## 2019-01-26 MED ORDER — SODIUM CHLORIDE 0.9 % IV SOLN
INTRAVENOUS | Status: DC | PRN
  Administered 2019-01-26: 25 ug/min via INTRAVENOUS

## 2019-01-26 MED ORDER — PROPOFOL 10 MG/ML IV BOLUS
INTRAVENOUS | Status: AC
  Filled 2019-01-26: qty 20

## 2019-01-26 MED ORDER — LIDOCAINE 2% (20 MG/ML) 5 ML SYRINGE
INTRAMUSCULAR | Status: AC
  Filled 2019-01-26: qty 5

## 2019-01-26 MED ORDER — MIDAZOLAM HCL 5 MG/5ML IJ SOLN
INTRAMUSCULAR | Status: DC | PRN
  Administered 2019-01-26: 2 mg via INTRAVENOUS

## 2019-01-26 MED ORDER — FENTANYL CITRATE (PF) 100 MCG/2ML IJ SOLN: 25.0000 ug | INTRAMUSCULAR | Status: DC | PRN

## 2019-01-26 MED ORDER — FENTANYL CITRATE (PF) 250 MCG/5ML IJ SOLN
INTRAMUSCULAR | Status: AC
  Filled 2019-01-26: qty 5

## 2019-01-26 MED ORDER — CEFAZOLIN SODIUM 1 G IJ SOLR
INTRAMUSCULAR | Status: AC
  Filled 2019-01-26: qty 20

## 2019-01-26 MED ORDER — PHENYLEPHRINE 40 MCG/ML (10ML) SYRINGE FOR IV PUSH (FOR BLOOD PRESSURE SUPPORT)
PREFILLED_SYRINGE | INTRAVENOUS | Status: AC
  Filled 2019-01-26: qty 10

## 2019-01-26 MED ORDER — LIDOCAINE 2% (20 MG/ML) 5 ML SYRINGE
INTRAMUSCULAR | Status: DC | PRN
  Administered 2019-01-26: 60 mg via INTRAVENOUS

## 2019-01-26 MED ORDER — PROPOFOL 10 MG/ML IV BOLUS
INTRAVENOUS | Status: DC | PRN
  Administered 2019-01-26: 20 mg via INTRAVENOUS
  Administered 2019-01-26: 120 mg via INTRAVENOUS

## 2019-01-26 MED ORDER — FENTANYL CITRATE (PF) 250 MCG/5ML IJ SOLN
INTRAMUSCULAR | Status: DC | PRN
  Administered 2019-01-26 (×2): 50 ug via INTRAVENOUS

## 2019-01-26 MED ORDER — ONDANSETRON HCL 4 MG/2ML IJ SOLN
INTRAMUSCULAR | Status: AC
  Filled 2019-01-26: qty 2

## 2019-01-26 MED ORDER — CEPHALEXIN 500 MG PO CAPS: 500.0000 mg | ORAL_CAPSULE | Freq: Three times a day (TID) | ORAL | 0 refills | Status: DC

## 2019-01-26 MED ORDER — ROCURONIUM BROMIDE 10 MG/ML (PF) SYRINGE
PREFILLED_SYRINGE | INTRAVENOUS | Status: AC
  Filled 2019-01-26: qty 10

## 2019-01-26 MED ORDER — ONDANSETRON HCL 4 MG/2ML IJ SOLN
INTRAMUSCULAR | Status: DC | PRN
  Administered 2019-01-26: 4 mg via INTRAVENOUS

## 2019-01-26 MED ORDER — ROCURONIUM BROMIDE 10 MG/ML (PF) SYRINGE
PREFILLED_SYRINGE | INTRAVENOUS | Status: DC | PRN
  Administered 2019-01-26: 40 mg via INTRAVENOUS

## 2019-01-26 MED ORDER — CEFAZOLIN SODIUM-DEXTROSE 2-3 GM-%(50ML) IV SOLR
INTRAVENOUS | Status: DC | PRN
  Administered 2019-01-26: 2 g via INTRAVENOUS

## 2019-01-26 MED ORDER — DEXAMETHASONE SODIUM PHOSPHATE 10 MG/ML IJ SOLN
INTRAMUSCULAR | Status: AC
  Filled 2019-01-26: qty 1

## 2019-01-26 MED ORDER — LACTATED RINGERS IV SOLN
INTRAVENOUS | Status: DC | PRN
  Administered 2019-01-26 (×2): via INTRAVENOUS

## 2019-01-26 MED ORDER — MIDAZOLAM HCL 2 MG/2ML IJ SOLN
INTRAMUSCULAR | Status: AC
  Filled 2019-01-26: qty 2

## 2019-01-26 MED ORDER — 0.9 % SODIUM CHLORIDE (POUR BTL) OPTIME
TOPICAL | Status: DC | PRN
  Administered 2019-01-26: 1000 mL

## 2019-01-26 MED ORDER — PHENYLEPHRINE 40 MCG/ML (10ML) SYRINGE FOR IV PUSH (FOR BLOOD PRESSURE SUPPORT)
PREFILLED_SYRINGE | INTRAVENOUS | Status: DC | PRN
  Administered 2019-01-26 (×2): 80 ug via INTRAVENOUS
  Administered 2019-01-26: 40 ug via INTRAVENOUS
  Administered 2019-01-26: 80 ug via INTRAVENOUS

## 2019-01-26 MED ORDER — EPHEDRINE 5 MG/ML INJ
INTRAVENOUS | Status: AC
  Filled 2019-01-26: qty 20

## 2019-01-26 MED ORDER — LIDOCAINE-EPINEPHRINE 1 %-1:100000 IJ SOLN
INTRAMUSCULAR | Status: DC | PRN
  Administered 2019-01-26: 3 mL

## 2019-01-26 MED ORDER — KETOROLAC TROMETHAMINE 30 MG/ML IJ SOLN
INTRAMUSCULAR | Status: AC
  Filled 2019-01-26: qty 1

## 2019-01-26 MED ORDER — LIDOCAINE-EPINEPHRINE 1 %-1:100000 IJ SOLN
INTRAMUSCULAR | Status: AC
  Filled 2019-01-26: qty 1

## 2019-01-26 MED ORDER — DEXAMETHASONE SODIUM PHOSPHATE 10 MG/ML IJ SOLN
INTRAMUSCULAR | Status: DC | PRN
  Administered 2019-01-26: 10 mg via INTRAVENOUS

## 2019-01-26 MED ORDER — BACITRACIN ZINC 500 UNIT/GM EX OINT
TOPICAL_OINTMENT | CUTANEOUS | Status: AC
  Filled 2019-01-26: qty 28.35

## 2019-01-26 MED ORDER — PROMETHAZINE HCL 25 MG/ML IJ SOLN: 6.2500 mg | INTRAMUSCULAR | Status: DC | PRN

## 2019-01-26 MED ORDER — SUGAMMADEX SODIUM 200 MG/2ML IV SOLN
INTRAVENOUS | Status: DC | PRN
  Administered 2019-01-26: 150 mg via INTRAVENOUS

## 2019-01-26 MED ORDER — SUCCINYLCHOLINE CHLORIDE 200 MG/10ML IV SOSY
PREFILLED_SYRINGE | INTRAVENOUS | Status: AC
  Filled 2019-01-26: qty 30

## 2019-01-26 SURGICAL SUPPLY — 59 items
Allevyn ×2 IMPLANT
BIT DRILL WIDENING 4MM (DRILL) IMPLANT
BLADE CLIPPER SURG (BLADE) ×2 IMPLANT
BLADE DERMATOME DISP (MISCELLANEOUS) IMPLANT
BLADE STAB KNIFE 15DEG (BLADE) ×2 IMPLANT
BLADE SURG 15 STRL LF DISP TIS (BLADE) IMPLANT
BLADE SURG 15 STRL SS (BLADE)
CANISTER SUCT 3000ML PPV (MISCELLANEOUS) ×2 IMPLANT
CAP HEALING (MISCELLANEOUS) IMPLANT
CAP HEALING W/PLUG 030MM (MISCELLANEOUS) ×2 IMPLANT
CLEANER TIP ELECTROSURG 2X2 (MISCELLANEOUS) ×2 IMPLANT
CORD BIPOLAR FORCEPS 12FT (ELECTRODE) IMPLANT
COVER SURGICAL LIGHT HANDLE (MISCELLANEOUS) ×2 IMPLANT
COVER WAND RF STERILE (DRAPES) ×2 IMPLANT
DECANTER SPIKE VIAL GLASS SM (MISCELLANEOUS) IMPLANT
DERMABOND ADVANCED (GAUZE/BANDAGES/DRESSINGS) ×1
DERMABOND ADVANCED .7 DNX12 (GAUZE/BANDAGES/DRESSINGS) IMPLANT
DRAPE HALF SHEET 40X57 (DRAPES) IMPLANT
DRAPE INCISE 23X17 IOBAN STRL (DRAPES)
DRAPE INCISE 23X17 STRL (DRAPES) IMPLANT
DRAPE INCISE IOBAN 23X17 STRL (DRAPES) IMPLANT
DRILL COUNTERSINK 3MM (DRILL) IMPLANT
DRILL COUNTERSINK 4MM (DRILL) IMPLANT
DRILL GUIDE 3-4MM (DRILL) IMPLANT
DRILL WIDENING 4MM (DRILL) ×2
ELECT COATED BLADE 2.86 ST (ELECTRODE) ×2 IMPLANT
ELECT REM PT RETURN 9FT ADLT (ELECTROSURGICAL) ×2
ELECTRODE REM PT RTRN 9FT ADLT (ELECTROSURGICAL) ×1 IMPLANT
FORCEPS BIPOLAR SPETZLER 8 1.0 (NEUROSURGERY SUPPLIES) ×1 IMPLANT
GAUZE 4X4 16PLY RFD (DISPOSABLE) IMPLANT
GLOVE BIO SURGEON STRL SZ7.5 (GLOVE) ×2 IMPLANT
GOWN STRL REUS W/ TWL LRG LVL3 (GOWN DISPOSABLE) ×1 IMPLANT
GOWN STRL REUS W/TWL LRG LVL3 (GOWN DISPOSABLE) ×1
GUIDE DRILL EAR BAHA 3MM 4MM (MISCELLANEOUS) ×1 IMPLANT
IMPL EAR ABUTMENT 4M W/8M (Prosthesis and Implant ENT) IMPLANT
IMPLANT EAR ABUTMENT 4M W/8M (Prosthesis and Implant ENT) ×2 IMPLANT
KIT BASIN OR (CUSTOM PROCEDURE TRAY) ×2 IMPLANT
KIT TURNOVER KIT B (KITS) ×2 IMPLANT
NDL PRECISIONGLIDE 27X1.5 (NEEDLE) ×1 IMPLANT
NEEDLE PRECISIONGLIDE 27X1.5 (NEEDLE) ×4 IMPLANT
NS IRRIG 1000ML POUR BTL (IV SOLUTION) ×2 IMPLANT
PAD ARMBOARD 7.5X6 YLW CONV (MISCELLANEOUS) ×4 IMPLANT
PENCIL FOOT CONTROL (ELECTRODE) ×2 IMPLANT
POSITIONER HEAD DONUT 9IN (MISCELLANEOUS) ×2 IMPLANT
PROCESSOR BAHA 5 SILVER (OTOLOGIC RELATED) ×1
PROCESSOR BAHA 5 SILVER NS (OTOLOGIC RELATED) IMPLANT
PUNCH BIOPSY 4MM (MISCELLANEOUS)
PUNCH BIOPSY 4MM COCHLEAR (MISCELLANEOUS) IMPLANT
PUNCH BIOPSY 5MM COCHLEAR (MISCELLANEOUS) ×1 IMPLANT
PUNCH BIOPSY DERMAL 6MM STRL (MISCELLANEOUS) IMPLANT
PUNCH BIOPSY DISP 4 (MISCELLANEOUS) IMPLANT
SUT BONE WAX W31G (SUTURE) IMPLANT
SUT CHROMIC 3 0 PS 2 (SUTURE) IMPLANT
SUT ETHILON 4 0 PS 2 18 (SUTURE) ×2 IMPLANT
SUT VIC AB 4-0 P-3 18X BRD (SUTURE) ×1 IMPLANT
SUT VIC AB 4-0 P3 18 (SUTURE) ×1
TOWEL GREEN STERILE FF (TOWEL DISPOSABLE) IMPLANT
TRAY ENT MC OR (CUSTOM PROCEDURE TRAY) ×2 IMPLANT
WIPE INSTRUMENT VISIWIPE 73X73 (MISCELLANEOUS) IMPLANT

## 2019-01-26 NOTE — Anesthesia Postprocedure Evaluation (Signed)
Anesthesia Post Note  Patient: [name] [name]  Procedure(s) Performed: BONE ANCHORED HEARING AID (BAHA) IMPLANT (Left Ear)     Patient location during evaluation: PACU Anesthesia Type: General Level of consciousness: awake and alert Pain management: pain level controlled Vital Signs Assessment: post-procedure vital signs reviewed and stable Respiratory status: spontaneous breathing, nonlabored ventilation, respiratory function stable and patient connected to nasal cannula oxygen Cardiovascular status: blood pressure returned to baseline and stable Postop Assessment: no apparent nausea or vomiting Anesthetic complications: no    Last Vitals:  Vitals:   01/26/19 0937 01/26/19 0947  BP: 131/66 (!) 133/36  Pulse: 67   Resp: 12   Temp: 36.5 C   SpO2: 99% 100%    Last Pain:  Vitals:   01/26/19 0947  TempSrc:   PainSc: 0-No pain                 Tiajuana Amass

## 2019-01-26 NOTE — Discharge Instructions (Signed)
There is no care necessary at the surgical site.  The dressing and healing Will stay in place until the first postop visit in 1 week.  Keep the area clean and dry.  OK to use tylenol and/or motrin for pain.   General Anesthesia, Adult, Care After This sheet gives you information about how to care for yourself after your procedure. Your health care provider may also give you more specific instructions. If you have problems or questions, contact your health care provider. What can I expect after the procedure? After the procedure, the following side effects are common:  Pain or discomfort at the IV site.  Nausea.  Vomiting.  Sore throat.  Trouble concentrating.  Feeling cold or chills.  Weak or tired.  Sleepiness and fatigue.  Soreness and body aches. These side effects can affect parts of the body that were not involved in surgery. Follow these instructions at home:  For at least 24 hours after the procedure:  Have a responsible adult stay with you. It is important to have someone help care for you until you are awake and alert.  Rest as needed.  Do not: ? Participate in activities in which you could fall or become injured. ? Drive. ? Use heavy machinery. ? Drink alcohol. ? Take sleeping pills or medicines that cause drowsiness. ? Make important decisions or sign legal documents. ? Take care of children on your own. Eating and drinking  Follow any instructions from your health care provider about eating or drinking restrictions.  When you feel hungry, start by eating small amounts of foods that are soft and easy to digest (bland), such as toast. Gradually return to your regular diet.  Drink enough fluid to keep your urine pale yellow.  If you vomit, rehydrate by drinking water, juice, or clear broth. General instructions  If you have sleep apnea, surgery and certain medicines can increase your risk for breathing problems. Follow instructions from your health  care provider about wearing your sleep device: ? Anytime you are sleeping, including during daytime naps. ? While taking prescription pain medicines, sleeping medicines, or medicines that make you drowsy.  Return to your normal activities as told by your health care provider. Ask your health care provider what activities are safe for you.  Take over-the-counter and prescription medicines only as told by your health care provider.  If you smoke, do not smoke without supervision.  Keep all follow-up visits as told by your health care provider. This is important. Contact a health care provider if:  You have nausea or vomiting that does not get better with medicine.  You cannot eat or drink without vomiting.  You have pain that does not get better with medicine.  You are unable to pass urine.  You develop a skin rash.  You have a fever.  You have redness around your IV site that gets worse. Get help right away if:  You have difficulty breathing.  You have chest pain.  You have blood in your urine or stool, or you vomit blood. Summary  After the procedure, it is common to have a sore throat or nausea. It is also common to feel tired.  Have a responsible adult stay with you for the first 24 hours after general anesthesia. It is important to have someone help care for you until you are awake and alert.  When you feel hungry, start by eating small amounts of foods that are soft and easy to digest (bland), such as toast.  Gradually return to your regular diet.  Drink enough fluid to keep your urine pale yellow.  Return to your normal activities as told by your health care provider. Ask your health care provider what activities are safe for you. This information is not intended to replace advice given to you by your health care provider. Make sure you discuss any questions you have with your health care provider. Document Released: 06/28/2000 Document Revised: 03/25/2017 Document  Reviewed: 11/05/2016 Elsevier Patient Education  2020 [employer].

## 2019-01-26 NOTE — Interval H&P Note (Signed)
History and Physical Interval Note:  01/26/2019 7:17 AM  [name] [name]  has presented today for surgery, with the diagnosis of H90.A11 Conductive hearing loss of right ear with restricted hearing of left ear.  The various methods of treatment have been discussed with the patient and family. After consideration of risks, benefits and other options for treatment, the patient has consented to  Procedure(s): BONE ANCHORED HEARING AID (BAHA) IMPLANT (N/A) as a surgical intervention.  The patient's history has been reviewed, patient examined, no change in status, stable for surgery.  I have reviewed the patient's chart and labs.  Questions were answered to the patient's satisfaction.     [name]

## 2019-01-26 NOTE — Transfer of Care (Signed)
Immediate Anesthesia Transfer of Care Note  Patient: [name] [name]  Procedure(s) Performed: BONE ANCHORED HEARING AID (BAHA) IMPLANT (Left Ear)  Patient Location: PACU  Anesthesia Type:General  Level of Consciousness: awake and alert   Airway & Oxygen Therapy: Patient Spontanous Breathing  Post-op Assessment: Report given to RN, Post -op Vital signs reviewed and stable and Patient moving all extremities X 4  Post vital signs: Reviewed and stable  Last Vitals:  Vitals Value Taken Time  BP 130/73 01/26/19 0852  Temp    Pulse 83 01/26/19 0853  Resp 16 01/26/19 0853  SpO2 98 % 01/26/19 0853  Vitals shown include unvalidated device data.  Last Pain:  Vitals:   01/26/19 0553  TempSrc: Tympanic  PainSc: 0-No pain         Complications: No apparent anesthesia complications

## 2019-01-26 NOTE — Anesthesia Procedure Notes (Signed)
Procedure Name: Intubation Date/Time: 01/26/2019 7:39 AM Performed by: Harden Mo, CRNA Pre-anesthesia Checklist: Patient identified, Emergency Drugs available, Suction available and Patient being monitored Patient Re-evaluated:Patient Re-evaluated prior to induction Oxygen Delivery Method: Circle System Utilized Preoxygenation: Pre-oxygenation with 100% oxygen Induction Type: IV induction Ventilation: Mask ventilation without difficulty Laryngoscope Size: Miller and 2 Grade View: Grade I Tube type: Oral Tube size: 7.0 mm Number of attempts: 1 Airway Equipment and Method: Stylet and Oral airway Placement Confirmation: ETT inserted through vocal cords under direct vision,  positive ETCO2 and breath sounds checked- equal and bilateral Secured at: 22 cm Tube secured with: Tape Dental Injury: Teeth and Oropharynx as per pre-operative assessment

## 2019-01-26 NOTE — Op Note (Signed)
OPERATIVE REPORT  DATE OF SURGERY: 01/26/2019  PATIENT:  [name] [name],  68 y.o. female  PRE-OPERATIVE DIAGNOSIS:  H90.A11 Conductive hearing loss of right ear with restricted hearing of left ear  POST-OPERATIVE DIAGNOSIS:  H90.A11 Conductive hearing loss of right ear with restricted hearing of left ear  PROCEDURE:  Procedure(s): BONE ANCHORED HEARING AID (BAHA) IMPLANT  SURGEON:  Beckie Salts, MD  ASSISTANTS: None  ANESTHESIA:   General   EBL: 30 ml  DRAINS: None  LOCAL MEDICATIONS USED: 1% Xylocaine with epinephrine  SPECIMEN:  none  COUNTS:  Correct  PROCEDURE DETAILS: The patient was taken to the operating room and placed on the operating table in the supine position. Following induction of general endotracheal anesthesia, the left postauricular scalp was shaved and prepped and draped in a standard fashion.  The proposed implant site was located about 55 mm posterior to the external auditory canal.  An incision was marked about 1 cm anterior to that in a vertical fashion about 30 mm in length.  The skin depth was measured using a needle and hemostat and measured at 4 mm.  8 mm implant was then determined to be the proper fit.  Local anesthetic was infiltrated into the surgical site.  #15 scalpel was used to incise the skin down to the periosteum.  Bipolar cautery was used for hemostasis.  Periosteum was elevated posteriorly exposing the proposed implant site.  A needle was then placed through the skin at the implant site and used to mark the implant site on the bone.  Self-retaining retractor was used for the remainder of the case.  The cochlear [facility] a drill was then used initially at a setting of 2000 RPM to create the initial drill hole down to 3 mm.  There is no exposed nerve or so the hole was enlarged down to 4 mm.  The countersink was then used as well.  The 8 mm implant was then placed and a setting of 50 RPM.  The implant was well seated and was in a good vertical  position.  The wound was irrigated with saline.  Incision was reapproximated first with interrupted 3-0 chromic on the deeper layers and then running subcuticular 3-0 chromic.  Dermabond was used on the skin.  A dermal punch was then used to create the circular hole for the implant abutment.  Abutment was exposed and the skin was tucked down underneath.  There was a nice tight fit.  Skin was cleaned and a healing pad and healing Were placed.  Patient was awakened extubated and transferred to recovery in stable condition.    PATIENT DISPOSITION:  To PACU, stable

## 2019-01-28 ENCOUNTER — Encounter: Payer: Self-pay | Admitting: Family Medicine

## 2019-01-29 ENCOUNTER — Encounter (HOSPITAL_COMMUNITY): Payer: Self-pay | Admitting: Otolaryngology

## 2019-01-30 NOTE — Telephone Encounter (Signed)
TA-I see that per [place] PMP this medication was prescribed last for #20 by Lucien Mons, MD on 10.23.20/prior to that she last had this on 1.10.20/it was Emmarae Cowdery/C'ed by Glenard Haring stating "no longer needs"/plz advise/thx dmf

## 2019-02-15 DIAGNOSIS — H9202 Otalgia, left ear: Secondary | ICD-10-CM | POA: Insufficient documentation

## 2019-02-19 ENCOUNTER — Other Ambulatory Visit: Payer: Self-pay | Admitting: Family Medicine

## 2019-04-26 ENCOUNTER — Encounter: Payer: Self-pay | Admitting: Family Medicine

## 2019-05-07 ENCOUNTER — Telehealth: Payer: Self-pay | Admitting: Behavioral Health

## 2019-05-07 NOTE — Telephone Encounter (Signed)
Called and spoke with patient regarding her next Prolia injection, due on 05/10/2019. Informed patient that insurance summary of benefits came back as terminated on 04/05/2019. Patient voiced that she now has new insurance information [facility]) that she will update through MyChart or at her next appointment. Also, she informed RN that she is transferring her to care to [place] this month and would like to first speak with the provider there, that's taking over her care before getting the Prolia injection.  RN reached out to [employer], [place] at [facility] and provided the above information for management of Prolia.

## 2019-05-08 NOTE — Telephone Encounter (Signed)
[employer] insurance card scanned into chart

## 2019-05-28 ENCOUNTER — Encounter: Payer: Self-pay | Admitting: Family Medicine

## 2019-05-28 ENCOUNTER — Ambulatory Visit (INDEPENDENT_AMBULATORY_CARE_PROVIDER_SITE_OTHER): Payer: Medicare PPO | Admitting: Family Medicine

## 2019-05-28 ENCOUNTER — Other Ambulatory Visit: Payer: Self-pay

## 2019-05-28 VITALS — BP 136/74 | HR 90 | Temp 98.4°F | Ht <= 58 in | Wt 169.0 lb

## 2019-05-28 DIAGNOSIS — M810 Age-related osteoporosis without current pathological fracture: Secondary | ICD-10-CM | POA: Diagnosis not present

## 2019-05-28 DIAGNOSIS — E782 Mixed hyperlipidemia: Secondary | ICD-10-CM

## 2019-05-28 NOTE — Assessment & Plan Note (Signed)
Reviewed prior labs and borderline for treatment. Repeat today. Pt to work on lifestyle and repeat in 3-6 months if still elevated

## 2019-05-28 NOTE — Assessment & Plan Note (Signed)
Calcium was a little low on last check. Repeat Ca. Plan Prolia if normalized. Continue Ca supplement

## 2019-05-28 NOTE — Patient Instructions (Addendum)
#   Osteoporosis - will check calcium today - plan for scheduling nurse visit for Prolia injection

## 2019-05-28 NOTE — Progress Notes (Signed)
Subjective:     [name] [name] is a 69 y.o. female presenting for New Patient (Initial Visit)     HPI  #osteoporosis - is suppose to do the Prolia injection - has been in touch with Charmaine - last bone density scan put her into osteoporosis - fell in January and cracked a rib - had been on the border with the number - started Prolia a few years ago - then restarted this round 7-8 months ago  #weight gain - lowest weight after bariatric surgery was 130 - has been gaining slowly over the last 3 years - decreased activity with covid and a prior rib injury  Review of Systems   Social History   Tobacco Use  Smoking Status Never Smoker  Smokeless Tobacco Never Used        Objective:    BP Readings from Last 3 Encounters:  05/28/19 136/74  01/26/19 (!) 133/36  01/18/19 136/89   Wt Readings from Last 3 Encounters:  05/28/19 169 lb (76.7 kg)  01/26/19 159 lb 7 oz (72.3 kg)  01/18/19 159 lb 7 oz (72.3 kg)    BP 136/74 (BP Location: Left Arm, Patient Position: Sitting, Cuff Size: Normal)   Pulse 90   Temp 98.4 F (36.9 C) (Temporal)   Ht 4\' 10"  (1.473 m)   Wt 169 lb (76.7 kg)   SpO2 98%   BMI 35.32 kg/m    Physical Exam Constitutional:      General: She is not in acute distress.    Appearance: She is well-developed. She is not diaphoretic.  HENT:     Right Ear: External ear normal.     Left Ear: External ear normal.     Nose: Nose normal.  Eyes:     Conjunctiva/sclera: Conjunctivae normal.  Cardiovascular:     Rate and Rhythm: Normal rate.  Pulmonary:     Effort: Pulmonary effort is normal.  Musculoskeletal:     Cervical back: Neck supple.  Skin:    General: Skin is warm and dry.     Capillary Refill: Capillary refill takes less than 2 seconds.  Neurological:     Mental Status: She is alert. Mental status is at baseline.  Psychiatric:        Mood and Affect: Mood normal.        Behavior: Behavior normal.       The 10-year ASCVD  risk score Mikey Bussing DC Jr., et al., 2013) is: 7%   Values used to calculate the score:     Age: 29 years     Sex: Female     Is Non-Hispanic African American: No     Diabetic: No     Tobacco smoker: No     Systolic Blood Pressure: [id number] mmHg     Is BP treated: No     HDL Cholesterol: 76 mg/dL     Total Cholesterol: 201 mg/dL     Assessment & Plan:   Problem List Items Addressed This Visit      Musculoskeletal and Integument   Osteoporosis - Primary    Calcium was a little low on last check. Repeat Ca. Plan Prolia if normalized. Continue Ca supplement      Relevant Orders   Basic metabolic panel     Other   Mixed hyperlipidemia    Reviewed prior labs and borderline for treatment. Repeat today. Pt to work on lifestyle and repeat in 3-6 months if still elevated      Relevant  Orders   Lipid panel       Return in about 6 months (around 11/25/2019).  Lesleigh Noe, MD

## 2019-05-29 ENCOUNTER — Encounter: Payer: Self-pay | Admitting: Family Medicine

## 2019-05-29 LAB — BASIC METABOLIC PANEL
BUN: 21 mg/dL (ref 6–23)
CO2: 28 mEq/L (ref 19–32)
Calcium: 9.2 mg/dL (ref 8.4–10.5)
Chloride: 105 mEq/L (ref 96–112)
Creatinine, Ser: 0.63 mg/dL (ref 0.40–1.20)
GFR: 94.25 mL/min (ref >60.00)
Glucose, Bld: 89 mg/dL (ref 70–99)
Potassium: 4.5 mEq/L (ref 3.5–5.1)
Sodium: 140 mEq/L (ref 135–145)

## 2019-05-29 LAB — LIPID PANEL
Cholesterol: 205 mg/dL — ABNORMAL HIGH (ref 0–200)
HDL: 65 mg/dL (ref >39.00)
LDL Cholesterol: 112 mg/dL — ABNORMAL HIGH (ref 0–99)
NonHDL: 139.79
Total CHOL/HDL Ratio: 3
Triglycerides: 137 mg/dL (ref 0.0–149.0)
VLDL: 27.4 mg/dL (ref 0.0–40.0)

## 2019-06-01 ENCOUNTER — Encounter: Payer: Self-pay | Admitting: Family Medicine

## 2019-06-01 ENCOUNTER — Ambulatory Visit: Payer: Medicare PPO | Attending: Internal Medicine

## 2019-06-01 ENCOUNTER — Telehealth: Payer: Self-pay

## 2019-06-01 DIAGNOSIS — Z23 Encounter for immunization: Secondary | ICD-10-CM | POA: Insufficient documentation

## 2019-06-01 NOTE — Progress Notes (Signed)
   Covid-19 Vaccination Clinic  Name:  [name] [name]    MRN: [id number] DOB: [birth year]  06/01/2019  [name] was observed post Covid-19 immunization for 15 minutes without incidence. She was provided with Vaccine Information Sheet and instruction to access the V-Safe system.   [name] was instructed to call 911 with any severe reactions post vaccine: Marland Kitchen Difficulty breathing  . Swelling of your face and throat  . A fast heartbeat  . A bad rash all over your body  . Dizziness and weakness    Immunizations Administered    Name Date Dose VIS Date Route   Pfizer COVID-19 Vaccine 06/01/2019 12:26 PM 0.3 mL 03/16/2019 Intramuscular   Manufacturer: [place]   Lot: [id number]   [place]: [id number]

## 2019-06-01 NOTE — Telephone Encounter (Signed)
Unfortunately there is not a lot of information available for guidelines regarding timing with the vaccine.   She could delay prolia injection until after she has completed the vaccine series. This may be the safest approach, though if she delays the prolia longer I would recommend an office visit to discuss alternative treatment for bone health if she is interested since she is already a month late.   May need to consider oral bisphosphates as she waits.

## 2019-06-01 NOTE — Telephone Encounter (Signed)
LMOM for pt to c/b.  Need to schedule Prolia injection.  Pt on schedule for Covid vaccine. Ok to schedule or wait?

## 2019-06-01 NOTE — Telephone Encounter (Signed)
Discussed Prolia benefits w/pt.  Pt would owe approx. $20.  Pt understands and agrees.  She will received her Covid vaccine today and wants to do Prolia now too.  Pt scheduled for Prolia 06-07-19.

## 2019-06-07 ENCOUNTER — Ambulatory Visit (INDEPENDENT_AMBULATORY_CARE_PROVIDER_SITE_OTHER): Payer: Medicare PPO

## 2019-06-07 ENCOUNTER — Other Ambulatory Visit: Payer: Self-pay

## 2019-06-07 DIAGNOSIS — M810 Age-related osteoporosis without current pathological fracture: Secondary | ICD-10-CM | POA: Diagnosis not present

## 2019-06-07 MED ORDER — DENOSUMAB 60 MG/ML ~~LOC~~ SOSY
60.0000 mg | PREFILLED_SYRINGE | Freq: Once | SUBCUTANEOUS | Status: AC
  Administered 2019-06-07: 60 mg via SUBCUTANEOUS

## 2019-06-10 ENCOUNTER — Encounter: Payer: Self-pay | Admitting: Family Medicine

## 2019-06-11 NOTE — Telephone Encounter (Signed)
See both messages.

## 2019-06-12 ENCOUNTER — Encounter: Payer: Self-pay | Admitting: Family Medicine

## 2019-06-18 NOTE — Progress Notes (Signed)
Agree with plan and giving injection

## 2019-06-26 ENCOUNTER — Ambulatory Visit: Payer: Medicare PPO | Attending: Internal Medicine

## 2019-06-26 DIAGNOSIS — Z23 Encounter for immunization: Secondary | ICD-10-CM

## 2019-06-26 NOTE — Progress Notes (Signed)
   Covid-19 Vaccination Clinic  Name:  [name] [name]    MRN: [id number] DOB: 1950-05-21  06/26/2019  [name] was observed post Covid-19 immunization for 15 minutes without incident. She was provided with Vaccine Information Sheet and instruction to access the V-Safe system.   [name] was instructed to call 911 with any severe reactions post vaccine: Marland Kitchen Difficulty breathing  . Swelling of face and throat  . A fast heartbeat  . A bad rash all over body  . Dizziness and weakness   Immunizations Administered    Name Date Dose VIS Date Route   Pfizer COVID-19 Vaccine 06/26/2019  3:30 PM 0.3 mL 03/16/2019 Intramuscular   Manufacturer: [place]   Lot: [id number]   [place]: [id number]

## 2019-07-05 ENCOUNTER — Encounter: Payer: Self-pay | Admitting: Internal Medicine

## 2019-07-18 ENCOUNTER — Telehealth: Payer: Self-pay | Admitting: *Deleted

## 2019-07-18 NOTE — Telephone Encounter (Signed)
noted 

## 2019-07-18 NOTE — Telephone Encounter (Addendum)
Dr. Carlean Purl, This pt is coming in for a recall colonoscopy on 08-14-19.  Her PV is 07-26-19.  Please note her 2018 procedure- she did Suprep with some modified instructions from you. Is she ok to do these same instructions again?  The results were good according to the procedure note.  I am not sure if she has [employer] or [employer].  If she has medicare, is ok to use [place]- we have a coupon to help with the cost of Sutab, but not Suprep.  Thanks, [employer]

## 2019-07-18 NOTE — Telephone Encounter (Signed)
Yes - would repeat what we did and substitution w/ Sutab ok  Thanks!

## 2019-07-25 ENCOUNTER — Telehealth: Payer: Medicare PPO | Admitting: Family

## 2019-07-25 DIAGNOSIS — J209 Acute bronchitis, unspecified: Secondary | ICD-10-CM

## 2019-07-25 MED ORDER — BENZONATATE 100 MG PO CAPS: 100.0000 mg | ORAL_CAPSULE | Freq: Three times a day (TID) | ORAL | 0 refills | Status: DC | PRN

## 2019-07-25 MED ORDER — PREDNISONE 10 MG (21) PO TBPK: ORAL_TABLET | ORAL | 0 refills | Status: DC

## 2019-07-25 NOTE — Progress Notes (Signed)
We are sorry that you are not feeling well.  Here is how we plan to help!  Based on your presentation I believe you most likely have A cough due to a virus.  This is called viral bronchitis and is best treated by rest, plenty of fluids and control of the cough.  You may use Ibuprofen or Tylenol as directed to help your symptoms.     In addition you may use A non-prescription cough medication called Mucinex DM: take 2 tablets every 12 hours. and A prescription cough medication called Tessalon Perles 100mg . You may take 1-2 capsules every 8 hours as needed for your cough.  Prednisone 10 mg daily for 6 days (see taper instructions below)  Directions for 6 day taper: Day 1: 2 tablets before breakfast, 1 after both lunch & dinner and 2 at bedtime Day 2: 1 tab before breakfast, 1 after both lunch & dinner and 2 at bedtime Day 3: 1 tab at each meal & 1 at bedtime Day 4: 1 tab at breakfast, 1 at lunch, 1 at bedtime Day 5: 1 tab at breakfast & 1 tab at bedtime Day 6: 1 tab at breakfast  Please continue your allergic medications daily.    From your responses in the eVisit questionnaire you describe inflammation in the upper respiratory tract which is causing a significant cough.  This is commonly called Bronchitis and has four common causes:    Allergies  Viral Infections  Acid Reflux  Bacterial Infection Allergies, viruses and acid reflux are treated by controlling symptoms or eliminating the cause. An example might be a cough caused by taking certain blood pressure medications. You stop the cough by changing the medication. Another example might be a cough caused by acid reflux. Controlling the reflux helps control the cough.  USE OF BRONCHODILATOR ("RESCUE") INHALERS: There is a risk from using your bronchodilator too frequently.  The risk is that over-reliance on a medication which only relaxes the muscles surrounding the breathing tubes can reduce the effectiveness of medications prescribed  to reduce swelling and congestion of the tubes themselves.  Although you feel brief relief from the bronchodilator inhaler, your asthma may actually be worsening with the tubes becoming more swollen and filled with mucus.  This can delay other crucial treatments, such as oral steroid medications. If you need to use a bronchodilator inhaler daily, several times per day, you should discuss this with your provider.  There are probably better treatments that could be used to keep your asthma under control.     HOME CARE . Only take medications as instructed by your medical team. . Complete the entire course of an antibiotic. . Drink plenty of fluids and get plenty of rest. . Avoid close contacts especially the very young and the elderly . Cover your mouth if you cough or cough into your sleeve. . Always remember to wash your hands . A steam or ultrasonic humidifier can help congestion.   GET HELP RIGHT AWAY IF: . You develop worsening fever. . You become short of breath . You cough up blood. . Your symptoms persist after you have completed your treatment plan MAKE SURE YOU   Understand these instructions.  Will watch your condition.  Will get help right away if you are not doing well or get worse.  Your e-visit answers were reviewed by a board certified advanced clinical practitioner to complete your personal care plan.  Depending on the condition, your plan could have included both over the  counter or prescription medications. If there is a problem please reply  once you have received a response from your provider. Your safety is important to [place].  If you have drug allergies check your prescription carefully.    You can use MyChart to ask questions about today's visit, request a non-urgent call back, or ask for a work or school excuse for 24 hours related to this e-Visit. If it has been greater than 24 hours you will need to follow up with your provider, or enter a new e-Visit to address  those concerns. You will get an e-mail in the next two days asking about your experience.  I hope that your e-visit has been valuable and will speed your recovery. Thank you for using e-visits.  Approximately 5 minutes was spent documenting and reviewing patient's chart.

## 2019-07-26 ENCOUNTER — Ambulatory Visit (AMBULATORY_SURGERY_CENTER): Payer: Self-pay | Admitting: *Deleted

## 2019-07-26 ENCOUNTER — Other Ambulatory Visit: Payer: Self-pay

## 2019-07-26 VITALS — Temp 97.1°F | Ht <= 58 in | Wt 164.0 lb

## 2019-07-26 DIAGNOSIS — Z86010 Personal history of colonic polyps: Secondary | ICD-10-CM

## 2019-07-26 DIAGNOSIS — Z800 Family history of malignant neoplasm of digestive organs: Secondary | ICD-10-CM

## 2019-07-26 MED ORDER — SUTAB 1479-225-188 MG PO TABS: 24.0000 | ORAL_TABLET | ORAL | 0 refills | Status: DC

## 2019-07-26 NOTE — Progress Notes (Signed)
Completed covid vaccines 06-26-19  sutab per gessner   No egg or soy allergy known to patient  No issues with past sedation with any surgeries  or procedures, no intubation problems  No diet pills per patient No home 02 use per patient  No blood thinners per patient  Pt denies issues with constipation  No A fib or A flutter  EMMI video sent to pt's e mail   Some issues with Prolia- tooth crumbled and jaw pain-   Due to the COVID-19 pandemic we are asking patients to follow these guidelines. Please only bring one care partner. Please be aware that your care partner may wait in the car in the parking lot or if they feel like they will be too hot to wait in the car, they may wait in the lobby on the 4th floor. All care partners are required to wear a mask the entire time (we do not have any that we can provide them), they need to practice social distancing, and we will do a Covid check for all patient's and care partners when you arrive. Also we will check their temperature and your temperature. If the care partner waits in their car they need to stay in the parking lot the entire time and we will call them on their cell phone when the patient is ready for discharge so they can bring the car to the front of the building. Also all patient's will need to wear a mask into building.

## 2019-08-06 ENCOUNTER — Telehealth: Payer: Self-pay | Admitting: Internal Medicine

## 2019-08-06 DIAGNOSIS — Z86010 Personal history of colonic polyps: Secondary | ICD-10-CM

## 2019-08-06 DIAGNOSIS — Z800 Family history of malignant neoplasm of digestive organs: Secondary | ICD-10-CM

## 2019-08-06 MED ORDER — SUTAB 1479-225-188 MG PO TABS: 1.0000 | ORAL_TABLET | ORAL | 0 refills | Status: DC

## 2019-08-06 NOTE — Telephone Encounter (Signed)
Pt is scheduled for a colon 08/14/19 and stated that [facility] [address] has not received [place] script.

## 2019-08-06 NOTE — Telephone Encounter (Signed)
Patient Sutab rx resent to her pharmacy per pt's request. Called pt no answer, left message to let her know the rx was sent today.

## 2019-08-14 ENCOUNTER — Ambulatory Visit (AMBULATORY_SURGERY_CENTER): Payer: Medicare PPO | Admitting: Internal Medicine

## 2019-08-14 ENCOUNTER — Encounter: Payer: Self-pay | Admitting: Internal Medicine

## 2019-08-14 VITALS — BP 139/77 | HR 69 | Temp 96.9°F | Resp 13 | Ht <= 58 in | Wt 164.0 lb

## 2019-08-14 DIAGNOSIS — Z86010 Personal history of colonic polyps: Secondary | ICD-10-CM | POA: Diagnosis not present

## 2019-08-14 MED ORDER — SODIUM CHLORIDE 0.9 % IV SOLN: 500.0000 mL | Freq: Once | INTRAVENOUS | Status: DC

## 2019-08-14 NOTE — Progress Notes (Signed)
Report to PACU, RN, vss, BBS= Clear.  

## 2019-08-14 NOTE — Op Note (Signed)
[facility] Patient Name: [name] [name] Procedure Date: 08/14/2019 7:57 AM MRN: [id number] Endoscopist: Gatha Mayer , MD Age: 69 Referring MD:  Date of Birth: 02-Mar-1951 Gender: Female Account #: [id number] Procedure:                Colonoscopy Indications:              Surveillance: Personal history of adenomatous                            polyps on last colonoscopy 3 years ago Medicines:                Propofol per Anesthesia, Monitored Anesthesia Care Procedure:                Pre-Anesthesia Assessment:                           - Prior to the procedure, a History and Physical                            was performed, and patient medications and                            allergies were reviewed. The patient's tolerance of                            previous anesthesia was also reviewed. The risks                            and benefits of the procedure and the sedation                            options and risks were discussed with the patient.                            All questions were answered, and informed consent                            was obtained. Prior Anticoagulants: The patient has                            taken no previous anticoagulant or antiplatelet                            agents. ASA Grade Assessment: II - A patient with                            mild systemic disease. After reviewing the risks                            and benefits, the patient was deemed in                            satisfactory condition to undergo the procedure.  After obtaining informed consent, the colonoscope                            was passed under direct vision. Throughout the                            procedure, the patient's blood pressure, pulse, and                            oxygen saturations were monitored continuously. The                            Colonoscope was introduced through the anus and   advanced to the the cecum, identified by                            appendiceal orifice and ileocecal valve. The                            colonoscopy was performed without difficulty. The                            patient tolerated the procedure well. The quality                            of the bowel preparation was good. The ileocecal                            valve, appendiceal orifice, and rectum were                            photographed. The bowel preparation used was                            Miralax via split dose instruction. Scope In: 8:11:00 AM Scope Out: 8:34:17 AM Scope Withdrawal Time: 0 hours 18 minutes 2 seconds  Total Procedure Duration: 0 hours 23 minutes 17 seconds  Findings:                 The perianal and digital rectal examinations were                            normal.                           Multiple diverticula were found in the sigmoid                            colon.                           The exam was otherwise without abnormality on                            direct and retroflexion views. Complications:  No immediate complications. Estimated Blood Loss:     Estimated blood loss: none. Impression:               - Diverticulosis in the sigmoid colon.                           - The examination was otherwise normal on direct                            and retroflexion views.                           - No specimens collected.                           - Personal history of colonic polyps. 3 sub cm                            adenomas and FHx CRCA- brother 53 Recommendation:           - Patient has a contact number available for                            emergencies. The signs and symptoms of potential                            delayed complications were discussed with the                            patient. Return to normal activities tomorrow.                            Written discharge instructions were provided to the                             patient.                           - Resume previous diet.                           - Continue present medications.                           - Repeat colonoscopy in 5 years for surveillance. Gatha Mayer, MD 08/14/2019 8:48:27 AM This report has been signed electronically.

## 2019-08-14 NOTE — Progress Notes (Signed)
Pt's states no medical or surgical changes since previsit or office visit. 

## 2019-08-14 NOTE — Patient Instructions (Addendum)
No polyps today so your next routine colonoscopy should be in 5 years - 2026.  I appreciate the opportunity to care for you. Gatha Mayer, MD, FACG  YOU HAD AN ENDOSCOPIC PROCEDURE TODAY AT [facility]:   Refer to the procedure report that was given to you for any specific questions about what was found during the examination.  If the procedure report does not answer your questions, please call your gastroenterologist to clarify.  If you requested that your care partner not be given the details of your procedure findings, then the procedure report has been included in a sealed envelope for you to review at your convenience later.  **Handout given on Diverticulosis**  YOU SHOULD EXPECT: Some feelings of bloating in the abdomen. Passage of more gas than usual.  Walking can help get rid of the air that was put into your GI tract during the procedure and reduce the bloating. If you had a lower endoscopy (such as a colonoscopy or flexible sigmoidoscopy) you may notice spotting of blood in your stool or on the toilet paper. If you underwent a bowel prep for your procedure, you may not have a normal bowel movement for a few days.  Please Note:  You might notice some irritation and congestion in your nose or some drainage.  This is from the oxygen used during your procedure.  There is no need for concern and it should clear up in a day or so.  SYMPTOMS TO REPORT IMMEDIATELY:   Following lower endoscopy (colonoscopy or flexible sigmoidoscopy):  Excessive amounts of blood in the stool  Significant tenderness or worsening of abdominal pains  Swelling of the abdomen that is new, acute  Fever of 100F or higher  For urgent or emergent issues, a gastroenterologist can be reached at any hour by calling [phone]. Do not use MyChart messaging for urgent concerns.    DIET:  We do recommend a small meal at first, but then you may proceed to your regular diet.  Drink plenty of fluids  but you should avoid alcoholic beverages for 24 hours.  ACTIVITY:  You should plan to take it easy for the rest of today and you should NOT DRIVE or use heavy machinery until tomorrow (because of the sedation medicines used during the test).    FOLLOW UP: Our staff will call the number listed on your records 48-72 hours following your procedure to check on you and address any questions or concerns that you may have regarding the information given to you following your procedure. If we do not reach you, we will leave a message.  We will attempt to reach you two times.  During this call, we will ask if you have developed any symptoms of COVID 19. If you develop any symptoms (ie: fever, flu-like symptoms, shortness of breath, cough etc.) before then, please call [phone].  If you test positive for Covid 19 in the 2 weeks post procedure, please call and report this information to [place].    If any biopsies were taken you will be contacted by phone or by letter within the next 1-3 weeks.  Please call us at [phone] if you have not heard about the biopsies in 3 weeks.    SIGNATURES/CONFIDENTIALITY: You and/or your care partner have signed paperwork which will be entered into your electronic medical record.  These signatures attest to the fact that that the information above on your After Visit Summary has been reviewed and is understood.  Full responsibility of the confidentiality of this discharge information lies with you and/or your care-partner.

## 2019-08-16 ENCOUNTER — Telehealth: Payer: Self-pay

## 2019-08-16 NOTE — Telephone Encounter (Signed)
  Follow up Call-  Call back number 08/14/2019  Post procedure Call Back phone  # [phone]  Permission to leave phone message Yes  Some recent data might be hidden     Patient questions:  Do you have a fever, pain , or abdominal swelling? No. Pain Score  0 *  Have you tolerated food without any problems? Yes.    Have you been able to return to your normal activities? Yes.    Do you have any questions about your discharge instructions: Diet   No. Medications  No. Follow up visit  No.  Do you have questions or concerns about your Care? No.  Actions: * If pain score is 4 or above: No action needed, pain <4. 1. Have you developed a fever since your procedure? no  2.   Have you had an respiratory symptoms (SOB or cough) since your procedure? no  3.   Have you tested positive for COVID 19 since your procedure no  4.   Have you had any family members/close contacts diagnosed with the COVID 19 since your procedure?  no   If yes to any of these questions please route to Joylene John, RN and Erenest Rasher, RN

## 2019-09-28 DIAGNOSIS — H6123 Impacted cerumen, bilateral: Secondary | ICD-10-CM | POA: Diagnosis not present

## 2019-12-03 DIAGNOSIS — Z809 Family history of malignant neoplasm, unspecified: Secondary | ICD-10-CM | POA: Diagnosis not present

## 2019-12-03 DIAGNOSIS — M810 Age-related osteoporosis without current pathological fracture: Secondary | ICD-10-CM | POA: Diagnosis not present

## 2019-12-03 DIAGNOSIS — Z8249 Family history of ischemic heart disease and other diseases of the circulatory system: Secondary | ICD-10-CM | POA: Diagnosis not present

## 2019-12-03 DIAGNOSIS — Z6841 Body Mass Index (BMI) 40.0 and over, adult: Secondary | ICD-10-CM | POA: Diagnosis not present

## 2019-12-03 DIAGNOSIS — M1990 Unspecified osteoarthritis, unspecified site: Secondary | ICD-10-CM | POA: Diagnosis not present

## 2019-12-03 DIAGNOSIS — Z791 Long term (current) use of non-steroidal anti-inflammatories (NSAID): Secondary | ICD-10-CM | POA: Diagnosis not present

## 2019-12-03 DIAGNOSIS — K5900 Constipation, unspecified: Secondary | ICD-10-CM | POA: Diagnosis not present

## 2019-12-03 DIAGNOSIS — G8929 Other chronic pain: Secondary | ICD-10-CM | POA: Diagnosis not present

## 2020-01-26 ENCOUNTER — Ambulatory Visit: Payer: Medicare PPO

## 2020-01-30 ENCOUNTER — Encounter: Payer: Self-pay | Admitting: Family Medicine

## 2020-01-30 ENCOUNTER — Other Ambulatory Visit: Payer: Self-pay

## 2020-01-30 ENCOUNTER — Ambulatory Visit (INDEPENDENT_AMBULATORY_CARE_PROVIDER_SITE_OTHER): Payer: Medicare PPO

## 2020-01-30 DIAGNOSIS — Z23 Encounter for immunization: Secondary | ICD-10-CM

## 2020-02-03 ENCOUNTER — Encounter: Payer: Self-pay | Admitting: Family Medicine

## 2020-02-05 ENCOUNTER — Encounter: Payer: Medicare PPO | Admitting: Family Medicine

## 2020-02-11 ENCOUNTER — Encounter: Payer: Medicare PPO | Admitting: Family Medicine

## 2020-02-13 ENCOUNTER — Ambulatory Visit (INDEPENDENT_AMBULATORY_CARE_PROVIDER_SITE_OTHER): Payer: Medicare PPO

## 2020-02-13 DIAGNOSIS — Z0000 Encounter for general adult medical examination without abnormal findings: Secondary | ICD-10-CM | POA: Diagnosis not present

## 2020-02-13 NOTE — Progress Notes (Signed)
PCP notes:  Health Maintenance: Pneumococcal 23- due Mammogram- due Dexa- due Tdap- insurance/financial   Abnormal Screenings: none   Patient concerns: Right leg pain onset 1 year ago Losing teeth due to Prolia treatment Balance issues    Nurse concerns: none   Next PCP appt.: 02/18/2020 @ 10:40 am

## 2020-02-13 NOTE — Patient Instructions (Signed)
[name] [name] , Thank you for taking time to come for your Medicare Wellness Visit. I appreciate your ongoing commitment to your health goals. Please review the following plan we discussed and let me know if I can assist you in the future.   Screening recommendations/referrals: Colonoscopy: Up to date, completed 08/14/2019, due 08/2024 Mammogram: due, will call and set this up soon  Bone Density: due, will call and set this up soon  Recommended yearly ophthalmology/optometry visit for glaucoma screening and checkup Recommended yearly dental visit for hygiene and checkup  Vaccinations: Influenza vaccine: Up to date, completed 01/30/2020, due 11/2020 Pneumococcal vaccine: due, will get this at upcoming visit  Tdap vaccine: decline-insurance, check with the health department about receiving this also Shingles vaccine: due, check with your insurance regarding coverage/cost if interested   Covid-19:Completed series  Advanced directives: Please bring a copy of your POA (Power of [place]) and/or Living Will to your next appointment.   Conditions/risks identified: hypertension  Next appointment: Follow up in one year for your annual wellness visit    Preventive Care 31 Years and Older, Female Preventive care refers to lifestyle choices and visits with your health care provider that can promote health and wellness. What does preventive care include?  A yearly physical exam. This is also called an annual well check.  Dental exams once or twice a year.  Routine eye exams. Ask your health care provider how often you should have your eyes checked.  Personal lifestyle choices, including:  Daily care of your teeth and gums.  Regular physical activity.  Eating a healthy diet.  Avoiding tobacco and drug use.  Limiting alcohol use.  Practicing safe sex.  Taking low-dose aspirin every day.  Taking vitamin and mineral supplements as recommended by your health care provider. What happens  during an annual well check? The services and screenings done by your health care provider during your annual well check will depend on your age, overall health, lifestyle risk factors, and family history of disease. Counseling  Your health care provider may ask you questions about your:  Alcohol use.  Tobacco use.  Drug use.  Emotional well-being.  Home and relationship well-being.  Sexual activity.  Eating habits.  History of falls.  Memory and ability to understand (cognition).  Work and work Statistician.  Reproductive health. Screening  You may have the following tests or measurements:  Height, weight, and BMI.  Blood pressure.  Lipid and cholesterol levels. These may be checked every 5 years, or more frequently if you are over 66 years old.  Skin check.  Lung cancer screening. You may have this screening every year starting at age 27 if you have a 30-pack-year history of smoking and currently smoke or have quit within the past 15 years.  Fecal occult blood test (FOBT) of the stool. You may have this test every year starting at age 44.  Flexible sigmoidoscopy or colonoscopy. You may have a sigmoidoscopy every 5 years or a colonoscopy every 10 years starting at age 66.  Hepatitis C blood test.  Hepatitis B blood test.  Sexually transmitted disease (STD) testing.  Diabetes screening. This is done by checking your blood sugar (glucose) after you have not eaten for a while (fasting). You may have this done every 1-3 years.  Bone density scan. This is done to screen for osteoporosis. You may have this done starting at age 66.  Mammogram. This may be done every 1-2 years. Talk to your health care provider about how  often you should have regular mammograms. Talk with your health care provider about your test results, treatment options, and if necessary, the need for more tests. Vaccines  Your health care provider may recommend certain vaccines, such  as:  Influenza vaccine. This is recommended every year.  Tetanus, diphtheria, and acellular pertussis (Tdap, Td) vaccine. You may need a Td booster every 10 years.  Zoster vaccine. You may need this after age 81.  Pneumococcal 13-valent conjugate (PCV13) vaccine. One dose is recommended after age 69.  Pneumococcal polysaccharide (PPSV23) vaccine. One dose is recommended after age 41. Talk to your health care provider about which screenings and vaccines you need and how often you need them. This information is not intended to replace advice given to you by your health care provider. Make sure you discuss any questions you have with your health care provider. Document Released: 04/18/2015 Document Revised: 12/10/2015 Document Reviewed: 01/21/2015 Elsevier Interactive Patient Education  2017 [place] Prevention in the Home Falls can cause injuries. They can happen to people of all ages. There are many things you can do to make your home safe and to help prevent falls. What can I do on the outside of my home?  Regularly fix the edges of walkways and driveways and fix any cracks.  Remove anything that might make you trip as you walk through a door, such as a raised step or threshold.  Trim any bushes or trees on the path to your home.  Use bright outdoor lighting.  Clear any walking paths of anything that might make someone trip, such as rocks or tools.  Regularly check to see if handrails are loose or broken. Make sure that both sides of any steps have handrails.  Any raised decks and porches should have guardrails on the edges.  Have any leaves, snow, or ice cleared regularly.  Use sand or salt on walking paths during winter.  Clean up any spills in your garage right away. This includes oil or grease spills. What can I do in the bathroom?  Use night lights.  Install grab bars by the toilet and in the tub and shower. Do not use towel bars as grab bars.  Use  non-skid mats or decals in the tub or shower.  If you need to sit down in the shower, use a plastic, non-slip stool.  Keep the floor dry. Clean up any water that spills on the floor as soon as it happens.  Remove soap buildup in the tub or shower regularly.  Attach bath mats securely with double-sided non-slip rug tape.  Do not have throw rugs and other things on the floor that can make you trip. What can I do in the bedroom?  Use night lights.  Make sure that you have a light by your bed that is easy to reach.  Do not use any sheets or blankets that are too big for your bed. They should not hang down onto the floor.  Have a firm chair that has side arms. You can use this for support while you get dressed.  Do not have throw rugs and other things on the floor that can make you trip. What can I do in the kitchen?  Clean up any spills right away.  Avoid walking on wet floors.  Keep items that you use a lot in easy-to-reach places.  If you need to reach something above you, use a strong step stool that has a grab bar.  Keep electrical  cords out of the way.  Do not use floor polish or wax that makes floors slippery. If you must use wax, use non-skid floor wax.  Do not have throw rugs and other things on the floor that can make you trip. What can I do with my stairs?  Do not leave any items on the stairs.  Make sure that there are handrails on both sides of the stairs and use them. Fix handrails that are broken or loose. Make sure that handrails are as long as the stairways.  Check any carpeting to make sure that it is firmly attached to the stairs. Fix any carpet that is loose or worn.  Avoid having throw rugs at the top or bottom of the stairs. If you do have throw rugs, attach them to the floor with carpet tape.  Make sure that you have a light switch at the top of the stairs and the bottom of the stairs. If you do not have them, ask someone to add them for you. What  else can I do to help prevent falls?  Wear shoes that:  Do not have high heels.  Have rubber bottoms.  Are comfortable and fit you well.  Are closed at the toe. Do not wear sandals.  If you use a stepladder:  Make sure that it is fully opened. Do not climb a closed stepladder.  Make sure that both sides of the stepladder are locked into place.  Ask someone to hold it for you, if possible.  Clearly mark and make sure that you can see:  Any grab bars or handrails.  First and last steps.  Where the edge of each step is.  Use tools that help you move around (mobility aids) if they are needed. These include:  Canes.  Walkers.  Scooters.  Crutches.  Turn on the lights when you go into a dark area. Replace any light bulbs as soon as they burn out.  Set up your furniture so you have a clear path. Avoid moving your furniture around.  If any of your floors are uneven, fix them.  If there are any pets around you, be aware of where they are.  Review your medicines with your doctor. Some medicines can make you feel dizzy. This can increase your chance of falling. Ask your doctor what other things that you can do to help prevent falls. This information is not intended to replace advice given to you by your health care provider. Make sure you discuss any questions you have with your health care provider. Document Released: 01/16/2009 Document Revised: 08/28/2015 Document Reviewed: 04/26/2014 Elsevier Interactive Patient Education  2017 [employer].

## 2020-02-13 NOTE — Progress Notes (Signed)
Subjective:   [name] [name] is a 69 y.o. female who presents for Medicare Annual (Subsequent) preventive examination.  Review of Systems: N/A      I connected with the patient today by telephone and verified that I am speaking with the correct person using two identifiers. Location patient: home Location nurse: work Persons participating in the telephone visit: patient, nurse.   I discussed the limitations, risks, security and privacy concerns of performing an evaluation and management service by telephone and the availability of in person appointments. I also discussed with the patient that there may be a patient responsible charge related to this service. The patient expressed understanding and verbally consented to this telephonic visit.        Cardiac Risk Factors include: advanced age (>68men, >41 women);hypertension     Objective:    Today's Vitals   02/13/20 1526  PainSc: 5    There is no height or weight on file to calculate BMI.  Advanced Directives 02/13/2020 01/18/2019 06/14/2018 10/15/2016 08/02/2016 06/24/2016 06/17/2016  Does Patient Have a Medical Advance Directive? Yes Yes Yes No Yes No No  Type of Paramedic of [place];Living will - [place];Living will - Living will - -  Does patient want to make changes to medical advance directive? - - No - Patient declined - - - -  Copy of [place] in Chart? No - copy requested - No - copy requested - - - -    Current Medications (verified) Outpatient Encounter Medications as of 02/13/2020  Medication Sig  . bisacodyl (DULCOLAX) 5 MG EC tablet Take 5 mg by mouth once a week.   . Calcium 600-200 MG-UNIT tablet Take 1 tablet by mouth daily.  . calcium-vitamin D (OSCAL WITH D) 500-200 MG-UNIT TABS tablet Take by mouth.  . diclofenac Sodium (VOLTAREN) 1 % GEL APPLY 2 GRAMS TO AFFECTED AREA 4 TIMES A DAY  . docusate sodium (COLACE) 100 MG capsule Take 100-200  mg by mouth See admin instructions. Take 200 mg in the Morning and 100 mg in the eveing  . fluticasone (FLONASE) 50 MCG/ACT nasal spray Place 2 sprays into both nostrils daily.  . meloxicam (MOBIC) 15 MG tablet TAKE 1 TABLET BY MOUTH EVERY DAY (Patient taking differently: Take 15 mg by mouth as needed for pain. )  . nystatin (MYCOSTATIN/NYSTOP) powder Apply topically 2 (two) times daily.  Vladimir Faster Glycol-Propyl Glycol (SYSTANE) 0.4-0.3 % SOLN Apply 1 drop to eye once a week.  . vitamin B-12 (CYANOCOBALAMIN) 500 MCG tablet Take 500 mcg by mouth daily.  . Vitamin D, Cholecalciferol, 1000 units TABS Take 1,000 mcg by mouth daily.    No facility-administered encounter medications on file as of 02/13/2020.    Allergies (verified) Augmentin [amoxicillin-pot clavulanate] and Morphine and related   History: Past Medical History:  Diagnosis Date  . Allergy   . Anemia    IN THE PAST WITH PREGNANCY  . Arthritis    LEFT FOOT; OCCAS LOWER BACK PAIN - HX OF VERTEBRAL FRACTURES  . Blood transfusion without reported diagnosis    DURING PREGNANCY  . Cataract    removed both eyes   . Complication of anesthesia    a little sluggist when waking up-no intervention required  . Constipation    uses Colace 3 x a day and Dulcolax q week- with this has soft BM's- no pain   . Coronary artery spasm (HCC) 1990'S   AFTER OTC DIET PILLS -  PT EXPERIENCED CHEST PAIN- PT HAD CARDIAC CATH - TOLD NO CAD - THOUGHT TO HAVE HAS CORNARY ARTERY SPASM  . GERD (gastroesophageal reflux disease)    mild   . H/O seasonal allergies   . Headache(784.0)    OCCAS SINUS HEADACHES  . Hearing impaired    LEFT EAR DEAFNESS  . Hypertension    220/104 IN SPRING 2015 - SAW HER DOCTOR - BUT B/P NORMALIZED - PT DECIDED TO STOP CAFFEINE AND HAS NOT HAD ANY ELEVATED PRESSURES SINCE JUNE  . Osteopenia   . Strabismus    HAD STRABISMUS SURGERY 1956 - BUT STILL HAS STRABISMUS  . Vitamin D deficiency    Past Surgical History:   Procedure Laterality Date  . ABDOMINAL HYSTERECTOMY    . BONE ANCHORED HEARING AID IMPLANT Left 01/26/2019   Procedure: BONE ANCHORED HEARING AID (BAHA) IMPLANT;  Surgeon: [name], MD;  Location: [facility];  Service: ENT;  Laterality: Left;  . BREATH TEK H PYLORI N/A 10/08/2013   Procedure: BREATH TEK H PYLORI;  Surgeon: Gayland Curry, MD;  Location: [facility];  Service: General;  Laterality: N/A;  . BREATH TEK H PYLORI N/A 12/03/2013   Procedure: BREATH TEK Kandis Ban;  Surgeon: Gayland Curry, MD;  Location: [facility];  Service: General;  Laterality: N/A;  . CHOLECYSTECTOMY N/A 04/13/2016   Procedure: LAPAROSCOPIC CHOLECYSTECTOMY WITH ATTEMPTED INTRAOPERATIVE CHOLANGIOGRAM;  Surgeon: Greer Pickerel, MD;  Location: [facility];  Service: General;  Laterality: N/A;  . COLONOSCOPY    . EYE SURGERY Bilateral 2017   cataract surgery with lens implant  . INNER EAR SURGERY Left 1965   RADICAL MASTOIDECTOMY  . LAPAROSCOPIC GASTRIC SLEEVE RESECTION N/A 12/18/2013   Procedure: LAPAROSCOPIC GASTRIC SLEEVE RESECTION;  Surgeon: Greer Pickerel, MD;  Location: [facility];  Service: General;  Laterality: N/A;  . REPAIR TENDONS FOOT Right 1994   AND RECONSTRUCTIVE SURGERY FOR MULTIPLE FRACTURES OF THE RIGHT FOOT  . STOMACH SURGERY    . strabismus repair    . TONSILLECTOMY    . UPPER GASTROINTESTINAL ENDOSCOPY     Family History  Problem Relation Age of Onset  . Hypertension Mother   . Diabetes Mother   . Obesity Mother   . Heart disease Mother   . Alcohol abuse Father   . Colon cancer Brother 28  . COPD Maternal Grandmother   . Hyperlipidemia Maternal Grandmother   . COPD Paternal Grandfather   . Colon polyps Sister   . Stomach cancer Neg Hx   . Rectal cancer Neg Hx   . Esophageal cancer Neg Hx   . Pancreatic cancer Neg Hx    Social History   Socioeconomic History  . Marital status: Married    Spouse name: Marden Noble  . Number of children: 2  . Years of education: master's degree  . Highest education  level: Not on file  Occupational History  . Occupation: Programmer, multimedia:   Tobacco Use  . Smoking status: Never Smoker  . Smokeless tobacco: Never Used  Vaping Use  . Vaping Use: Never used  Substance and Sexual Activity  . Alcohol use: No    Alcohol/week: 0.0 standard drinks  . Drug use: No  . Sexual activity: Yes    Birth control/protection: Surgical  Other Topics Concern  . 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 Difficulty of Paying Living Expenses: Not hard at all  Food Insecurity: No Food Insecurity  . Worried About Charity fundraiser in the Last Year: Never true  . Ran Out of Food in the Last Year: Never true  Transportation Needs: No Transportation Needs  . Lack of Transportation (Medical): No  . Lack of Transportation (Non-Medical): No  Physical Activity: Sufficiently Active  . Days of Exercise per Week: 7 days  . Minutes of Exercise per Session: 30 min  Stress: No Stress Concern Present  . Feeling of Stress : Not at all  Social Connections:   . Frequency of Communication with Friends and Family: Not on file  . Frequency of Social Gatherings with Friends and Family: Not on file  . Attends Religious Services: Not on file  . Active Member of Clubs or  Organizations: Not on file  . Attends Archivist Meetings: Not on file  . Marital Status: Not on file    Tobacco Counseling Counseling given: Not Answered   Clinical Intake:  Pre-visit preparation completed: Yes  Pain : 0-10 Pain Score: 5  Pain Type: Acute pain Pain Location: Leg Pain Orientation: Right Pain Descriptors / Indicators: Aching Pain Onset: More than a month ago Pain Frequency: Intermittent     Nutritional Risks: None Diabetes: No  How often do you need to have someone help you when you read instructions, pamphlets, or other written materials from your doctor or pharmacy?: 1 - Never What is the last grade level you completed in school?: masters  Diabetic: No Nutrition Risk Assessment:  Has the patient had any N/V/D within the last 2 months?  Yes , diarrhea and vomiting but is better now  Does the patient have any non-healing wounds?  No  Has the patient had any unintentional weight loss or weight gain?  No   Diabetes:  Is the patient diabetic?  No  If diabetic, was a CBG obtained today?  N/A Did the patient bring in their glucometer from home?  N/A How often do you monitor your CBG's? N/A.   Financial Strains and Diabetes Management:  Are you having any financial strains with the device, your supplies or your medication? N/A.  Does the patient want to be seen by Chronic Care Management for management of their diabetes?  N/A Would the patient like to be referred to a Nutritionist or for Diabetic Management?  N/A   Interpreter Needed?: No  Information entered by :: CJohnson, LPN   Activities of Daily Living In your present state of health, do you have any difficulty performing the following activities: 02/13/2020  Hearing? Y  Comment wears hearing aids  Vision? N  Difficulty concentrating or making decisions? N  Walking or climbing stairs? N  Dressing  or bathing? N  Doing errands, shopping? N  Preparing Food and eating ? N  Using  the Toilet? N  In the past six months, have you accidently leaked urine? N  Do you have problems with loss of bowel control? N  Managing your Medications? N  Managing your Finances? N  Housekeeping or managing your Housekeeping? N  Some recent data might be hidden    Patient Care Team: Lesleigh Noe, MD as PCP - General (Family Medicine)  Indicate any recent Medical Services you may have received from other than [facility] providers in the past year (date may be approximate).     Assessment:   This is a routine wellness examination for [place].  Hearing/Vision screen  Hearing Screening   125Hz  250Hz  500Hz  1000Hz  2000Hz  3000Hz  4000Hz  6000Hz  8000Hz   Right ear:           Left ear:           Vision Screening Comments: Patient gets annual eye exams   Dietary issues and exercise activities discussed: Current Exercise Habits: Home exercise routine, Type of exercise: treadmill;walking, Time (Minutes): 30, Frequency (Times/Week): 7, Weekly Exercise (Minutes/Week): 210, Intensity: Moderate, Exercise limited by: None identified  Goals    . DIET - INCREASE WATER INTAKE    . Exercise 150 min/wk Moderate Activity    . Patient Stated     02/13/2020, I will continue to walk on my treadmill 3-4 times a week for 20 minutes. I walk 6,000 steps everyday also.       Depression Screen PHQ 2/9 Scores 02/13/2020 06/14/2018 08/16/2017 12/18/2014  PHQ - 2 Score 0 0 0 0  PHQ- 9 Score 0 - - -    Fall Risk Fall Risk  02/13/2020 06/14/2018 08/16/2017  Falls in the past year? 1 1 No  Comment has had several falls - -  Number falls in past yr: 1 1 -  Injury with Fall? 1 1 -  Comment cracked a rib, bruised leg - -  Risk for fall due to : Impaired balance/gait;History of fall(s) Impaired balance/gait -  Follow up Falls evaluation completed;Falls prevention discussed Education provided;Falls prevention discussed -    Any stairs in or around the home? Yes  If so, are there any without handrails? No  Home  free of loose throw rugs in walkways, pet beds, electrical cords, etc? Yes  Adequate lighting in your home to reduce risk of falls? Yes   ASSISTIVE DEVICES UTILIZED TO PREVENT FALLS:  Life alert? No  Use of a cane, walker or w/c? No  Grab bars in the bathroom? No  Shower chair or bench in shower? No  Elevated toilet seat or a handicapped toilet? No   TIMED UP AND GO:  Was the test performed? N/A, telephone visit.    Cognitive Function: MMSE - Mini Mental State Exam 02/13/2020 06/14/2018  Orientation to time 5 5  Orientation to Place 5 5  Registration 3 3  Attention/ Calculation 5 5  Recall 3 3  Language- name 2 objects - 2  Language- repeat 1 1  Language- follow 3 step command - 3  Language- read & follow direction - 1  Write a sentence - 1  Copy design - 1  Total score - 30  Mini Cog  Mini-Cog screen was completed. Maximum score is 22. A value of 0 denotes this part of the MMSE was not completed or the patient failed this part of the Mini-Cog screening.  Immunizations Immunization History  Administered Date(s) Administered  . Fluad Quad(high Dose 65+) 01/30/2020  . Influenza Split 02/03/2013  . Influenza,inj,Quad PF,6+ Mos 12/18/2014  . Influenza-Unspecified 12/05/2015, 12/21/2016, 01/26/2018  . PFIZER SARS-COV-2 Vaccination 06/01/2019, 06/26/2019  . Pneumococcal Conjugate-13 10/13/2017  . Td 10/23/2008    TDAP status: Due, Education has been provided regarding the importance of this vaccine. Advised may receive this vaccine at local pharmacy or Health Dept. Aware to provide a copy of the vaccination record if obtained from local pharmacy or Health Dept. Verbalized acceptance and understanding. Flu Vaccine status: Up to date Pneumococcal vaccine status: due, will get at upcoming office visit Covid-19 vaccine status: Completed vaccines  Qualifies for Shingles Vaccine? Yes   Zostavax completed No   Shingrix Completed?: No.    Education has been provided  regarding the importance of this vaccine. Patient has been advised to call insurance company to determine out of pocket expense if they have not yet received this vaccine. Advised may also receive vaccine at local pharmacy or Health Dept. Verbalized acceptance and understanding.  Screening Tests Health Maintenance  Topic Date Due  . PNA vac Low Risk Adult (2 of 2 - PPSV23) 10/14/2018  . MAMMOGRAM  09/27/2019  . TETANUS/TDAP  02/13/2024 (Originally 10/24/2018)  . COLONOSCOPY  08/13/2024  . INFLUENZA VACCINE  Completed  . DEXA SCAN  Completed  . COVID-19 Vaccine  Completed  . Hepatitis C Screening  Completed    Health Maintenance  Health Maintenance Due  Topic Date Due  . PNA vac Low Risk Adult (2 of 2 - PPSV23) 10/14/2018  . MAMMOGRAM  09/27/2019    Colorectal cancer screening: Completed 08/14/2019. Repeat every 5 years Mammogram status: due, will call and setup appointment Bone Density status:due, will call and setup appointment  Lung Cancer Screening: (Low Dose CT Chest recommended if Age 38-80 years, 30 pack-year currently smoking OR have quit w/in 15years.) does not qualify.  Additional Screening:  Hepatitis C Screening: does qualify; Completed 12/18/2014  Vision Screening: Recommended annual ophthalmology exams for early detection of glaucoma and other disorders of the eye. Is the patient up to date with their annual eye exam?  Yes  Who is the provider or what is the name of the office in which the patient attends annual eye exams? Dr. Gordan Payment If pt is not established with a provider, would they like to be referred to a provider to establish care? No .   Dental Screening: Recommended annual dental exams for proper oral hygiene  Community Resource Referral / Chronic Care Management: CRR required this visit?  No   CCM required this visit?  No      Plan:     I have personally reviewed and noted the following in the patient's chart:   . Medical and social  history . Use of alcohol, tobacco or illicit drugs  . Current medications and supplements . Functional ability and status . Nutritional status . Physical activity . Advanced directives . List of other physicians . Hospitalizations, surgeries, and ER visits in previous 12 months . Vitals . Screenings to include cognitive, depression, and falls . Referrals and appointments  In addition, I have reviewed and discussed with patient certain preventive protocols, quality metrics, and best practice recommendations. A written personalized care plan for preventive services as well as general preventive health recommendations were provided to patient.   Due to this being a telephonic visit, the after visit summary with patients personalized plan was offered to patient via  office or my-chart. Patient preferred to pick up at office at next visit or via mychart.   Andrez Grime, LPN   [id number]

## 2020-02-18 ENCOUNTER — Other Ambulatory Visit: Payer: Self-pay

## 2020-02-18 ENCOUNTER — Ambulatory Visit (INDEPENDENT_AMBULATORY_CARE_PROVIDER_SITE_OTHER): Payer: Medicare PPO | Admitting: Family Medicine

## 2020-02-18 VITALS — BP 122/80 | HR 84 | Temp 97.8°F | Ht <= 58 in | Wt 171.5 lb

## 2020-02-18 DIAGNOSIS — M810 Age-related osteoporosis without current pathological fracture: Secondary | ICD-10-CM

## 2020-02-18 DIAGNOSIS — E782 Mixed hyperlipidemia: Secondary | ICD-10-CM | POA: Diagnosis not present

## 2020-02-18 DIAGNOSIS — Z0000 Encounter for general adult medical examination without abnormal findings: Secondary | ICD-10-CM

## 2020-02-18 DIAGNOSIS — Z23 Encounter for immunization: Secondary | ICD-10-CM

## 2020-02-18 DIAGNOSIS — R2689 Other abnormalities of gait and mobility: Secondary | ICD-10-CM | POA: Diagnosis not present

## 2020-02-18 LAB — COMPREHENSIVE METABOLIC PANEL
ALT: 18 U/L (ref 0–35)
AST: 24 U/L (ref 0–37)
Albumin: 4.2 g/dL (ref 3.5–5.2)
Alkaline Phosphatase: 62 U/L (ref 39–117)
BUN: 18 mg/dL (ref 6–23)
CO2: 29 mEq/L (ref 19–32)
Calcium: 8.9 mg/dL (ref 8.4–10.5)
Chloride: 104 mEq/L (ref 96–112)
Creatinine, Ser: 0.68 mg/dL (ref 0.40–1.20)
GFR: 89.91 mL/min (ref >60.00)
Glucose, Bld: 85 mg/dL (ref 70–99)
Potassium: 4.1 mEq/L (ref 3.5–5.1)
Sodium: 141 mEq/L (ref 135–145)
Total Bilirubin: 0.3 mg/dL (ref 0.2–1.2)
Total Protein: 6.6 g/dL (ref 6.0–8.3)

## 2020-02-18 LAB — CBC
HCT: 39.5 % (ref 36.0–46.0)
Hemoglobin: 13.3 g/dL (ref 12.0–15.0)
MCHC: 33.7 g/dL (ref 30.0–36.0)
MCV: 95.4 fl (ref 78.0–100.0)
Platelets: 317 10*3/uL (ref 150.0–400.0)
RBC: 4.14 Mil/uL (ref 3.87–5.11)
RDW: 14.1 % (ref 11.5–15.5)
WBC: 9.3 10*3/uL (ref 4.0–10.5)

## 2020-02-18 LAB — LIPID PANEL
Cholesterol: 182 mg/dL (ref 0–200)
HDL: 65.4 mg/dL (ref >39.00)
LDL Cholesterol: 91 mg/dL (ref 0–99)
NonHDL: 116.5
Total CHOL/HDL Ratio: 3
Triglycerides: 129 mg/dL (ref 0.0–149.0)
VLDL: 25.8 mg/dL (ref 0.0–40.0)

## 2020-02-18 NOTE — Assessment & Plan Note (Signed)
Pt notes more frequent falls. Suspect weakness. PT referral for strengthening and training.

## 2020-02-18 NOTE — Progress Notes (Signed)
Annual Exam   Chief Complaint:  Chief Complaint  Patient presents with  . Medicare Wellness    will schedule mammogram   . balance issues    History of Present Illness:  Ms. [name] [name] is a 69 y.o. No obstetric history on file. who LMP was No LMP recorded. Patient has had a hysterectomy., presents today for her annual examination.    #Balance concerns - deafness and right foot injury - has had several falls - 30 years ago - injured her left foot - has deafness in the left ear which has contributed to her being clumsy for decades - now she is having trouble stopping herself from falling - also has right leg pain x 1 year - only at night  - will wake her up  - anywhere along the leg   Nutrition She does get adequate calcium and Vitamin D in her diet. Diet: turning better, working on weight loss Exercise: cycling  Safety The patient wears seatbelts: yes.     The patient feels safe at home and in their relationships: yes.   Menstrual:  Passed this  GYN She is sexually active  Cervical Cancer Screening (21-65):   Last Pap:   July 2019 Results were: no abnormalities /neg HPV DNA   Breast Cancer Screening (Age 69-74):  There is no FH of breast cancer. There is no FH of ovarian cancer. BRCA screening Not Indicated.  Last Mammogram: 09/2018 The patient does want a mammogram this year.    Colon Cancer Screening:  Age 59-75 yo - benefits outweigh the risk. Adults 5-85 yo who have never been screened benefit.  Benefits: 134000 people in 2016 will be diagnosed and 49,000 will die - early detection helps [place]: Complications 2/2 to colonoscopy High Risk (Colonoscopy): genetic disorder (Lynch syndrome or familial adenomatous polyposis), personal hx of IBD, previous adenomatous polyp, or previous colorectal cancer, FamHx start 10 years before the age at diagnosis, increased in males and black race  Options:  FIT - looks for hemoglobin (blood in the stool) - specific  and fairly sensitive - must be done annually Cologuard - looks for DNA and blood - more sensitive - therefore can have more false positives, every 3 years Colonoscopy - every 10 years if normal - sedation, bowl prep, must have someone drive you  Shared decision making and the patient had decided to do colonoscopy - due in 2026.   Social History   Tobacco Use  Smoking Status Never Smoker  Smokeless Tobacco Never Used    Lung Cancer Screening (Ages 22-97): not applicable   Weight Wt Readings from Last 3 Encounters:  02/18/20 171 lb 8 oz (77.8 kg)  08/14/19 164 lb (74.4 kg)  07/26/19 164 lb (74.4 kg)   Patient has high BMI  BMI Readings from Last 1 Encounters:  02/18/20 35.84 kg/m     Chronic disease screening Blood pressure monitoring:  BP Readings from Last 3 Encounters:  02/18/20 122/80  08/14/19 139/77  05/28/19 136/74    Lipid Monitoring: Indication for screening: age >47, obesity, diabetes, family hx, CV risk factors.  Lipid screening: Yes  Lab Results  Component Value Date   CHOL 182 02/18/2020   HDL 65.40 02/18/2020   LDLCALC 91 02/18/2020   LDLDIRECT 134.4 04/13/2011   TRIG 129.0 02/18/2020   CHOLHDL 3 02/18/2020     Diabetes Screening: age >47, overweight, family hx, PCOS, hx of gestational diabetes, at risk ethnicity Diabetes Screening screening: Yes  No results found  for: HGBA1C   Past Medical History:  Diagnosis Date  . Allergy   . Anemia    IN THE PAST WITH PREGNANCY  . Arthritis    LEFT FOOT; OCCAS LOWER BACK PAIN - HX OF VERTEBRAL FRACTURES  . Blood transfusion without reported diagnosis    DURING PREGNANCY  . Cataract    removed both eyes   . Complication of anesthesia    a little sluggist when waking up-no intervention required  . Constipation    uses Colace 3 x a day and Dulcolax q week- with this has soft BM's- no pain   . Coronary artery spasm (HCC) 1990'S   AFTER OTC DIET PILLS - PT EXPERIENCED CHEST PAIN- PT HAD CARDIAC  CATH - TOLD NO CAD - THOUGHT TO HAVE HAS CORNARY ARTERY SPASM  . GERD (gastroesophageal reflux disease)    mild   . H/O seasonal allergies   . Headache(784.0)    OCCAS SINUS HEADACHES  . Hearing impaired    LEFT EAR DEAFNESS  . Hypertension    220/104 IN SPRING 2015 - SAW HER DOCTOR - BUT B/P NORMALIZED - PT DECIDED TO STOP CAFFEINE AND HAS NOT HAD ANY ELEVATED PRESSURES SINCE JUNE  . Osteopenia   . Strabismus    HAD STRABISMUS SURGERY 1956 - BUT STILL HAS STRABISMUS  . Vitamin D deficiency     Past Surgical History:  Procedure Laterality Date  . ABDOMINAL HYSTERECTOMY    . BONE ANCHORED HEARING AID IMPLANT Left 01/26/2019   Procedure: BONE ANCHORED HEARING AID (BAHA) IMPLANT;  Surgeon: Serena Colonel, MD;  Location: [facility];  Service: ENT;  Laterality: Left;  . BREATH TEK H PYLORI N/A 10/08/2013   Procedure: BREATH TEK H PYLORI;  Surgeon: Atilano Ina, MD;  Location: [facility];  Service: General;  Laterality: N/A;  . BREATH TEK H PYLORI N/A 12/03/2013   Procedure: BREATH TEK Richardo Priest;  Surgeon: Atilano Ina, MD;  Location: [facility];  Service: General;  Laterality: N/A;  . CHOLECYSTECTOMY N/A 04/13/2016   Procedure: LAPAROSCOPIC CHOLECYSTECTOMY WITH ATTEMPTED INTRAOPERATIVE CHOLANGIOGRAM;  Surgeon: Gaynelle Adu, MD;  Location: [facility] OR;  Service: General;  Laterality: N/A;  . COLONOSCOPY    . EYE SURGERY Bilateral 2017   cataract surgery with lens implant  . INNER EAR SURGERY Left 1965   RADICAL MASTOIDECTOMY  . LAPAROSCOPIC GASTRIC SLEEVE RESECTION N/A 12/18/2013   Procedure: LAPAROSCOPIC GASTRIC SLEEVE RESECTION;  Surgeon: Gaynelle Adu, MD;  Location: [facility] ORS;  Service: General;  Laterality: N/A;  . REPAIR TENDONS FOOT Right 1994   AND RECONSTRUCTIVE SURGERY FOR MULTIPLE FRACTURES OF THE RIGHT FOOT  . STOMACH SURGERY    . strabismus repair    . TONSILLECTOMY    . UPPER GASTROINTESTINAL ENDOSCOPY      Prior to Admission medications   Medication Sig Start Date End Date Taking?  Authorizing Provider  bisacodyl (DULCOLAX) 5 MG EC tablet Take 5 mg by mouth once a week.    Yes [provider]  Calcium 600-200 MG-UNIT tablet Take 1 tablet by mouth daily.   Yes [provider]  calcium-vitamin D (OSCAL WITH D) 500-200 MG-UNIT TABS tablet Take by mouth.   Yes [provider]  diclofenac Sodium (VOLTAREN) 1 % GEL APPLY 2 GRAMS TO AFFECTED AREA 4 TIMES A DAY 02/20/19  Yes [provider]  docusate sodium (COLACE) 100 MG capsule Take 100-200 mg by mouth See admin instructions. Take 200 mg in the Morning and 100 mg in  the eveing   Yes [provider]  fluticasone (FLONASE) 50 MCG/ACT nasal spray Place 2 sprays into both nostrils daily.   Yes [provider]  meloxicam (MOBIC) 15 MG tablet TAKE 1 TABLET BY MOUTH EVERY DAY Patient taking differently: Take 15 mg by mouth as needed for pain.  11/14/18  Yes Lucille Passy, MD  nystatin (MYCOSTATIN/NYSTOP) powder Apply topically 2 (two) times daily. 12/13/18  Yes Lucille Passy, MD  Polyethyl Glycol-Propyl Glycol (SYSTANE) 0.4-0.3 % SOLN Apply 1 drop to eye once a week.   Yes [provider]  vitamin B-12 (CYANOCOBALAMIN) 500 MCG tablet Take 500 mcg by mouth daily.   Yes [provider]  Vitamin D, Cholecalciferol, 1000 units TABS Take 1,000 mcg by mouth daily.    Yes [provider]    Allergies  Allergen Reactions  . Augmentin [Amoxicillin-Pot Clavulanate] Diarrhea and Nausea Only  . Morphine And Related Other (See Comments)    DIZZINESS HYPOTENSION HYPOXIA    Gynecologic History: No LMP recorded. Patient has had a hysterectomy.  Obstetric History: No obstetric history on file.  Social History   Socioeconomic History  . Marital status: Married    Spouse name: Marden Noble  . Number of children: 2  . Years of education: master's degree  . Highest education level: Not on file  Occupational History  . Occupation: Programmer, multimedia: [employer]  Tobacco  Use  . Smoking status: Never Smoker  . Smokeless tobacco: Never Used  Vaping Use  . Vaping Use: Never used  Substance and Sexual Activity  . Alcohol use: No    Alcohol/week: 0.0 standard drinks  . Drug use: No  . Sexual activity: Yes    Birth control/protection: Surgical  Other Topics Concern  . 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 Difficulty of Paying Living Expenses: Not hard at all  Food Insecurity: No Food Insecurity  . Worried About Charity fundraiser in the Last Year: Never true  . Ran Out of Food in the Last Year: Never true  Transportation Needs: No Transportation Needs  . Lack of Transportation (Medical): No  . Lack of Transportation (Non-Medical): No  Physical Activity: Sufficiently Active  . Days of Exercise per Week: 7 days  . Minutes of Exercise per Session: 30 min  Stress: No Stress Concern Present  . Feeling of Stress : Not at all  Social Connections:   . Frequency of Communication with Friends and Family: Not on file  . Frequency of Social Gatherings with Friends and Family: Not on file  . Attends Religious Services: Not on  file  . Active Member of Clubs or Organizations: Not on file  . Attends Archivist Meetings: Not on file  . Marital  Status: Not on file  Intimate Partner Violence: Not At Risk  . Fear of Current or Ex-Partner: No  . Emotionally Abused: No  . Physically Abused: No  . Sexually Abused: No    Family History  Problem Relation Age of Onset  . Hypertension Mother   . Diabetes Mother   . Obesity Mother   . Heart disease Mother   . Alcohol abuse Father   . Colon cancer Brother 49  . COPD Maternal Grandmother   . Hyperlipidemia Maternal Grandmother   . COPD Paternal Grandfather   . Colon polyps Sister   . Stomach cancer Neg Hx   . Rectal cancer Neg Hx   . Esophageal cancer Neg Hx   . Pancreatic cancer Neg Hx     Review of Systems  Constitutional: Negative for chills and fever.  HENT: Negative for congestion and sore throat.   Eyes: Negative for blurred vision and double vision.  Respiratory: Negative for shortness of breath.   Cardiovascular: Negative for chest pain.  Gastrointestinal: Negative for heartburn, nausea and vomiting.  Genitourinary: Negative.   Musculoskeletal: Negative.  Negative for myalgias.       Balance issues  Skin: Negative for rash.  Neurological: Negative for dizziness and headaches.  Endo/Heme/Allergies: Does not bruise/bleed easily.  Psychiatric/Behavioral: Negative for depression. The patient is not nervous/anxious.      Physical Exam BP 122/80   Pulse 84   Temp 97.8 F (36.6 C) (Temporal)   Ht $R'4\' 10"'OL$  (1.473 m)   Wt 171 lb 8 oz (77.8 kg)   SpO2 98%   BMI 35.84 kg/m    BP Readings from Last 3 Encounters:  02/18/20 122/80  08/14/19 139/77  05/28/19 136/74      Physical Exam Constitutional:      General: She is not in acute distress.    Appearance: She is well-developed. She is not diaphoretic.  HENT:     Head: Normocephalic and atraumatic.     Right Ear: External ear normal.     Left Ear: External ear normal.     Nose: Nose normal.  Eyes:     General: No scleral icterus.    Conjunctiva/sclera: Conjunctivae normal.  Cardiovascular:     Rate and  Rhythm: Normal rate and regular rhythm.     Heart sounds: No murmur heard.   Pulmonary:     Effort: Pulmonary effort is normal. No respiratory distress.     Breath sounds: Normal breath sounds. No wheezing.  Abdominal:     General: Bowel sounds are normal. There is no distension.     Palpations: Abdomen is soft. There is no mass.     Tenderness: There is no abdominal tenderness. There is no guarding or rebound.  Musculoskeletal:        General: Normal range of motion.     Cervical back: Neck supple.  Lymphadenopathy:     Cervical: No cervical adenopathy.  Skin:    General: Skin is warm and dry.     Capillary Refill: Capillary refill takes less than 2 seconds.  Neurological:     General: No focal deficit present.     Mental Status: She is alert and oriented to person, place, and time.     Sensory: No sensory deficit.     Coordination: Coordination normal.     Gait: Gait normal.  Deep Tendon Reflexes: Reflexes normal.  Psychiatric:        Behavior: Behavior normal.     Results:  PHQ-9:    Clinical Support from 02/13/2020 in [place] at [facility] Total Score 0        Assessment: 69 y.o. No obstetric history on file. female here for routine annual physical examination.  Plan: Problem List Items Addressed This Visit      Musculoskeletal and Integument   Osteoporosis    Discussed due to osteonecrosis she is no longer a candidate for osteoporosis treatment. Cont Vit d/Calcium and weight bearing activity      Relevant Orders   Comprehensive metabolic panel (Completed)   CBC (Completed)     Other   Mixed hyperlipidemia   Relevant Orders   Lipid panel (Completed)   Balance problems    Pt notes more frequent falls. Suspect weakness. PT referral for strengthening and training.       Relevant Orders   Ambulatory referral to Physical Therapy    Other Visit Diagnoses    Annual physical exam    -  Primary   Relevant Orders   Pneumococcal  polysaccharide vaccine 23-valent greater than or equal to 2yo subcutaneous/IM (Completed)   Need for 23-polyvalent pneumococcal polysaccharide vaccine       Relevant Orders   Pneumococcal polysaccharide vaccine 23-valent greater than or equal to 2yo subcutaneous/IM (Completed)      Screening: -- Blood pressure screen normal -- cholesterol screening: will obtain -- Weight screening: overweight: continue to monitor -- Diabetes Screening: will obtain -- Nutrition: Encouraged healthy diet  The 10-year ASCVD risk score Mikey Bussing DC Jr., et al., 2013) is: 5.7%   Values used to calculate the score:     Age: 58 years     Sex: Female     Is Non-Hispanic African American: No     Diabetic: No     Tobacco smoker: No     Systolic Blood Pressure: 277 mmHg     Is BP treated: No     HDL Cholesterol: 65.4 mg/dL     Total Cholesterol: 182 mg/dL  -- Statin therapy for Age 72-75 with CVD risk >7.5%  Psych -- Depression screening (PHQ-9):    Clinical Support from 02/13/2020 in [place] at [facility]  PHQ-9 Total Score 0       Safety -- tobacco screening: not using -- alcohol screening:  low-risk usage. -- no evidence of domestic violence or intimate partner violence.   Cancer Screening -- family history of breast cancer screening: done. not at high risk. -- Mammogram - number given to schedule -- Colon cancer (age 58+)-- up to date  Immunizations Immunization History  Administered Date(s) Administered  . Fluad Quad(high Dose 65+) 01/30/2020  . Influenza Split 02/03/2013  . Influenza,inj,Quad PF,6+ Mos 12/18/2014  . Influenza-Unspecified 12/05/2015, 12/21/2016, 01/26/2018  . PFIZER SARS-COV-2 Vaccination 06/01/2019, 06/26/2019  . Pneumococcal Conjugate-13 10/13/2017  . Pneumococcal Polysaccharide-23 02/18/2020  . Td 10/23/2008    -- flu vaccine up to date -- TDAP q10 years advised getting -- Shingles (age >64) advised getting -- PPSV-23 (19-64 with chronic disease or  smoking) up to date -- PCV-13 (age >12) - one dose followed by PPSV-23 1 year later up to date -- Covid-19 Vaccine up to date   Encouraged healthy diet and exercise. Encouraged regular vision and dental care.    Lesleigh Noe, MD

## 2020-02-18 NOTE — Patient Instructions (Addendum)
Look into shingles shots - can get with the covid booster  Tdap is also due   Would recommend the following for bone health:   1) 800 units of Vitamin D daily 2) Get 1200 mg of elemental calcium --- this is best from your diet. Try to track how much calcium you get on a typical day. You could find ways to add more (dairy products, leafy greens). Take a supplement for whatever you don't typically get so you reach 1200 mg of calcium.  3) Physical activity (ideally weight bearing) - like walking briskly 30 minutes 5 days a week.    Please call the location of your choice from the menu below to schedule your Mammogram and/or Bone Density appointment.    [place]   [address] [facility]                      Phone:  [phone]                               [place], [place] [address]                                                             Services: Traditional and 3D Mammogram, Bone Density

## 2020-02-18 NOTE — Assessment & Plan Note (Signed)
Discussed due to osteonecrosis she is no longer a candidate for osteoporosis treatment. Cont Vit d/Calcium and weight bearing activity

## 2020-02-20 ENCOUNTER — Other Ambulatory Visit: Payer: Self-pay | Admitting: Family Medicine

## 2020-02-20 ENCOUNTER — Encounter: Payer: Self-pay | Admitting: Family Medicine

## 2020-02-20 DIAGNOSIS — Z1231 Encounter for screening mammogram for malignant neoplasm of breast: Secondary | ICD-10-CM

## 2020-04-02 ENCOUNTER — Ambulatory Visit: Payer: Medicare PPO | Attending: Family Medicine | Admitting: Physical Therapy

## 2020-04-02 ENCOUNTER — Other Ambulatory Visit: Payer: Self-pay

## 2020-04-02 VITALS — BP 148/70 | HR 88

## 2020-04-02 DIAGNOSIS — R2681 Unsteadiness on feet: Secondary | ICD-10-CM | POA: Insufficient documentation

## 2020-04-02 DIAGNOSIS — M6281 Muscle weakness (generalized): Secondary | ICD-10-CM | POA: Diagnosis not present

## 2020-04-02 DIAGNOSIS — R2689 Other abnormalities of gait and mobility: Secondary | ICD-10-CM | POA: Diagnosis not present

## 2020-04-02 DIAGNOSIS — R296 Repeated falls: Secondary | ICD-10-CM | POA: Insufficient documentation

## 2020-04-02 NOTE — Therapy (Signed)
[facility] MAIN [facility] SERVICES [address] [place], [place], [address] Phone: [phone]   Fax:  [phone]  Physical Therapy Evaluation  Patient Details  Name: Shantasia Hunnell MRN: [id number] Date of Birth: 19-Nov-1950 Referring Provider (PT): Dr. Selena Batten   Encounter Date: 04/02/2020   PT End of Session - 04/02/20 0943    Visit Number 1    Number of Visits 17    Date for PT Re-Evaluation 05/28/20    Authorization Type eval: 04/02/20    PT Start Time 0900    PT Stop Time 1000    PT Time Calculation (min) 60 min    Activity Tolerance Patient tolerated treatment well    Behavior During Therapy WFL for tasks assessed/performed           Past Medical History:  Diagnosis Date   Allergy    Anemia    IN THE PAST WITH PREGNANCY   Arthritis    LEFT FOOT; OCCAS LOWER BACK PAIN - HX OF VERTEBRAL FRACTURES   Blood transfusion without reported diagnosis    DURING PREGNANCY   Cataract    removed both eyes    Complication of anesthesia    a little sluggist when waking up-no intervention required   Constipation    uses Colace 3 x a day and Dulcolax q week- with this has soft BM's- no pain    Coronary artery spasm (HCC) 1990'S   AFTER OTC DIET PILLS - PT EXPERIENCED CHEST PAIN- PT HAD CARDIAC CATH - TOLD NO CAD - THOUGHT TO HAVE HAS CORNARY ARTERY SPASM   GERD (gastroesophageal reflux disease)    mild    H/O seasonal allergies    Headache(784.0)    OCCAS SINUS HEADACHES   Hearing impaired    LEFT EAR DEAFNESS   Hypertension    220/104 IN SPRING 2015 - SAW HER DOCTOR - BUT B/P NORMALIZED - PT DECIDED TO STOP CAFFEINE AND HAS NOT HAD ANY ELEVATED PRESSURES SINCE JUNE   Osteopenia    Strabismus    HAD STRABISMUS SURGERY 1956 - BUT STILL HAS STRABISMUS   Vitamin D deficiency     Past Surgical History:  Procedure Laterality Date   ABDOMINAL HYSTERECTOMY     BONE ANCHORED HEARING AID IMPLANT Left 01/26/2019    Procedure: BONE ANCHORED HEARING AID (BAHA) IMPLANT;  Surgeon: Serena Colonel, MD;  [facility] OR;  Service: ENT;  Laterality: Left;   BREATH TEK H PYLORI N/A 10/08/2013   Procedure: BREATH TEK H PYLORI;  Surgeon: Atilano Ina, MD;  Location: [facility];  Service: General;  Laterality: N/A;   BREATH TEK H PYLORI N/A 12/03/2013   Procedure: [place];  Surgeon: Atilano Ina, MD;  Location: [facility];  Service: General;  Laterality: N/A;   CHOLECYSTECTOMY N/A 04/13/2016   Procedure: LAPAROSCOPIC CHOLECYSTECTOMY WITH ATTEMPTED INTRAOPERATIVE CHOLANGIOGRAM;  Surgeon: Gaynelle Adu, MD;  Location: [facility] OR;  Service: General;  Laterality: N/A;   COLONOSCOPY     EYE SURGERY Bilateral 2017   cataract surgery with lens implant   INNER EAR SURGERY Left 1965   RADICAL MASTOIDECTOMY   LAPAROSCOPIC GASTRIC SLEEVE RESECTION N/A 12/18/2013   Procedure: LAPAROSCOPIC GASTRIC SLEEVE RESECTION;  Surgeon: Gaynelle Adu, MD;  Location: [facility]S;  Service: General;  Laterality: N/A;   REPAIR TENDONS FOOT Right 1994   AND RECONSTRUCTIVE SURGERY FOR MULTIPLE FRACTURES OF THE RIGHT FOOT   STOMACH SURGERY     strabismus repair  TONSILLECTOMY     UPPER GASTROINTESTINAL ENDOSCOPY      Vitals:   04/02/20 0908  BP: (!) 148/70  Pulse: 88  SpO2: 97%      Subjective Assessment - 04/02/20 0901    Subjective Patient states she is at a very high fall risk and is fearful of falling when she leaves the house.    Pertinent History 69 y.o. female presents to skilled physical therapy with a referral for balance deficits. She has a past history of an hysterectomy. She has deafness in her left ear which has contributed to her being clumsy for decades. She has had several falls in the past year and reports of a right foot injury. She is having trouble stopping herself from falling. She also had right leg pain for the past year that is only at night and will wake her up.    Limitations  Walking;Lifting;Standing;House hold activities    How long can you sit comfortably? 2-3 hours    How long can you stand comfortably? 15-20 minutes    How long can you walk comfortably? 30 minutes    Patient Stated Goals "I want to get better in my balance skills and get stronger."    Currently in Pain? No/denies              [facility] PT Assessment - 04/02/20 0001      Assessment   Medical Diagnosis Imbalance    Referring Provider (PT) Dr. Selena Batten    Hand Dominance Right      Precautions   Precautions Fall      Restrictions   Weight Bearing Restrictions No      Balance Screen   Has the patient fallen in the past 6 months Yes    How many times? 3    Has the patient had a decrease in activity level because of a fear of falling?  Yes    Is the patient reluctant to leave their home because of a fear of falling?  No      Home Nurse, mental health Private residence    Living Arrangements Spouse/significant other    Available Help at Discharge Family    Type of Home House    Home Access Stairs to enter    Entrance Stairs-Number of Steps 2    Entrance Stairs-Rails None    Home Layout One level;Able to live on main level with bedroom/bathroom    Home Equipment Grab bars - tub/shower      Prior Function   Level of Independence Independent      6 Minute Walk- Baseline   6 Minute Walk- Baseline yes    BP (mmHg) 148/70    HR (bpm) 88    02 Sat (%RA) 97 %      6 Minute walk- Post Test   6 Minute Walk Post Test yes    BP (mmHg) 153/68    HR (bpm) 101    02 Sat (%RA) 99 %    Perceived Rate of Exertion (Borg) 11- Fairly light      6 minute walk test results    Aerobic Endurance Distance Walked 1415      Standardized Balance Assessment   Standardized Balance Assessment Berg Balance Test;Timed Up and Go Test;Five Times Sit to Stand    Five times sit to stand comments  14      Berg Balance Test   Sit to Stand Able to stand without using hands and stabilize  independently  Standing Unsupported Able to stand safely 2 minutes    Sitting with Back Unsupported but Feet Supported on Floor or Stool Able to sit safely and securely 2 minutes    Stand to Sit Sits safely with minimal use of hands    Transfers Able to transfer safely, minor use of hands    Standing Unsupported with Eyes Closed Able to stand 10 seconds with supervision    Standing Unsupported with Feet Together Able to place feet together independently and stand for 1 minute with supervision    From Standing, Reach Forward with Outstretched Arm Can reach forward >5 cm safely (2")    From Standing Position, Pick up Object from [place] to pick up shoe, needs supervision    From Standing Position, Turn to Look Behind Over each Shoulder Looks behind from both sides and weight shifts well    Turn 360 Degrees Able to turn 360 degrees safely but slowly    Standing Unsupported, Alternately Place Feet on Step/Stool Able to complete 4 steps without aid or supervision    Standing Unsupported, One Foot in [employer] balance while stepping or standing    Standing on One Leg Tries to lift leg/unable to hold 3 seconds but remains standing independently    Total Score 40      Timed Up and Go Test   TUG Normal TUG    Normal TUG (seconds) 11            SUBJECTIVE Chief complaint: decreased balance Onset: 2019 Recent changes in overall health/medication: Yes Directional pattern for falls: Sideways, forward Prior history of physical therapy for balance: None Follow-up appointment with MD: None scheduled Red flags (bowel/bladder changes, saddle paresthesia, personal history of cancer, chills/fever, night sweats, unrelenting pain) Negative   OBJECTIVE  MUSCULOSKELETAL: Tremor: Absent Bulk: Normal Tone: Normal, no clonus   Strength R/L 3+/3+ Hip flexion 3+/3+ Hip extension  3+/4- Hip abduction 4-/4- Hip adduction 4+/4+ Knee extension 4-/4- Knee flexion 4/4 Ankle Plantarflexion 4/4-  Ankle Dorsiflexion   NEUROLOGICAL:  Mental Status Patient is oriented to person, place and time.  Recent memory is intact.  Remote memory is intact.  Attention span and concentration are intact.  Expressive speech is intact.  Patient's fund of knowledge is within normal limits for educational level.   Sensation Grossly intact to light touch bilateral UEs/LEs as determined by testing dermatomes C2-T2/L2-S2 respectively Proprioception and hot/cold testing deferred on this date   Coordination/Cerebellar Finger to Nose: WNL Heel to Shin: WNL Rapid alternating movements: WNL Finger Opposition: WNL Pronator Drift: Negative   FUNCTIONAL OUTCOME MEASURES FOTO: 54 Berg Balance: 40/56 TUG: 12.28 seconds ABC: 73.125% Single leg stance time: < 3 seconds   ASSESSMENT Clinical Impression:  69 y.o. pleasant female referred to physical therapy for difficulty with balance. PT examination reveals significant deficits in BLE strength with MMT score of 3+/5 on RLE and 4/5 on LLE. Patient has a decreased score on the [employer] of 40/56 causing patient to be at a high risk for falls. Patient demonstrated decreased single leg stance time of less than three seconds with difficulty. Patient presents with deficits in strength, gait and balance. Patient will benefit from skilled PT services to address deficits in balance and decrease risk for future falls.    PLAN Next Visit: initiate strengthening and balance exercises HEP: initiate HEP      PT Short Term Goals - 04/02/20 1000      PT SHORT TERM GOAL #1   Title  Patient will be independent in home exercise program to improve strength/mobility for better functional independence with ADLs.    Time 4    Period Weeks    Status New    Target Date 04/30/20      PT SHORT TERM GOAL #2   Title Patient will deny any falls over past 4 weeks to demonstrate improved safety awareness at home and work.    Time 4    Period Weeks    Status New     Target Date 04/30/20             PT Long Term Goals - 04/02/20 1000      PT LONG TERM GOAL #1   Title Patient will increase FOTO score to equal to or greater than 64 to demonstrate statistically significant improvement in mobility and quality of life.    Baseline 12/29: 54    Time 8    Period Weeks    Status New    Target Date 05/28/20      PT LONG TERM GOAL #2   Title Patient will increase Berg Balance score by > 6 points to demonstrate decreased fall risk during functional activities.    Baseline 12/29: 40/56    Time 8    Period Weeks    Status New    Target Date 05/28/20      PT LONG TERM GOAL #3   Title Patient will reduce timed up and go to <11 seconds to reduce fall risk and demonstrate improved transfer/gait ability.    Baseline 12/29: 12.28s    Time 8    Period Weeks    Status New    Target Date 05/28/20      PT LONG TERM GOAL #4   Title Patient will increase ABC scale score >80% to demonstrate better functional mobility and better confidence with ADLs    Baseline 12/29: 73.125%    Time 8    Period Weeks    Status New    Target Date 05/28/20      PT LONG TERM GOAL #5   Title Patient will tolerate 5 seconds of single leg stance without loss of balance to improve ability to get in and out of shower safely.    Baseline 12/29: < 3 seconds of SLS    Time 8    Period Weeks    Status New    Target Date 05/28/20                  Plan - 04/02/20 0956    Clinical Impression Statement 69 y.o. pleasant female referred to physical therapy for difficulty with balance. PT examination reveals significant deficits in BLE strength with MMT score of 3+/5 on RLE and 4/5 on LLE. Patient has a decreased score on the [employer] of 40/56 causing patient to be at a high risk for falls. Patient demonstrated decreased single leg stance time of less than three seconds with difficulty. Patient presents with deficits in strength, gait and balance. Patient will benefit from  skilled PT services to address deficits in balance and decrease risk for future falls.    Personal Factors and Comorbidities Comorbidity 3+    Comorbidities fall risk, osteoporosis, mixed hyperlipidemia, arthralgia, myaglia    Examination-Activity Limitations Reach Overhead;Stairs;Stand;Bend;Lift    Examination-Participation Restrictions Volunteer    Stability/Clinical Decision Making Evolving/Moderate complexity    Clinical Decision Making Moderate    Rehab Potential Good    PT Frequency 2x / week  PT Duration 8 weeks    PT Treatment/Interventions Gait training;Therapeutic activities;Therapeutic exercise;Balance training;Neuromuscular re-education;Patient/family education;Energy conservation;Aquatic Therapy;ADLs/Self Care Home Management;Electrical Stimulation;Moist Heat;Functional mobility training;Stair training    PT Next Visit Plan Initiate strengthening and balance exercises    PT Home Exercise Plan Initiate HEP    Consulted and Agree with Plan of Care Patient           Patient will benefit from skilled therapeutic intervention in order to improve the following deficits and impairments:  Abnormal gait,Decreased balance,Decreased endurance,Decreased mobility,Difficulty walking,Improper body mechanics,Decreased activity tolerance,Decreased safety awareness,Decreased strength  Visit Diagnosis: Muscle weakness (generalized)  Unsteadiness on feet  Repeated falls  Other abnormalities of gait and mobility     Problem List Patient Active Problem List   Diagnosis Date Noted   Balance problems 02/18/2020   Mixed hyperlipidemia 05/28/2019   Arthralgia 08/30/2018   Myalgia 08/29/2018   Vitamin D deficiency 08/29/2018   Osteoporosis 06/14/2018   Closed fracture of one rib of left side 04/03/2018   Atypical lymphocytes present on peripheral blood smear 06/02/2016   Family history of colon cancer-brother at age 64 05/27/2016   S/P laparoscopic sleeve gastrectomy  12/18/2013   Persistent disorder of initiating or maintaining sleep 11/05/2013   Disorder of bone and cartilage 10/17/2009   Jillyn Hidden PT, DPT Amelia Jo 04/02/2020, 1:13 PM  [facility] MAIN [facility] SERVICES [address] [place], [place], [address] Phone: [phone]   Fax:  [phone]  Name: Dustine Bertini MRN: [id number] Date of Birth: [birth year]

## 2020-04-07 ENCOUNTER — Ambulatory Visit: Payer: Medicare PPO | Attending: Family Medicine | Admitting: Physical Therapy

## 2020-04-07 ENCOUNTER — Other Ambulatory Visit: Payer: Self-pay

## 2020-04-07 DIAGNOSIS — R296 Repeated falls: Secondary | ICD-10-CM

## 2020-04-07 DIAGNOSIS — R2689 Other abnormalities of gait and mobility: Secondary | ICD-10-CM | POA: Diagnosis not present

## 2020-04-07 DIAGNOSIS — R2681 Unsteadiness on feet: Secondary | ICD-10-CM | POA: Diagnosis not present

## 2020-04-07 DIAGNOSIS — M6281 Muscle weakness (generalized): Secondary | ICD-10-CM

## 2020-04-07 NOTE — Therapy (Signed)
[facility] MAIN [facility] SERVICES [address] [place], [place], [address] Phone: [phone]   Fax:  [phone]  Physical Therapy Treatment  Patient Details  Name: [name] [name] MRN: [id number] Date of Birth: [birth year] Referring Provider (PT): Dr. Selena Batten   Encounter Date: 04/07/2020   PT End of Session - 04/07/20 1645    Visit Number 2    Number of Visits 17    Date for PT Re-Evaluation 05/28/20    Authorization Type eval: 04/02/20    PT Start Time 1640    PT Stop Time 1725    PT Time Calculation (min) 45 min    Activity Tolerance Patient tolerated treatment well    Behavior During Therapy [facility] for tasks assessed/performed           Past Medical History:  Diagnosis Date  . Allergy   . Anemia    IN THE PAST WITH PREGNANCY  . Arthritis    LEFT FOOT; OCCAS LOWER BACK PAIN - HX OF VERTEBRAL FRACTURES  . Blood transfusion without reported diagnosis    DURING PREGNANCY  . Cataract    removed both eyes   . Complication of anesthesia    a little sluggist when waking up-no intervention required  . Constipation    uses Colace 3 x a day and Dulcolax q week- with this has soft BM's- no pain   . Coronary artery spasm (HCC) 1990'S   AFTER OTC DIET PILLS - PT EXPERIENCED CHEST PAIN- PT HAD CARDIAC CATH - TOLD NO CAD - THOUGHT TO HAVE HAS CORNARY ARTERY SPASM  . GERD (gastroesophageal reflux disease)    mild   . H/O seasonal allergies   . Headache(784.0)    OCCAS SINUS HEADACHES  . Hearing impaired    LEFT EAR DEAFNESS  . Hypertension    220/104 IN SPRING 2015 - SAW HER DOCTOR - BUT B/P NORMALIZED - PT DECIDED TO STOP CAFFEINE AND HAS NOT HAD ANY ELEVATED PRESSURES SINCE JUNE  . Osteopenia   . Strabismus    HAD STRABISMUS SURGERY 1956 - BUT STILL HAS STRABISMUS  . Vitamin D deficiency     Past Surgical History:  Procedure Laterality Date  . ABDOMINAL HYSTERECTOMY    . BONE ANCHORED HEARING AID IMPLANT Left 01/26/2019    Procedure: BONE ANCHORED HEARING AID (BAHA) IMPLANT;  Surgeon: Serena Colonel, MD;  Location: [facility] OR;  Service: ENT;  Laterality: Left;  . BREATH TEK H PYLORI N/A 10/08/2013   Procedure: BREATH TEK H PYLORI;  Surgeon: Atilano Ina, MD;  Location: [facility];  Service: General;  Laterality: N/A;  . BREATH TEK H PYLORI N/A 12/03/2013   Procedure: BREATH TEK Richardo Priest;  Surgeon: Atilano Ina, MD;  Location: [facility];  Service: General;  Laterality: N/A;  . CHOLECYSTECTOMY N/A 04/13/2016   Procedure: LAPAROSCOPIC CHOLECYSTECTOMY WITH ATTEMPTED INTRAOPERATIVE CHOLANGIOGRAM;  Surgeon: Gaynelle Adu, MD;  Location: [facility] OR;  Service: General;  Laterality: N/A;  . COLONOSCOPY    . EYE SURGERY Bilateral 2017   cataract surgery with lens implant  . INNER EAR SURGERY Left 1965   RADICAL MASTOIDECTOMY  . LAPAROSCOPIC GASTRIC SLEEVE RESECTION N/A 12/18/2013   Procedure: LAPAROSCOPIC GASTRIC SLEEVE RESECTION;  Surgeon: Gaynelle Adu, MD;  Location: [facility]S;  Service: General;  Laterality: N/A;  . REPAIR TENDONS FOOT Right 1994   AND RECONSTRUCTIVE SURGERY FOR MULTIPLE FRACTURES OF THE RIGHT FOOT  . STOMACH SURGERY    . strabismus repair    .  TONSILLECTOMY    . UPPER GASTROINTESTINAL ENDOSCOPY      There were no vitals filed for this visit.   Subjective Assessment - 04/07/20 1644    Subjective Patient denies of any new symptoms or pain since last therapy session.    Pertinent History 70 y.o. female presents to skilled physical therapy with a referral for balance deficits. She has a past history of an hysterectomy. She has deafness in her left ear which has contributed to her being clumsy for decades. She has had several falls in the past year and reports of a right foot injury. She is having trouble stopping herself from falling. She also had right leg pain for the past year that is only at night and will wake her up.    Limitations Walking;Lifting;Standing;House hold activities    How long can you sit  comfortably? 2-3 hours    How long can you stand comfortably? 15-20 minutes    How long can you walk comfortably? 30 minutes    Patient Stated Goals "I want to get better in my balance skills and get stronger."    Currently in Pain? No/denies            NuStep x L1 x 5 minutes  All exercises were completed with 2# ankle weights:  Seated LAQ x 20 BLE; Seated clams with green theraband x 20 BLE; Seated marches x 30 BLE;   Standing hip strengthening with 2# ankle weights: Hip flexion marches x 20 BLE; HS curls x 20 BLE; Hip abduction x 20 BLE; Hip extension x 20 BLE;   Standing heel raises with BUE support x 20 BLE;  Sit to stand from regular height chair without UE support 2 x 10;  Airex EO/EC x 3 30s each Airex EO/EC x WBOS x 3 30s each Airex EO/EC x NBOS x 3 30s each Airex beam x tandem walking x 3 laps without BUE support Airex beam x side stepping x 3 laps without BUE support      PT Short Term Goals - 04/02/20 1000      PT SHORT TERM GOAL #1   Title Patient will be independent in home exercise program to improve strength/mobility for better functional independence with ADLs.    Time 4    Period Weeks    Status New    Target Date 04/30/20      PT SHORT TERM GOAL #2   Title Patient will deny any falls over past 4 weeks to demonstrate improved safety awareness at home and work.    Time 4    Period Weeks    Status New    Target Date 04/30/20             PT Long Term Goals - 04/02/20 1000      PT LONG TERM GOAL #1   Title Patient will increase FOTO score to equal to or greater than 64 to demonstrate statistically significant improvement in mobility and quality of life.    Baseline 12/29: 54    Time 8    Period Weeks    Status New    Target Date 05/28/20      PT LONG TERM GOAL #2   Title Patient will increase Berg Balance score by > 6 points to demonstrate decreased fall risk during functional activities.    Baseline 12/29: 40/56    Time 8    Period  Weeks    Status New    Target Date 05/28/20  PT LONG TERM GOAL #3   Title Patient will reduce timed up and go to <11 seconds to reduce fall risk and demonstrate improved transfer/gait ability.    Baseline 12/29: 12.28s    Time 8    Period Weeks    Status New    Target Date 05/28/20      PT LONG TERM GOAL #4   Title Patient will increase ABC scale score >80% to demonstrate better functional mobility and better confidence with ADLs    Baseline 12/29: 73.125%    Time 8    Period Weeks    Status New    Target Date 05/28/20      PT LONG TERM GOAL #5   Title Patient will tolerate 5 seconds of single leg stance without loss of balance to improve ability to get in and out of shower safely.    Baseline 12/29: < 3 seconds of SLS    Time 8    Period Weeks    Status New    Target Date 05/28/20                 Plan - 04/07/20 1730    Clinical Impression Statement Patient tolerated strengthening exercises with increased muscle fatigue due to decreased functional capacity to activity. Patient reports of difficulty with LLE strengthening exercises due to weakness. She demonstrates increased postural swaying with balance exercises due to decreased proprioception cues. Patient would benefit from continued PT services to increase strength, mobility, and balance skills to improve patient's quality of life.    Personal Factors and Comorbidities Comorbidity 3+    Comorbidities fall risk, osteoporosis, mixed hyperlipidemia, arthralgia, myaglia    Examination-Activity Limitations Reach Overhead;Stairs;Stand;Bend;Lift    Examination-Participation Restrictions Volunteer    Stability/Clinical Decision Making Evolving/Moderate complexity    Rehab Potential Good    PT Frequency 2x / week    PT Duration 8 weeks    PT Treatment/Interventions Gait training;Therapeutic activities;Therapeutic exercise;Balance training;Neuromuscular re-education;Patient/family education;Energy conservation;Aquatic  Therapy;ADLs/Self Care Home Management;Electrical Stimulation;Moist Heat;Functional mobility training;Stair training    PT Next Visit Plan Progress balance/strength exercises    PT Home Exercise Plan HEP: SLR, mini squats, heel raises    Consulted and Agree with Plan of Care Patient           Patient will benefit from skilled therapeutic intervention in order to improve the following deficits and impairments:  Abnormal gait,Decreased balance,Decreased endurance,Decreased mobility,Difficulty walking,Improper body mechanics,Decreased activity tolerance,Decreased safety awareness,Decreased strength  Visit Diagnosis: Muscle weakness (generalized)  Unsteadiness on feet  Repeated falls  Other abnormalities of gait and mobility     Problem List Patient Active Problem List   Diagnosis Date Noted  . Balance problems 02/18/2020  . Mixed hyperlipidemia 05/28/2019  . Arthralgia 08/30/2018  . Myalgia 08/29/2018  . Vitamin D deficiency 08/29/2018  . Osteoporosis 06/14/2018  . Closed fracture of one rib of left side 04/03/2018  . Atypical lymphocytes present on peripheral blood smear 06/02/2016  . Family history of colon cancer-brother at age 74 05/27/2016  . S/P laparoscopic sleeve gastrectomy 12/18/2013  . Persistent disorder of initiating or maintaining sleep 11/05/2013  . Disorder of bone and cartilage 10/17/2009   Karl Luke PT, DPT Netta Corrigan 04/07/2020, 5:46 PM  [place] MAIN [facility] SERVICES [address] [place], [place], [address] Phone: [phone]   Fax:  [phone]  Name: [name] [name] MRN: [id number] Date of Birth: 01-Jan-1951

## 2020-04-08 ENCOUNTER — Ambulatory Visit: Payer: Medicare PPO

## 2020-04-08 ENCOUNTER — Ambulatory Visit
Admission: RE | Admit: 2020-04-08 | Discharge: 2020-04-08 | Disposition: A | Discharge status: Home / Self Care | Payer: Medicare PPO | Source: Ambulatory Visit | Attending: Family Medicine | Admitting: Family Medicine

## 2020-04-08 DIAGNOSIS — Z1231 Encounter for screening mammogram for malignant neoplasm of breast: Secondary | ICD-10-CM | POA: Diagnosis not present

## 2020-04-09 ENCOUNTER — Ambulatory Visit: Payer: Medicare PPO | Admitting: Physical Therapy

## 2020-04-09 ENCOUNTER — Other Ambulatory Visit: Payer: Self-pay

## 2020-04-09 DIAGNOSIS — R2689 Other abnormalities of gait and mobility: Secondary | ICD-10-CM | POA: Diagnosis not present

## 2020-04-09 DIAGNOSIS — M6281 Muscle weakness (generalized): Secondary | ICD-10-CM | POA: Diagnosis not present

## 2020-04-09 DIAGNOSIS — R296 Repeated falls: Secondary | ICD-10-CM | POA: Diagnosis not present

## 2020-04-09 DIAGNOSIS — R2681 Unsteadiness on feet: Secondary | ICD-10-CM | POA: Diagnosis not present

## 2020-04-09 NOTE — Therapy (Signed)
[place] [facility] MAIN [facility] SERVICES [address] [place], [place], [address] Phone: [phone]   Fax:  [phone]  Physical Therapy Treatment  Patient Details  Name: [name] [name] MRN: [id number] Date of Birth: [birth year] Referring Provider (PT): Dr. Selena Batten   Encounter Date: 04/09/2020   PT End of Session - 04/09/20 1815    Visit Number 3    Number of Visits 17    Date for PT Re-Evaluation 05/28/20    Authorization Type eval: 04/02/20    PT Start Time 1645    PT Stop Time 1730    PT Time Calculation (min) 45 min    Activity Tolerance Patient tolerated treatment well    Behavior During Therapy [facility] for tasks assessed/performed           Past Medical History:  Diagnosis Date  . Allergy   . Anemia    IN THE PAST WITH PREGNANCY  . Arthritis    LEFT FOOT; OCCAS LOWER BACK PAIN - HX OF VERTEBRAL FRACTURES  . Blood transfusion without reported diagnosis    DURING PREGNANCY  . Cataract    removed both eyes   . Complication of anesthesia    a little sluggist when waking up-no intervention required  . Constipation    uses Colace 3 x a day and Dulcolax q week- with this has soft BM's- no pain   . Coronary artery spasm (HCC) 1990'S   AFTER OTC DIET PILLS - PT EXPERIENCED CHEST PAIN- PT HAD CARDIAC CATH - TOLD NO CAD - THOUGHT TO HAVE HAS CORNARY ARTERY SPASM  . GERD (gastroesophageal reflux disease)    mild   . H/O seasonal allergies   . Headache(784.0)    OCCAS SINUS HEADACHES  . Hearing impaired    LEFT EAR DEAFNESS  . Hypertension    220/104 IN SPRING 2015 - SAW HER DOCTOR - BUT B/P NORMALIZED - PT DECIDED TO STOP CAFFEINE AND HAS NOT HAD ANY ELEVATED PRESSURES SINCE JUNE  . Osteopenia   . Strabismus    HAD STRABISMUS SURGERY 1956 - BUT STILL HAS STRABISMUS  . Vitamin D deficiency     Past Surgical History:  Procedure Laterality Date  . ABDOMINAL HYSTERECTOMY    . BONE ANCHORED HEARING AID IMPLANT Left 01/26/2019    Procedure: BONE ANCHORED HEARING AID (BAHA) IMPLANT;  Surgeon: Serena Colonel, MD;  Location: [facility] OR;  Service: ENT;  Laterality: Left;  . BREATH TEK H PYLORI N/A 10/08/2013   Procedure: BREATH TEK H PYLORI;  Surgeon: Atilano Ina, MD;  Location: [facility];  Service: General;  Laterality: N/A;  . BREATH TEK H PYLORI N/A 12/03/2013   Procedure: BREATH TEK Richardo Priest;  Surgeon: Atilano Ina, MD;  Location: [facility];  Service: General;  Laterality: N/A;  . CHOLECYSTECTOMY N/A 04/13/2016   Procedure: LAPAROSCOPIC CHOLECYSTECTOMY WITH ATTEMPTED INTRAOPERATIVE CHOLANGIOGRAM;  Surgeon: Gaynelle Adu, MD;  Location: [facility] OR;  Service: General;  Laterality: N/A;  . COLONOSCOPY    . EYE SURGERY Bilateral 2017   cataract surgery with lens implant  . INNER EAR SURGERY Left 1965   RADICAL MASTOIDECTOMY  . LAPAROSCOPIC GASTRIC SLEEVE RESECTION N/A 12/18/2013   Procedure: LAPAROSCOPIC GASTRIC SLEEVE RESECTION;  Surgeon: Gaynelle Adu, MD;  Location: [facility];  Service: General;  Laterality: N/A;  . REPAIR TENDONS FOOT Right 1994   AND RECONSTRUCTIVE SURGERY FOR MULTIPLE FRACTURES OF THE RIGHT FOOT  . STOMACH SURGERY    . strabismus repair    .  TONSILLECTOMY    . UPPER GASTROINTESTINAL ENDOSCOPY      There were no vitals filed for this visit.   Subjective Assessment - 04/09/20 1814    Subjective Patient reports she feels more energetic today. She was sitting all day long and is ready to move around. She denies of any falls or injuries since last therapy session.    Pertinent History 70 y.o. female presents to skilled physical therapy with a referral for balance deficits. She has a past history of an hysterectomy. She has deafness in her left ear which has contributed to her being clumsy for decades. She has had several falls in the past year and reports of a right foot injury. She is having trouble stopping herself from falling. She also had right leg pain for the past year that is only at night and will wake her  up.    Limitations Walking;Lifting;Standing;House hold activities    How long can you sit comfortably? 2-3 hours    How long can you stand comfortably? 15-20 minutes    How long can you walk comfortably? 30 minutes    Patient Stated Goals "I want to get better in my balance skills and get stronger."    Currently in Pain? No/denies             NuStep x L1 x 5 minutes  Step ups on 6" x 10 reps each leg x forward/sidesteps  Mini squats x 2 10 reps with BUE support Heel raises x 2 10 reps with BUE support  Standing hip 3 way (flex/abd/ext) x GTB x 2 10 reps each leg   [place] Balance: Penguin race x 3 reps Maze x 1 rep x level 1  Precor Leg press: 40# x 2 10 reps        PT Education - 04/09/20 1815    Education Details HEP: mini squats, heel raises, SLR    Person(s) Educated Patient    Methods Explanation;Handout;Demonstration;Verbal cues    Comprehension Verbalized understanding            PT Short Term Goals - 04/02/20 1000      PT SHORT TERM GOAL #1   Title Patient will be independent in home exercise program to improve strength/mobility for better functional independence with ADLs.    Time 4    Period Weeks    Status New    Target Date 04/30/20      PT SHORT TERM GOAL #2   Title Patient will deny any falls over past 4 weeks to demonstrate improved safety awareness at home and work.    Time 4    Period Weeks    Status New    Target Date 04/30/20             PT Long Term Goals - 04/02/20 1000      PT LONG TERM GOAL #1   Title Patient will increase FOTO score to equal to or greater than 64 to demonstrate statistically significant improvement in mobility and quality of life.    Baseline 12/29: 54    Time 8    Period Weeks    Status New    Target Date 05/28/20      PT LONG TERM GOAL #2   Title Patient will increase Berg Balance score by > 6 points to demonstrate decreased fall risk during functional activities.    Baseline 12/29: 40/56    Time 8     Period Weeks    Status New  Target Date 05/28/20      PT LONG TERM GOAL #3   Title Patient will reduce timed up and go to <11 seconds to reduce fall risk and demonstrate improved transfer/gait ability.    Baseline 12/29: 12.28s    Time 8    Period Weeks    Status New    Target Date 05/28/20      PT LONG TERM GOAL #4   Title Patient will increase ABC scale score >80% to demonstrate better functional mobility and better confidence with ADLs    Baseline 12/29: 73.125%    Time 8    Period Weeks    Status New    Target Date 05/28/20      PT LONG TERM GOAL #5   Title Patient will tolerate 5 seconds of single leg stance without loss of balance to improve ability to get in and out of shower safely.    Baseline 12/29: < 3 seconds of SLS    Time 8    Period Weeks    Status New    Target Date 05/28/20                 Plan - 04/09/20 1816    Clinical Impression Statement Patient tolerated strengthening exercises with minimal increase in pain. She required seated rest breaks to recover from muscle fatigue due to decreased tissue capacity to load R hip/knee joint. Patient completed balance exercises with increased postural swaying due to decrased proprioception cues secondary to history of multiple ankle surgeries. Patient would benefit from continued PT services to increase strength, mobility, and balance skills to improve patient's quality of life.    Personal Factors and Comorbidities Comorbidity 3+    Comorbidities fall risk, osteoporosis, mixed hyperlipidemia, arthralgia, myaglia    Examination-Activity Limitations Reach Overhead;Stairs;Stand;Bend;Lift    Examination-Participation Restrictions Volunteer    Stability/Clinical Decision Making Evolving/Moderate complexity    Rehab Potential Good    PT Frequency 2x / week    PT Duration 8 weeks    PT Treatment/Interventions Gait training;Therapeutic activities;Therapeutic exercise;Balance training;Neuromuscular  re-education;Patient/family education;Energy conservation;Aquatic Therapy;ADLs/Self Care Home Management;Electrical Stimulation;Moist Heat;Functional mobility training;Stair training    PT Next Visit Plan Progress balance/strength exercises    PT Home Exercise Plan HEP: SLR, mini squats, heel raises    Consulted and Agree with Plan of Care Patient           Patient will benefit from skilled therapeutic intervention in order to improve the following deficits and impairments:  Abnormal gait,Decreased balance,Decreased endurance,Decreased mobility,Difficulty walking,Improper body mechanics,Decreased activity tolerance,Decreased safety awareness,Decreased strength  Visit Diagnosis: Muscle weakness (generalized)  Unsteadiness on feet  Repeated falls  Other abnormalities of gait and mobility     Problem List Patient Active Problem List   Diagnosis Date Noted  . Balance problems 02/18/2020  . Mixed hyperlipidemia 05/28/2019  . Arthralgia 08/30/2018  . Myalgia 08/29/2018  . Vitamin D deficiency 08/29/2018  . Osteoporosis 06/14/2018  . Closed fracture of one rib of left side 04/03/2018  . Atypical lymphocytes present on peripheral blood smear 06/02/2016  . Family history of colon cancer-brother at age 8 05/27/2016  . S/P laparoscopic sleeve gastrectomy 12/18/2013  . Persistent disorder of initiating or maintaining sleep 11/05/2013  . Disorder of bone and cartilage 10/17/2009   Karl Luke PT, DPT Netta Corrigan 04/09/2020, 6:29 PM  [place] MAIN [facility] SERVICES [address] [place], [place], [address] Phone: [phone]   Fax:  [phone]  Name: [name] [name] MRN: [id number] Date of Birth: January 11, 1951

## 2020-04-10 ENCOUNTER — Ambulatory Visit: Payer: Medicare PPO

## 2020-04-16 ENCOUNTER — Other Ambulatory Visit: Payer: Self-pay

## 2020-04-16 ENCOUNTER — Ambulatory Visit: Payer: Medicare PPO | Admitting: Physical Therapy

## 2020-04-16 DIAGNOSIS — R2681 Unsteadiness on feet: Secondary | ICD-10-CM | POA: Diagnosis not present

## 2020-04-16 DIAGNOSIS — R2689 Other abnormalities of gait and mobility: Secondary | ICD-10-CM | POA: Diagnosis not present

## 2020-04-16 DIAGNOSIS — R296 Repeated falls: Secondary | ICD-10-CM

## 2020-04-16 DIAGNOSIS — M6281 Muscle weakness (generalized): Secondary | ICD-10-CM | POA: Diagnosis not present

## 2020-04-16 NOTE — Therapy (Signed)
[place] MAIN [facility] SERVICES [place], [place], [address] Phone: [phone]   Fax:  [phone]  Physical Therapy Treatment  Patient Details  Name: [name] [name] MRN: [id number] Date of Birth: Oct 25, 1950 Referring Provider (PT): Dr. Einar Pheasant   Encounter Date: 04/16/2020   PT End of Session - 04/16/20 1526    Visit Number 4    Number of Visits 17    Date for PT Re-Evaluation 05/28/20    Authorization Type eval: 04/02/20    PT Start Time 1515    PT Stop Time 1600    PT Time Calculation (min) 45 min    Activity Tolerance Patient tolerated treatment well    Behavior During Therapy [facility] for tasks assessed/performed           Past Medical History:  Diagnosis Date  . Allergy   . Anemia    IN THE PAST WITH PREGNANCY  . Arthritis    LEFT FOOT; OCCAS LOWER BACK PAIN - HX OF VERTEBRAL FRACTURES  . Blood transfusion without reported diagnosis    DURING PREGNANCY  . Cataract    removed both eyes   . Complication of anesthesia    a little sluggist when waking up-no intervention required  . Constipation    uses Colace 3 x a day and Dulcolax q week- with this has soft BM's- no pain   . Coronary artery spasm (HCC) 1990'S   AFTER OTC DIET PILLS - PT EXPERIENCED CHEST PAIN- PT HAD CARDIAC CATH - TOLD NO CAD - THOUGHT TO HAVE HAS CORNARY ARTERY SPASM  . GERD (gastroesophageal reflux disease)    mild   . H/O seasonal allergies   . Headache(784.0)    OCCAS SINUS HEADACHES  . Hearing impaired    LEFT EAR DEAFNESS  . Hypertension    220/104 IN SPRING 2015 - SAW HER DOCTOR - BUT B/P NORMALIZED - PT DECIDED TO STOP CAFFEINE AND HAS NOT HAD ANY ELEVATED PRESSURES SINCE JUNE  . Osteopenia   . Strabismus    HAD STRABISMUS SURGERY 1956 - BUT STILL HAS STRABISMUS  . Vitamin D deficiency     Past Surgical History:  Procedure Laterality Date  . ABDOMINAL HYSTERECTOMY    . BONE ANCHORED HEARING AID IMPLANT Left 01/26/2019    Procedure: BONE ANCHORED HEARING AID (BAHA) IMPLANT;  Surgeon: [name], MD;  Location: [place];  Service: ENT;  Laterality: Left;  . BREATH TEK H PYLORI N/A 10/08/2013   Procedure: BREATH TEK H PYLORI;  Surgeon: Gayland Curry, MD;  Location: [facility];  Service: General;  Laterality: N/A;  . BREATH TEK H PYLORI N/A 12/03/2013   Procedure: BREATH TEK Kandis Ban;  Surgeon: Gayland Curry, MD;  Location: [facility];  Service: General;  Laterality: N/A;  . CHOLECYSTECTOMY N/A 04/13/2016   Procedure: LAPAROSCOPIC CHOLECYSTECTOMY WITH ATTEMPTED INTRAOPERATIVE CHOLANGIOGRAM;  Surgeon: Greer Pickerel, MD;  Location: [place];  Service: General;  Laterality: N/A;  . COLONOSCOPY    . EYE SURGERY Bilateral 2017   cataract surgery with lens implant  . INNER EAR SURGERY Left 1965   RADICAL MASTOIDECTOMY  . LAPAROSCOPIC GASTRIC SLEEVE RESECTION N/A 12/18/2013   Procedure: LAPAROSCOPIC GASTRIC SLEEVE RESECTION;  Surgeon: Greer Pickerel, MD;  Location: [facility];  Service: General;  Laterality: N/A;  . REPAIR TENDONS FOOT Right 1994   AND RECONSTRUCTIVE SURGERY FOR MULTIPLE FRACTURES OF THE RIGHT FOOT  . STOMACH SURGERY    . strabismus repair    .  TONSILLECTOMY    . UPPER GASTROINTESTINAL ENDOSCOPY      There were no vitals filed for this visit.   Subjective Assessment - 04/16/20 1519    Subjective Patient reports that her bilateral ankle/foot were aching after last therapy session which was relieved with some OTC pain relief medicaiton. Patient denies of any falls or injuries since last therapy session.    Pertinent History 70 y.o. female presents to skilled physical therapy with a referral for balance deficits. She has a past history of an hysterectomy. She has deafness in her left ear which has contributed to her being clumsy for decades. She has had several falls in the past year and reports of a right foot injury. She is having trouble stopping herself from falling. She also had right leg pain for the past year  that is only at night and will wake her up.    Limitations Walking;Lifting;Standing;House hold activities    How long can you sit comfortably? 2-3 hours    How long can you stand comfortably? 15-20 minutes    How long can you walk comfortably? 30 minutes    Patient Stated Goals "I want to get better in my balance skills and get stronger."    Currently in Pain? No/denies           NuStep x L1 x 5 minutes  Step ups on stairs in gym x 10 reps each leg x forward/sidesteps with 2.5#  Mini squats x 2 10 reps with BUE support Heel raises x 2 10 reps with BUE support  Standing hip 3 way (flex/abd/ext) x 2.5# x 3 10 reps each leg   Practicing step ups in hallway stairs with reciprocal ascend/descend  Sit to stands x 11, x 9,  x 7, x 5 without BUE support       PT Short Term Goals - 04/02/20 1000      PT SHORT TERM GOAL #1   Title Patient will be independent in home exercise program to improve strength/mobility for better functional independence with ADLs.    Time 4    Period Weeks    Status New    Target Date 04/30/20      PT SHORT TERM GOAL #2   Title Patient will deny any falls over past 4 weeks to demonstrate improved safety awareness at home and work.    Time 4    Period Weeks    Status New    Target Date 04/30/20             PT Long Term Goals - 04/02/20 1000      PT LONG TERM GOAL #1   Title Patient will increase FOTO score to equal to or greater than 64 to demonstrate statistically significant improvement in mobility and quality of life.    Baseline 12/29: 54    Time 8    Period Weeks    Status New    Target Date 05/28/20      PT LONG TERM GOAL #2   Title Patient will increase Berg Balance score by > 6 points to demonstrate decreased fall risk during functional activities.    Baseline 12/29: 40/56    Time 8    Period Weeks    Status New    Target Date 05/28/20      PT LONG TERM GOAL #3   Title Patient will reduce timed up and go to <11 seconds to  reduce fall risk and demonstrate improved transfer/gait ability.  Baseline 12/29: 12.28s    Time 8    Period Weeks    Status New    Target Date 05/28/20      PT LONG TERM GOAL #4   Title Patient will increase ABC scale score >80% to demonstrate better functional mobility and better confidence with ADLs    Baseline 12/29: 73.125%    Time 8    Period Weeks    Status New    Target Date 05/28/20      PT LONG TERM GOAL #5   Title Patient will tolerate 5 seconds of single leg stance without loss of balance to improve ability to get in and out of shower safely.    Baseline 12/29: < 3 seconds of SLS    Time 8    Period Weeks    Status New    Target Date 05/28/20                 Plan - 04/16/20 1855    Clinical Impression Statement Patient completed strengthening exercises with fair tolerance to activity. She demonstrates increased muscle fatigue with closed chain and weight bearing exercises due to decreased tissue capacity to load knee joints. Patient completed stair training with recirprocal ascend/descend with difficulty. Therapist provided verbal cues on how to avoid pulling up on railings when navigating stairs. Patient would benefit from continued PT services to increase strength, mobility, and balance to improve patient's quality of life.    Personal Factors and Comorbidities Comorbidity 3+    Comorbidities fall risk, osteoporosis, mixed hyperlipidemia, arthralgia, myaglia    Examination-Activity Limitations Reach Overhead;Stairs;Stand;Bend;Lift    Examination-Participation Restrictions Volunteer    Stability/Clinical Decision Making Evolving/Moderate complexity    Rehab Potential Good    PT Frequency 2x / week    PT Duration 8 weeks    PT Treatment/Interventions Gait training;Therapeutic activities;Therapeutic exercise;Balance training;Neuromuscular re-education;Patient/family education;Energy conservation;Aquatic Therapy;ADLs/Self Care Home Management;Electrical  Stimulation;Moist Heat;Functional mobility training;Stair training    PT Next Visit Plan Progress balance/strength exercises    PT Home Exercise Plan HEP: SLR, mini squats, heel raises    Consulted and Agree with Plan of Care Patient           Patient will benefit from skilled therapeutic intervention in order to improve the following deficits and impairments:  Abnormal gait,Decreased balance,Decreased endurance,Decreased mobility,Difficulty walking,Improper body mechanics,Decreased activity tolerance,Decreased safety awareness,Decreased strength  Visit Diagnosis: Muscle weakness (generalized)  Unsteadiness on feet  Repeated falls  Other abnormalities of gait and mobility     Problem List Patient Active Problem List   Diagnosis Date Noted  . Balance problems 02/18/2020  . Mixed hyperlipidemia 05/28/2019  . Arthralgia 08/30/2018  . Myalgia 08/29/2018  . Vitamin D deficiency 08/29/2018  . Osteoporosis 06/14/2018  . Closed fracture of one rib of left side 04/03/2018  . Atypical lymphocytes present on peripheral blood smear 06/02/2016  . Family history of colon cancer-brother at age 75 05/27/2016  . S/P laparoscopic sleeve gastrectomy 12/18/2013  . Persistent disorder of initiating or maintaining sleep 11/05/2013  . Disorder of bone and cartilage 10/17/2009   Karl Luke PT, DPT Netta Corrigan 04/16/2020, 6:58 PM  [place] MAIN [facility] SERVICES [address] [place], [place], [address] Phone: [phone]   Fax:  [phone]  Name: Mollee Aguas MRN: [id number] Date of Birth: May 27, 1950

## 2020-04-18 ENCOUNTER — Other Ambulatory Visit: Payer: Self-pay

## 2020-04-18 ENCOUNTER — Ambulatory Visit: Payer: Medicare PPO

## 2020-04-18 DIAGNOSIS — M6281 Muscle weakness (generalized): Secondary | ICD-10-CM

## 2020-04-18 DIAGNOSIS — R296 Repeated falls: Secondary | ICD-10-CM

## 2020-04-18 DIAGNOSIS — R2689 Other abnormalities of gait and mobility: Secondary | ICD-10-CM | POA: Diagnosis not present

## 2020-04-18 DIAGNOSIS — R2681 Unsteadiness on feet: Secondary | ICD-10-CM

## 2020-04-18 NOTE — Therapy (Signed)
[place] MAIN [facility] SERVICES [address] [place], [place], [address] Phone: [phone]   Fax:  [phone]  Physical Therapy Treatment  Patient Details  Name: [name] [name] MRN: [id number] Date of Birth: 1950/10/14 Referring Provider (PT): Dr. Einar Pheasant   Encounter Date: 04/18/2020   PT End of Session - 04/18/20 1057    Visit Number 5    Number of Visits 17    Date for PT Re-Evaluation 05/28/20    Authorization Type eval: 04/02/20    PT Start Time 0930    PT Stop Time 1012    PT Time Calculation (min) 42 min    Equipment Utilized During Treatment Gait belt    Activity Tolerance Patient tolerated treatment well    Behavior During Therapy [facility] for tasks assessed/performed           Past Medical History:  Diagnosis Date  . Allergy   . Anemia    IN THE PAST WITH PREGNANCY  . Arthritis    LEFT FOOT; OCCAS LOWER BACK PAIN - HX OF VERTEBRAL FRACTURES  . Blood transfusion without reported diagnosis    DURING PREGNANCY  . Cataract    removed both eyes   . Complication of anesthesia    a little sluggist when waking up-no intervention required  . Constipation    uses Colace 3 x a day and Dulcolax q week- with this has soft BM's- no pain   . Coronary artery spasm (HCC) 1990'S   AFTER OTC DIET PILLS - PT EXPERIENCED CHEST PAIN- PT HAD CARDIAC CATH - TOLD NO CAD - THOUGHT TO HAVE HAS CORNARY ARTERY SPASM  . GERD (gastroesophageal reflux disease)    mild   . H/O seasonal allergies   . Headache(784.0)    OCCAS SINUS HEADACHES  . Hearing impaired    LEFT EAR DEAFNESS  . Hypertension    220/104 IN SPRING 2015 - SAW HER DOCTOR - BUT B/P NORMALIZED - PT DECIDED TO STOP CAFFEINE AND HAS NOT HAD ANY ELEVATED PRESSURES SINCE JUNE  . Osteopenia   . Strabismus    HAD STRABISMUS SURGERY 1956 - BUT STILL HAS STRABISMUS  . Vitamin D deficiency     Past Surgical History:  Procedure Laterality Date  . ABDOMINAL HYSTERECTOMY    . BONE  ANCHORED HEARING AID IMPLANT Left 01/26/2019   Procedure: BONE ANCHORED HEARING AID (BAHA) IMPLANT;  Surgeon: [name], MD;  Location: [place];  Service: ENT;  Laterality: Left;  . BREATH TEK H PYLORI N/A 10/08/2013   Procedure: BREATH TEK H PYLORI;  Surgeon: Gayland Curry, MD;  Location: [facility];  Service: General;  Laterality: N/A;  . BREATH TEK H PYLORI N/A 12/03/2013   Procedure: BREATH TEK Kandis Ban;  Surgeon: Gayland Curry, MD;  Location: [facility];  Service: General;  Laterality: N/A;  . CHOLECYSTECTOMY N/A 04/13/2016   Procedure: LAPAROSCOPIC CHOLECYSTECTOMY WITH ATTEMPTED INTRAOPERATIVE CHOLANGIOGRAM;  Surgeon: Greer Pickerel, MD;  Location: [place];  Service: General;  Laterality: N/A;  . COLONOSCOPY    . EYE SURGERY Bilateral 2017   cataract surgery with lens implant  . INNER EAR SURGERY Left 1965   RADICAL MASTOIDECTOMY  . LAPAROSCOPIC GASTRIC SLEEVE RESECTION N/A 12/18/2013   Procedure: LAPAROSCOPIC GASTRIC SLEEVE RESECTION;  Surgeon: Greer Pickerel, MD;  Location: [facility];  Service: General;  Laterality: N/A;  . REPAIR TENDONS FOOT Right 1994   AND RECONSTRUCTIVE SURGERY FOR MULTIPLE FRACTURES OF THE RIGHT FOOT  . STOMACH SURGERY    .  strabismus repair    . TONSILLECTOMY    . UPPER GASTROINTESTINAL ENDOSCOPY      There were no vitals filed for this visit.   Subjective Assessment - 04/18/20 0936    Subjective The patient reports that her muscles are still a good sore from last session.  The patient reports that she feels as though she is doing better and overall feels more steady.    Pertinent History 70 y.o. female presents to skilled physical therapy with a referral for balance deficits. She has a past history of an hysterectomy. She has deafness in her left ear which has contributed to her being clumsy for decades. She has had several falls in the past year and reports of a right foot injury. She is having trouble stopping herself from falling. She also had right leg pain for the  past year that is only at night and will wake her up.    Limitations Walking;Lifting;Standing;House hold activities    How long can you sit comfortably? 2-3 hours    How long can you stand comfortably? 15-20 minutes    How long can you walk comfortably? 30 minutes    Patient Stated Goals "I want to get better in my balance skills and get stronger."    Currently in Pain? No/denies          Nu Step L2 x5 mins  CGA for balance activities Airex EO/EC x NBOS 30"x2 each Airex Tandem stance 30"x2 each Bosu lunges 3"x10 each Hurdle step overs f/b x10 each Hurdle side stepping x10 each  [place] Balance Tux Racer- bronze race approx 5 mins Maze- approx 5 mins pt with UE support during [employer], good weight shifting side to side, decreased posteriorly                       PT Short Term Goals - 04/02/20 1000      PT SHORT TERM GOAL #1   Title Patient will be independent in home exercise program to improve strength/mobility for better functional independence with ADLs.    Time 4    Period Weeks    Status New    Target Date 04/30/20      PT SHORT TERM GOAL #2   Title Patient will deny any falls over past 4 weeks to demonstrate improved safety awareness at home and work.    Time 4    Period Weeks    Status New    Target Date 04/30/20             PT Long Term Goals - 04/02/20 1000      PT LONG TERM GOAL #1   Title Patient will increase FOTO score to equal to or greater than 64 to demonstrate statistically significant improvement in mobility and quality of life.    Baseline 12/29: 54    Time 8    Period Weeks    Status New    Target Date 05/28/20      PT LONG TERM GOAL #2   Title Patient will increase Berg Balance score by > 6 points to demonstrate decreased fall risk during functional activities.    Baseline 12/29: 40/56    Time 8    Period Weeks    Status New    Target Date 05/28/20      PT LONG TERM GOAL #3   Title Patient will reduce  timed up and go to <11 seconds to reduce fall risk and demonstrate  improved transfer/gait ability.    Baseline 12/29: 12.28s    Time 8    Period Weeks    Status New    Target Date 05/28/20      PT LONG TERM GOAL #4   Title Patient will increase ABC scale score >80% to demonstrate better functional mobility and better confidence with ADLs    Baseline 12/29: 73.125%    Time 8    Period Weeks    Status New    Target Date 05/28/20      PT LONG TERM GOAL #5   Title Patient will tolerate 5 seconds of single leg stance without loss of balance to improve ability to get in and out of shower safely.    Baseline 12/29: < 3 seconds of SLS    Time 8    Period Weeks    Status New    Target Date 05/28/20                 Plan - 04/18/20 1059    Clinical Impression Statement The patient's program focused on balance this visit.  The patient demonstrates adequate weightshifting forward and side to side and less posteriorly this visit on [place].  Patient's balance was challenged on uneven surfaces with heavy ankle strategies bilaterally.  The patient continues to benefit from additional skilled PT services to further improve LE strength and overall balance for decreased fall risk.    Personal Factors and Comorbidities Comorbidity 3+    Comorbidities fall risk, osteoporosis, mixed hyperlipidemia, arthralgia, myaglia    Examination-Activity Limitations Reach Overhead;Stairs;Stand;Bend;Lift    Examination-Participation Restrictions Volunteer    Stability/Clinical Decision Making Evolving/Moderate complexity    Rehab Potential Good    PT Frequency 2x / week    PT Duration 8 weeks    PT Treatment/Interventions Gait training;Therapeutic activities;Therapeutic exercise;Balance training;Neuromuscular re-education;Patient/family education;Energy conservation;Aquatic Therapy;ADLs/Self Care Home Management;Electrical Stimulation;Moist Heat;Functional mobility training;Stair training    PT  Next Visit Plan Progress balance/strength exercises    PT Home Exercise Plan HEP: SLR, mini squats, heel raises    Consulted and Agree with Plan of Care Patient           Patient will benefit from skilled therapeutic intervention in order to improve the following deficits and impairments:  Abnormal gait,Decreased balance,Decreased endurance,Decreased mobility,Difficulty walking,Improper body mechanics,Decreased activity tolerance,Decreased safety awareness,Decreased strength  Visit Diagnosis: Muscle weakness (generalized)  Unsteadiness on feet  Repeated falls  Other abnormalities of gait and mobility     Problem List Patient Active Problem List   Diagnosis Date Noted  . Balance problems 02/18/2020  . Mixed hyperlipidemia 05/28/2019  . Arthralgia 08/30/2018  . Myalgia 08/29/2018  . Vitamin D deficiency 08/29/2018  . Osteoporosis 06/14/2018  . Closed fracture of one rib of left side 04/03/2018  . Atypical lymphocytes present on peripheral blood smear 06/02/2016  . Family history of colon cancer-brother at age 40 05/27/2016  . S/P laparoscopic sleeve gastrectomy 12/18/2013  . Persistent disorder of initiating or maintaining sleep 11/05/2013  . Disorder of bone and cartilage 10/17/2009    Hal Morales PT, DPT 04/18/2020, 11:05 AM  [place] MAIN [facility] SERVICES [address] [place], [place], [address] Phone: [phone]   Fax:  [phone]  Name: [name] [name] MRN: [id number] Date of Birth: 07-24-1950

## 2020-04-21 ENCOUNTER — Ambulatory Visit: Payer: Medicare PPO

## 2020-04-23 ENCOUNTER — Ambulatory Visit: Payer: Medicare PPO

## 2020-04-23 ENCOUNTER — Other Ambulatory Visit: Payer: Self-pay

## 2020-04-23 DIAGNOSIS — R296 Repeated falls: Secondary | ICD-10-CM | POA: Diagnosis not present

## 2020-04-23 DIAGNOSIS — M6281 Muscle weakness (generalized): Secondary | ICD-10-CM | POA: Diagnosis not present

## 2020-04-23 DIAGNOSIS — R2681 Unsteadiness on feet: Secondary | ICD-10-CM | POA: Diagnosis not present

## 2020-04-23 DIAGNOSIS — R2689 Other abnormalities of gait and mobility: Secondary | ICD-10-CM | POA: Diagnosis not present

## 2020-04-23 NOTE — Therapy (Signed)
[place] MAIN [facility] SERVICES [address] [place], [place], [address] Phone: [phone]   Fax:  [phone]  Physical Therapy Treatment  Patient Details  Name: [name] [name] MRN: [id number] Date of Birth: 1950/06/12 Referring Provider (PT): Dr. Einar Pheasant   Encounter Date: 04/23/2020   PT End of Session - 04/23/20 1157    Visit Number 6    Number of Visits 17    Date for PT Re-Evaluation 05/28/20    Authorization Type eval: 04/02/20    PT Start Time 1058    PT Stop Time 1147    PT Time Calculation (min) 49 min    Equipment Utilized During Treatment Gait belt    Activity Tolerance Patient tolerated treatment well    Behavior During Therapy [facility] for tasks assessed/performed           Past Medical History:  Diagnosis Date  . Allergy   . Anemia    IN THE PAST WITH PREGNANCY  . Arthritis    LEFT FOOT; OCCAS LOWER BACK PAIN - HX OF VERTEBRAL FRACTURES  . Blood transfusion without reported diagnosis    DURING PREGNANCY  . Cataract    removed both eyes   . Complication of anesthesia    a little sluggist when waking up-no intervention required  . Constipation    uses Colace 3 x a day and Dulcolax q week- with this has soft BM's- no pain   . Coronary artery spasm (HCC) 1990'S   AFTER OTC DIET PILLS - PT EXPERIENCED CHEST PAIN- PT HAD CARDIAC CATH - TOLD NO CAD - THOUGHT TO HAVE HAS CORNARY ARTERY SPASM  . GERD (gastroesophageal reflux disease)    mild   . H/O seasonal allergies   . Headache(784.0)    OCCAS SINUS HEADACHES  . Hearing impaired    LEFT EAR DEAFNESS  . Hypertension    220/104 IN SPRING 2015 - SAW HER DOCTOR - BUT B/P NORMALIZED - PT DECIDED TO STOP CAFFEINE AND HAS NOT HAD ANY ELEVATED PRESSURES SINCE JUNE  . Osteopenia   . Strabismus    HAD STRABISMUS SURGERY 1956 - BUT STILL HAS STRABISMUS  . Vitamin D deficiency     Past Surgical History:  Procedure Laterality Date  . ABDOMINAL HYSTERECTOMY    . BONE  ANCHORED HEARING AID IMPLANT Left 01/26/2019   Procedure: BONE ANCHORED HEARING AID (BAHA) IMPLANT;  Surgeon: [name], MD;  Location: [place];  Service: ENT;  Laterality: Left;  . BREATH TEK H PYLORI N/A 10/08/2013   Procedure: BREATH TEK H PYLORI;  Surgeon: Gayland Curry, MD;  Location: [facility];  Service: General;  Laterality: N/A;  . BREATH TEK H PYLORI N/A 12/03/2013   Procedure: BREATH TEK Kandis Ban;  Surgeon: Gayland Curry, MD;  Location: [facility];  Service: General;  Laterality: N/A;  . CHOLECYSTECTOMY N/A 04/13/2016   Procedure: LAPAROSCOPIC CHOLECYSTECTOMY WITH ATTEMPTED INTRAOPERATIVE CHOLANGIOGRAM;  Surgeon: Greer Pickerel, MD;  Location: [place];  Service: General;  Laterality: N/A;  . COLONOSCOPY    . EYE SURGERY Bilateral 2017   cataract surgery with lens implant  . INNER EAR SURGERY Left 1965   RADICAL MASTOIDECTOMY  . LAPAROSCOPIC GASTRIC SLEEVE RESECTION N/A 12/18/2013   Procedure: LAPAROSCOPIC GASTRIC SLEEVE RESECTION;  Surgeon: Greer Pickerel, MD;  Location: [facility];  Service: General;  Laterality: N/A;  . REPAIR TENDONS FOOT Right 1994   AND RECONSTRUCTIVE SURGERY FOR MULTIPLE FRACTURES OF THE RIGHT FOOT  . STOMACH SURGERY    .  strabismus repair    . TONSILLECTOMY    . UPPER GASTROINTESTINAL ENDOSCOPY      There were no vitals filed for this visit.   Subjective Assessment - 04/23/20 1100    Subjective Pt reports her joints feel a little more achey today and that she's been more sedentary due to the weather. Pt reports pain 2-3/10 but not enough to use pain medication. Pt reports she slid a bit on the ice when taking her dog out this morning but she caught herself by grabbing her car which prevented her fall.    Pertinent History 70 y.o. female presents to skilled physical therapy with a referral for balance deficits. She has a past history of an hysterectomy. She has deafness in her left ear which has contributed to her being clumsy for decades. She has had several falls in  the past year and reports of a right foot injury. She is having trouble stopping herself from falling. She also had right leg pain for the past year that is only at night and will wake her up.    Limitations Walking;Lifting;Standing;House hold activities    How long can you sit comfortably? 2-3 hours    How long can you stand comfortably? 15-20 minutes    How long can you walk comfortably? 30 minutes    Patient Stated Goals "I want to get better in my balance skills and get stronger."    Currently in Pain? Yes    Pain Score 3     Pain Location Other (Comment)   pt reports generalized joint pain          Nu Step L2 x5 mins   CGA for all static balance activities performed: Airex EO and EC x WBOS and NBOS 2x30 sec each Airex Tandem stance EO and EC 2x60 sec per each LE Airex NBOS with horizontal/vertical head turns 1x30 sec each Airex tandem stance with vertical/horizontal head turns 1x30 sec each LE  CGA- min assist for dynamic balance: Obstacle course navigating cones, stepping onto and over objects/compliant surfaces with forward/backward/lateral stepping off/on compliant surface x multiple repetitions.     PT Education - 04/23/20 1156    Education Details Pt educated on technique with balance exercises    Person(s) Educated Patient    Methods Explanation;Demonstration;Verbal cues    Comprehension Verbalized understanding;Returned demonstration            PT Short Term Goals - 04/02/20 1000      PT SHORT TERM GOAL #1   Title Patient will be independent in home exercise program to improve strength/mobility for better functional independence with ADLs.    Time 4    Period Weeks    Status New    Target Date 04/30/20      PT SHORT TERM GOAL #2   Title Patient will deny any falls over past 4 weeks to demonstrate improved safety awareness at home and work.    Time 4    Period Weeks    Status New    Target Date 04/30/20             PT Long Term Goals - 04/02/20 1000       PT LONG TERM GOAL #1   Title Patient will increase FOTO score to equal to or greater than 64 to demonstrate statistically significant improvement in mobility and quality of life.    Baseline 12/29: 54    Time 8    Period Weeks    Status New  Target Date 05/28/20      PT LONG TERM GOAL #2   Title Patient will increase Berg Balance score by > 6 points to demonstrate decreased fall risk during functional activities.    Baseline 12/29: 40/56    Time 8    Period Weeks    Status New    Target Date 05/28/20      PT LONG TERM GOAL #3   Title Patient will reduce timed up and go to <11 seconds to reduce fall risk and demonstrate improved transfer/gait ability.    Baseline 12/29: 12.28s    Time 8    Period Weeks    Status New    Target Date 05/28/20      PT LONG TERM GOAL #4   Title Patient will increase ABC scale score >80% to demonstrate better functional mobility and better confidence with ADLs    Baseline 12/29: 73.125%    Time 8    Period Weeks    Status New    Target Date 05/28/20      PT LONG TERM GOAL #5   Title Patient will tolerate 5 seconds of single leg stance without loss of balance to improve ability to get in and out of shower safely.    Baseline 12/29: < 3 seconds of SLS    Time 8    Period Weeks    Status New    Target Date 05/28/20                 Plan - 04/23/20 1158    Clinical Impression Statement Pt with improvement in static balance performance today compared to previous session with pt reporting static exercises seeming easier, indicating functional carryover between sessions. Pt still does use mid-gaurd to assist with static balance on compliant surfaces. Pt greatest difficulty with obstacle course and navigating over compliant obstacles with greater decrease observed in R LE stability compared to L LE.  Pt will benefit from further skilled therapy to increase B LE strength and static and dynamic balance in order to decrease fall risk.     Personal Factors and Comorbidities Comorbidity 3+    Comorbidities fall risk, osteoporosis, mixed hyperlipidemia, arthralgia, myaglia    Examination-Activity Limitations Reach Overhead;Stairs;Stand;Bend;Lift    Examination-Participation Restrictions Volunteer    Stability/Clinical Decision Making Evolving/Moderate complexity    Rehab Potential Good    PT Frequency 2x / week    PT Duration 8 weeks    PT Treatment/Interventions Gait training;Therapeutic activities;Therapeutic exercise;Balance training;Neuromuscular re-education;Patient/family education;Energy conservation;Aquatic Therapy;ADLs/Self Care Home Management;Electrical Stimulation;Moist Heat;Functional mobility training;Stair training    PT Next Visit Plan Progress dynamic balance exercises/obstacle negotiation    PT Home Exercise Plan HEP: SLR, mini squats, heel raises    Consulted and Agree with Plan of Care Patient           Patient will benefit from skilled therapeutic intervention in order to improve the following deficits and impairments:  Abnormal gait,Decreased balance,Decreased endurance,Decreased mobility,Difficulty walking,Improper body mechanics,Decreased activity tolerance,Decreased safety awareness,Decreased strength  Visit Diagnosis: Unsteadiness on feet  Repeated falls  Muscle weakness (generalized)  Other abnormalities of gait and mobility     Problem List Patient Active Problem List   Diagnosis Date Noted  . Balance problems 02/18/2020  . Mixed hyperlipidemia 05/28/2019  . Arthralgia 08/30/2018  . Myalgia 08/29/2018  . Vitamin D deficiency 08/29/2018  . Osteoporosis 06/14/2018  . Closed fracture of one rib of left side 04/03/2018  . Atypical lymphocytes present on peripheral blood smear 06/02/2016  .  Family history of colon cancer-brother at age 67 05/27/2016  . S/P laparoscopic sleeve gastrectomy 12/18/2013  . Persistent disorder of initiating or maintaining sleep 11/05/2013  . Disorder of bone  and cartilage 10/17/2009   Ricard Dillon PT, DPT  04/23/2020, 12:09 PM  [place] MAIN [facility] SERVICES [address] [place], [place], [address] Phone: [phone]   Fax:  [phone]  Name: [name] [name] MRN: [id number] Date of Birth: [birth year]

## 2020-04-28 ENCOUNTER — Ambulatory Visit: Payer: Medicare PPO | Admitting: Physical Therapy

## 2020-04-28 ENCOUNTER — Other Ambulatory Visit: Payer: Self-pay

## 2020-04-28 DIAGNOSIS — R2689 Other abnormalities of gait and mobility: Secondary | ICD-10-CM | POA: Diagnosis not present

## 2020-04-28 DIAGNOSIS — R296 Repeated falls: Secondary | ICD-10-CM

## 2020-04-28 DIAGNOSIS — M6281 Muscle weakness (generalized): Secondary | ICD-10-CM

## 2020-04-28 DIAGNOSIS — R2681 Unsteadiness on feet: Secondary | ICD-10-CM

## 2020-04-28 NOTE — Therapy (Signed)
[place] MAIN [facility] SERVICES [address] [place], [place], [address] Phone: [phone]   Fax:  [phone]  Physical Therapy Treatment  Patient Details  Name: [name] [name] MRN: [id number] Date of Birth: 1951/03/10 Referring Provider (PT): Dr. Einar Pheasant   Encounter Date: 04/28/2020   PT End of Session - 04/28/20 1647    Visit Number 7    Number of Visits 17    Date for PT Re-Evaluation 05/28/20    Authorization Type eval: 04/02/20    PT Start Time 1635    PT Stop Time 1720    PT Time Calculation (min) 45 min    Equipment Utilized During Treatment Gait belt    Activity Tolerance Patient tolerated treatment well    Behavior During Therapy [facility] for tasks assessed/performed           Past Medical History:  Diagnosis Date  . Allergy   . Anemia    IN THE PAST WITH PREGNANCY  . Arthritis    LEFT FOOT; OCCAS LOWER BACK PAIN - HX OF VERTEBRAL FRACTURES  . Blood transfusion without reported diagnosis    DURING PREGNANCY  . Cataract    removed both eyes   . Complication of anesthesia    a little sluggist when waking up-no intervention required  . Constipation    uses Colace 3 x a day and Dulcolax q week- with this has soft BM's- no pain   . Coronary artery spasm (HCC) 1990'S   AFTER OTC DIET PILLS - PT EXPERIENCED CHEST PAIN- PT HAD CARDIAC CATH - TOLD NO CAD - THOUGHT TO HAVE HAS CORNARY ARTERY SPASM  . GERD (gastroesophageal reflux disease)    mild   . H/O seasonal allergies   . Headache(784.0)    OCCAS SINUS HEADACHES  . Hearing impaired    LEFT EAR DEAFNESS  . Hypertension    220/104 IN SPRING 2015 - SAW HER DOCTOR - BUT B/P NORMALIZED - PT DECIDED TO STOP CAFFEINE AND HAS NOT HAD ANY ELEVATED PRESSURES SINCE JUNE  . Osteopenia   . Strabismus    HAD STRABISMUS SURGERY 1956 - BUT STILL HAS STRABISMUS  . Vitamin D deficiency     Past Surgical History:  Procedure Laterality Date  . ABDOMINAL HYSTERECTOMY    . BONE  ANCHORED HEARING AID IMPLANT Left 01/26/2019   Procedure: BONE ANCHORED HEARING AID (BAHA) IMPLANT;  Surgeon: [name], MD;  Location: [place];  Service: ENT;  Laterality: Left;  . BREATH TEK H PYLORI N/A 10/08/2013   Procedure: BREATH TEK H PYLORI;  Surgeon: Gayland Curry, MD;  Location: [facility];  Service: General;  Laterality: N/A;  . BREATH TEK H PYLORI N/A 12/03/2013   Procedure: BREATH TEK Kandis Ban;  Surgeon: Gayland Curry, MD;  Location: [facility];  Service: General;  Laterality: N/A;  . CHOLECYSTECTOMY N/A 04/13/2016   Procedure: LAPAROSCOPIC CHOLECYSTECTOMY WITH ATTEMPTED INTRAOPERATIVE CHOLANGIOGRAM;  Surgeon: Greer Pickerel, MD;  Location: [place];  Service: General;  Laterality: N/A;  . COLONOSCOPY    . EYE SURGERY Bilateral 2017   cataract surgery with lens implant  . INNER EAR SURGERY Left 1965   RADICAL MASTOIDECTOMY  . LAPAROSCOPIC GASTRIC SLEEVE RESECTION N/A 12/18/2013   Procedure: LAPAROSCOPIC GASTRIC SLEEVE RESECTION;  Surgeon: Greer Pickerel, MD;  Location: [facility];  Service: General;  Laterality: N/A;  . REPAIR TENDONS FOOT Right 1994   AND RECONSTRUCTIVE SURGERY FOR MULTIPLE FRACTURES OF THE RIGHT FOOT  . STOMACH SURGERY    .  strabismus repair    . TONSILLECTOMY    . UPPER GASTROINTESTINAL ENDOSCOPY      There were no vitals filed for this visit.   Subjective Assessment - 04/28/20 1732    Subjective Patient reports that her R ankle has been bothering her yesterday and today. She states the ankle doesn't hurt when she's resting but when moves it around then there's an achy pain. She thinks it's due to arthritis and has been up on it all day. She denies of any falls or injuries since last therapy session.    Pertinent History 70 y.o. female presents to skilled physical therapy with a referral for balance deficits. She has a past history of an hysterectomy. She has deafness in her left ear which has contributed to her being clumsy for decades. She has had several falls in the  past year and reports of a right foot injury. She is having trouble stopping herself from falling. She also had right leg pain for the past year that is only at night and will wake her up.    Limitations Walking;Lifting;Standing;House hold activities    How long can you sit comfortably? 2-3 hours    How long can you stand comfortably? 15-20 minutes    How long can you walk comfortably? 30 minutes    Patient Stated Goals "I want to get better in my balance skills and get stronger."    Currently in Pain? Yes    Pain Score 4     Pain Location Ankle    Pain Orientation Right    Pain Descriptors / Indicators Aching          NuStep x L3 x 5 mins   All exercises were completed with a 3# ankle weight Supine SLR hip flexion 2 x 15 BLE; Sidelying straight leg hip abduction 2 x 10 BLE  Hooklying clams 2 x 15 BLE, with GTB; Hooklying bridges with arms across chest 2 x 10; Seated LAQ 2 x 15 BLE; Seated marches x 20 BLE; Seated hip abd/add crossovers 2 x 15 BLE  Clinical Impression: Patient completed strengthening exercises with fair tolerance to activity. Deferred weight bearing exercises due to increased pain in R ankle. Patient reports of increased difficulty on RLE when completing mat exercises due to decreased tissue capacity to repetitive activity. Progressed patient with increased ankle weights to improve functional activity status. She states the exercises that were done today did not put any stress on her but was a challenge for overall BLE strengthening. Patient will continue to benefit from skilled physical therapy to improve generalized strength, ROM, and capacity for functional activity.           PT Short Term Goals - 04/02/20 1000      PT SHORT TERM GOAL #1   Title Patient will be independent in home exercise program to improve strength/mobility for better functional independence with ADLs.    Time 4    Period Weeks    Status New    Target Date 04/30/20      PT SHORT TERM  GOAL #2   Title Patient will deny any falls over past 4 weeks to demonstrate improved safety awareness at home and work.    Time 4    Period Weeks    Status New    Target Date 04/30/20             PT Long Term Goals - 04/02/20 1000      PT LONG TERM GOAL #1  Title Patient will increase FOTO score to equal to or greater than 64 to demonstrate statistically significant improvement in mobility and quality of life.    Baseline 12/29: 54    Time 8    Period Weeks    Status New    Target Date 05/28/20      PT LONG TERM GOAL #2   Title Patient will increase Berg Balance score by > 6 points to demonstrate decreased fall risk during functional activities.    Baseline 12/29: 40/56    Time 8    Period Weeks    Status New    Target Date 05/28/20      PT LONG TERM GOAL #3   Title Patient will reduce timed up and go to <11 seconds to reduce fall risk and demonstrate improved transfer/gait ability.    Baseline 12/29: 12.28s    Time 8    Period Weeks    Status New    Target Date 05/28/20      PT LONG TERM GOAL #4   Title Patient will increase ABC scale score >80% to demonstrate better functional mobility and better confidence with ADLs    Baseline 12/29: 73.125%    Time 8    Period Weeks    Status New    Target Date 05/28/20      PT LONG TERM GOAL #5   Title Patient will tolerate 5 seconds of single leg stance without loss of balance to improve ability to get in and out of shower safely.    Baseline 12/29: < 3 seconds of SLS    Time 8    Period Weeks    Status New    Target Date 05/28/20                 Plan - 04/28/20 1738    Clinical Impression Statement Patient completed strengthening exercises with fair tolerance to activity. Deferred weight bearing exercises due to increased pain in R ankle. Patient reports of increased difficulty on RLE when completing mat exercises due to decreased tissue capacity to repetitive activity. Progressed patient with increased ankle  weights to improve functional activity status. She states the exercises that were done today did not put any stress on her but was a challenge for overall BLE strengthening. Patient will continue to benefit from skilled physical therapy to improve generalized strength, ROM, and capacity for functional activity.    Personal Factors and Comorbidities Comorbidity 3+    Comorbidities fall risk, osteoporosis, mixed hyperlipidemia, arthralgia, myaglia    Examination-Activity Limitations Reach Overhead;Stairs;Stand;Bend;Lift    Examination-Participation Restrictions Volunteer    Stability/Clinical Decision Making Evolving/Moderate complexity    Rehab Potential Good    PT Frequency 2x / week    PT Duration 8 weeks    PT Treatment/Interventions Gait training;Therapeutic activities;Therapeutic exercise;Balance training;Neuromuscular re-education;Patient/family education;Energy conservation;Aquatic Therapy;ADLs/Self Care Home Management;Electrical Stimulation;Moist Heat;Functional mobility training;Stair training    PT Next Visit Plan Progress dynamic balance exercises/obstacle negotiation    PT Home Exercise Plan HEP: SLR, mini squats, heel raises    Consulted and Agree with Plan of Care Patient           Patient will benefit from skilled therapeutic intervention in order to improve the following deficits and impairments:  Abnormal gait,Decreased balance,Decreased endurance,Decreased mobility,Difficulty walking,Improper body mechanics,Decreased activity tolerance,Decreased safety awareness,Decreased strength  Visit Diagnosis: Muscle weakness (generalized)  Unsteadiness on feet  Repeated falls  Other abnormalities of gait and mobility     Problem List Patient Active  Problem List   Diagnosis Date Noted  . Balance problems 02/18/2020  . Mixed hyperlipidemia 05/28/2019  . Arthralgia 08/30/2018  . Myalgia 08/29/2018  . Vitamin D deficiency 08/29/2018  . Osteoporosis 06/14/2018  . Closed  fracture of one rib of left side 04/03/2018  . Atypical lymphocytes present on peripheral blood smear 06/02/2016  . Family history of colon cancer-brother at age 52 05/27/2016  . S/P laparoscopic sleeve gastrectomy 12/18/2013  . Persistent disorder of initiating or maintaining sleep 11/05/2013  . Disorder of bone and cartilage 10/17/2009   Karl Luke PT, DPT Netta Corrigan 04/28/2020, 5:38 PM  [place] MAIN [facility] SERVICES [address] [place], [place], [address] Phone: [phone]   Fax:  [phone]  Name: [name] [name] MRN: [id number] Date of Birth: 1950-05-22

## 2020-04-30 ENCOUNTER — Other Ambulatory Visit: Payer: Self-pay

## 2020-04-30 ENCOUNTER — Ambulatory Visit: Payer: Medicare PPO | Admitting: Physical Therapy

## 2020-04-30 DIAGNOSIS — M6281 Muscle weakness (generalized): Secondary | ICD-10-CM | POA: Diagnosis not present

## 2020-04-30 DIAGNOSIS — R2681 Unsteadiness on feet: Secondary | ICD-10-CM

## 2020-04-30 DIAGNOSIS — R2689 Other abnormalities of gait and mobility: Secondary | ICD-10-CM

## 2020-04-30 DIAGNOSIS — R296 Repeated falls: Secondary | ICD-10-CM | POA: Diagnosis not present

## 2020-04-30 NOTE — Therapy (Signed)
[place] MAIN [facility] SERVICES [address] [place], [place], [address] Phone: [phone]   Fax:  [phone]  Physical Therapy Treatment  Patient Details  Name: [name] [name] MRN: [id number] Date of Birth: Sep 10, 1950 Referring Provider (PT): Dr. Einar Pheasant   Encounter Date: 04/30/2020   PT End of Session - 04/30/20 1016    Visit Number 8    Number of Visits 17    Date for PT Re-Evaluation 05/28/20    Authorization Type eval: 04/02/20    PT Start Time 1010    PT Stop Time 1055    PT Time Calculation (min) 45 min    Equipment Utilized During Treatment Gait belt    Activity Tolerance Patient tolerated treatment well    Behavior During Therapy [facility] for tasks assessed/performed           Past Medical History:  Diagnosis Date  . Allergy   . Anemia    IN THE PAST WITH PREGNANCY  . Arthritis    LEFT FOOT; OCCAS LOWER BACK PAIN - HX OF VERTEBRAL FRACTURES  . Blood transfusion without reported diagnosis    DURING PREGNANCY  . Cataract    removed both eyes   . Complication of anesthesia    a little sluggist when waking up-no intervention required  . Constipation    uses Colace 3 x a day and Dulcolax q week- with this has soft BM's- no pain   . Coronary artery spasm (HCC) 1990'S   AFTER OTC DIET PILLS - PT EXPERIENCED CHEST PAIN- PT HAD CARDIAC CATH - TOLD NO CAD - THOUGHT TO HAVE HAS CORNARY ARTERY SPASM  . GERD (gastroesophageal reflux disease)    mild   . H/O seasonal allergies   . Headache(784.0)    OCCAS SINUS HEADACHES  . Hearing impaired    LEFT EAR DEAFNESS  . Hypertension    220/104 IN SPRING 2015 - SAW HER DOCTOR - BUT B/P NORMALIZED - PT DECIDED TO STOP CAFFEINE AND HAS NOT HAD ANY ELEVATED PRESSURES SINCE JUNE  . Osteopenia   . Strabismus    HAD STRABISMUS SURGERY 1956 - BUT STILL HAS STRABISMUS  . Vitamin D deficiency     Past Surgical History:  Procedure Laterality Date  . ABDOMINAL HYSTERECTOMY    . BONE  ANCHORED HEARING AID IMPLANT Left 01/26/2019   Procedure: BONE ANCHORED HEARING AID (BAHA) IMPLANT;  Surgeon: [name], MD;  Location: [place];  Service: ENT;  Laterality: Left;  . BREATH TEK H PYLORI N/A 10/08/2013   Procedure: BREATH TEK H PYLORI;  Surgeon: Gayland Curry, MD;  Location: [facility];  Service: General;  Laterality: N/A;  . BREATH TEK H PYLORI N/A 12/03/2013   Procedure: BREATH TEK Kandis Ban;  Surgeon: Gayland Curry, MD;  Location: [facility];  Service: General;  Laterality: N/A;  . CHOLECYSTECTOMY N/A 04/13/2016   Procedure: LAPAROSCOPIC CHOLECYSTECTOMY WITH ATTEMPTED INTRAOPERATIVE CHOLANGIOGRAM;  Surgeon: Greer Pickerel, MD;  Location: [place];  Service: General;  Laterality: N/A;  . COLONOSCOPY    . EYE SURGERY Bilateral 2017   cataract surgery with lens implant  . INNER EAR SURGERY Left 1965   RADICAL MASTOIDECTOMY  . LAPAROSCOPIC GASTRIC SLEEVE RESECTION N/A 12/18/2013   Procedure: LAPAROSCOPIC GASTRIC SLEEVE RESECTION;  Surgeon: Greer Pickerel, MD;  Location: [facility];  Service: General;  Laterality: N/A;  . REPAIR TENDONS FOOT Right 1994   AND RECONSTRUCTIVE SURGERY FOR MULTIPLE FRACTURES OF THE RIGHT FOOT  . STOMACH SURGERY    .  strabismus repair    . TONSILLECTOMY    . UPPER GASTROINTESTINAL ENDOSCOPY      There were no vitals filed for this visit.   Subjective Assessment - 04/30/20 1019    Subjective Patient reports that her ankle continues to bother her and she had to put her ankle brace on her R ankle. She was unable to meet her steps goal yesterday due to increased pain. She denies of any soreness or injuries since last therapy session.    Pertinent History 70 y.o. female presents to skilled physical therapy with a referral for balance deficits. She has a past history of an hysterectomy. She has deafness in her left ear which has contributed to her being clumsy for decades. She has had several falls in the past year and reports of a right foot injury. She is having  trouble stopping herself from falling. She also had right leg pain for the past year that is only at night and will wake her up.    Limitations Walking;Lifting;Standing;House hold activities    How long can you sit comfortably? 2-3 hours    How long can you stand comfortably? 15-20 minutes    How long can you walk comfortably? 30 minutes    Patient Stated Goals "I want to get better in my balance skills and get stronger."    Currently in Pain? Yes    Pain Score 3     Pain Location Ankle    Pain Orientation Right           NuStep x L3 x 5 mins   All exercises were completed with a 4# ankle weight Supine SLR hip flexion 2 x 15 BLE; Hooklying bridges with arms across chest 2 x 10; Seated LAQ 2 x 15 BLE; Seated marches x 20 BLE;  Neuro Re-Ed:  Airex beam x tandem walking x 3 laps without BUE support  Airex beam x side stepping walking x 3 laps without BUE support Airex beam x semitandem standing x 3 30s holds alternating legs without BUE support  Step ups and step downs on 4" box x holding small green theraball x 5 reps    Clinical Impression: Patient completed strengthening exercises with fair tolerance to activity. Progressed patient to increase ankle weight to challenge patient to improve muscle recruitment. Patient demonstrates increased muscle fatigue as evidenced by increased rest breaks. Patient continues to have difficulty with tandem position due to decreased ankle stability and decreased proprioception cues. Patient reports that her ankle started to bother her slightly but was able to complete balance exercises. She states she will see how her ankle is feeling after the weekend and if it continues to bother her then she will make an appointment with her podiatrist. Patient will continue to benefit from skilled physical therapy to improve generalized strength, ROM, and capacity for functional activity.        PT Short Term Goals - 04/02/20 1000      PT SHORT TERM GOAL #1    Title Patient will be independent in home exercise program to improve strength/mobility for better functional independence with ADLs.    Time 4    Period Weeks    Status New    Target Date 04/30/20      PT SHORT TERM GOAL #2   Title Patient will deny any falls over past 4 weeks to demonstrate improved safety awareness at home and work.    Time 4    Period Weeks    Status  New    Target Date 04/30/20             PT Long Term Goals - 04/02/20 1000      PT LONG TERM GOAL #1   Title Patient will increase FOTO score to equal to or greater than 64 to demonstrate statistically significant improvement in mobility and quality of life.    Baseline 12/29: 54    Time 8    Period Weeks    Status New    Target Date 05/28/20      PT LONG TERM GOAL #2   Title Patient will increase Berg Balance score by > 6 points to demonstrate decreased fall risk during functional activities.    Baseline 12/29: 40/56    Time 8    Period Weeks    Status New    Target Date 05/28/20      PT LONG TERM GOAL #3   Title Patient will reduce timed up and go to <11 seconds to reduce fall risk and demonstrate improved transfer/gait ability.    Baseline 12/29: 12.28s    Time 8    Period Weeks    Status New    Target Date 05/28/20      PT LONG TERM GOAL #4   Title Patient will increase ABC scale score >80% to demonstrate better functional mobility and better confidence with ADLs    Baseline 12/29: 73.125%    Time 8    Period Weeks    Status New    Target Date 05/28/20      PT LONG TERM GOAL #5   Title Patient will tolerate 5 seconds of single leg stance without loss of balance to improve ability to get in and out of shower safely.    Baseline 12/29: < 3 seconds of SLS    Time 8    Period Weeks    Status New    Target Date 05/28/20                 Plan - 04/30/20 1150    Clinical Impression Statement Patient completed strengthening exercises with fair tolerance to activity. Progressed patient  to increase ankle weight to challenge patient to improve muscle recruitment. Patient demonstrates increased muscle fatigue as evidenced by increased rest breaks. Patient continues to have difficulty with tandem position due to decreased ankle stability and decreased proprioception cues. Patient reports that her ankle started to bother her slightly but was able to complete balance exercises. She states she will see how her ankle is feeling after the weekend and if it continues to bother her then she will make an appointment with her podiatrist. Patient will continue to benefit from skilled physical therapy to improve generalized strength, ROM, and capacity for functional activity.    Personal Factors and Comorbidities Comorbidity 3+    Comorbidities fall risk, osteoporosis, mixed hyperlipidemia, arthralgia, myaglia    Examination-Activity Limitations Reach Overhead;Stairs;Stand;Bend;Lift    Examination-Participation Restrictions Volunteer    Stability/Clinical Decision Making Evolving/Moderate complexity    Rehab Potential Good    PT Frequency 2x / week    PT Duration 8 weeks    PT Treatment/Interventions Gait training;Therapeutic activities;Therapeutic exercise;Balance training;Neuromuscular re-education;Patient/family education;Energy conservation;Aquatic Therapy;ADLs/Self Care Home Management;Electrical Stimulation;Moist Heat;Functional mobility training;Stair training    PT Next Visit Plan Progress dynamic balance exercises/obstacle negotiation    PT Home Exercise Plan HEP: SLR, mini squats, heel raises    Consulted and Agree with Plan of Care Patient  Patient will benefit from skilled therapeutic intervention in order to improve the following deficits and impairments:  Abnormal gait,Decreased balance,Decreased endurance,Decreased mobility,Difficulty walking,Improper body mechanics,Decreased activity tolerance,Decreased safety awareness,Decreased strength  Visit Diagnosis: Muscle  weakness (generalized)  Unsteadiness on feet  Repeated falls  Other abnormalities of gait and mobility     Problem List Patient Active Problem List   Diagnosis Date Noted  . Balance problems 02/18/2020  . Mixed hyperlipidemia 05/28/2019  . Arthralgia 08/30/2018  . Myalgia 08/29/2018  . Vitamin D deficiency 08/29/2018  . Osteoporosis 06/14/2018  . Closed fracture of one rib of left side 04/03/2018  . Atypical lymphocytes present on peripheral blood smear 06/02/2016  . Family history of colon cancer-brother at age 41 05/27/2016  . S/P laparoscopic sleeve gastrectomy 12/18/2013  . Persistent disorder of initiating or maintaining sleep 11/05/2013  . Disorder of bone and cartilage 10/17/2009   Karl Luke PT, DPT Netta Corrigan 04/30/2020, 11:50 AM  [place] MAIN [facility] SERVICES [address] [place], [place], [address] Phone: [phone]   Fax:  [phone]  Name: Twylla Arceneaux MRN: [id number] Date of Birth: Apr 17, 1950

## 2020-05-05 ENCOUNTER — Ambulatory Visit: Payer: Medicare Other | Admitting: Podiatry

## 2020-05-05 ENCOUNTER — Ambulatory Visit: Payer: Medicare PPO

## 2020-05-07 ENCOUNTER — Other Ambulatory Visit: Payer: Self-pay

## 2020-05-07 ENCOUNTER — Ambulatory Visit: Payer: Medicare PPO | Attending: Family Medicine

## 2020-05-07 DIAGNOSIS — R2689 Other abnormalities of gait and mobility: Secondary | ICD-10-CM | POA: Diagnosis not present

## 2020-05-07 DIAGNOSIS — R2681 Unsteadiness on feet: Secondary | ICD-10-CM | POA: Diagnosis not present

## 2020-05-07 DIAGNOSIS — M6281 Muscle weakness (generalized): Secondary | ICD-10-CM | POA: Insufficient documentation

## 2020-05-07 DIAGNOSIS — R296 Repeated falls: Secondary | ICD-10-CM | POA: Insufficient documentation

## 2020-05-07 NOTE — Therapy (Signed)
[place] MAIN [facility] SERVICES [address] [place], [place], [address] Phone: [phone]   Fax:  [phone]  Physical Therapy Treatment  Patient Details  Name: [name] [name] MRN: [id number] Date of Birth: 1951/03/28 Referring Provider (PT): Dr. Einar Pheasant   Encounter Date: 05/07/2020   PT End of Session - 05/07/20 1613    Visit Number 9    Number of Visits 17    Date for PT Re-Evaluation 05/28/20    Authorization Type eval: 04/02/20    PT Start Time 1512    PT Stop Time 1600    PT Time Calculation (min) 48 min    Equipment Utilized During Treatment Gait belt    Activity Tolerance Patient tolerated treatment well;No increased pain    Behavior During Therapy WFL for tasks assessed/performed           Past Medical History:  Diagnosis Date  . Allergy   . Anemia    IN THE PAST WITH PREGNANCY  . Arthritis    LEFT FOOT; OCCAS LOWER BACK PAIN - HX OF VERTEBRAL FRACTURES  . Blood transfusion without reported diagnosis    DURING PREGNANCY  . Cataract    removed both eyes   . Complication of anesthesia    a little sluggist when waking up-no intervention required  . Constipation    uses Colace 3 x a day and Dulcolax q week- with this has soft BM's- no pain   . Coronary artery spasm (HCC) 1990'S   AFTER OTC DIET PILLS - PT EXPERIENCED CHEST PAIN- PT HAD CARDIAC CATH - TOLD NO CAD - THOUGHT TO HAVE HAS CORNARY ARTERY SPASM  . GERD (gastroesophageal reflux disease)    mild   . H/O seasonal allergies   . Headache(784.0)    OCCAS SINUS HEADACHES  . Hearing impaired    LEFT EAR DEAFNESS  . Hypertension    220/104 IN SPRING 2015 - SAW HER DOCTOR - BUT B/P NORMALIZED - PT DECIDED TO STOP CAFFEINE AND HAS NOT HAD ANY ELEVATED PRESSURES SINCE JUNE  . Osteopenia   . Strabismus    HAD STRABISMUS SURGERY 1956 - BUT STILL HAS STRABISMUS  . Vitamin D deficiency     Past Surgical History:  Procedure Laterality Date  . ABDOMINAL HYSTERECTOMY     . BONE ANCHORED HEARING AID IMPLANT Left 01/26/2019   Procedure: BONE ANCHORED HEARING AID (BAHA) IMPLANT;  Surgeon: [name], MD;  Location: [place];  Service: ENT;  Laterality: Left;  . BREATH TEK H PYLORI N/A 10/08/2013   Procedure: BREATH TEK H PYLORI;  Surgeon: Gayland Curry, MD;  Location: [facility];  Service: General;  Laterality: N/A;  . BREATH TEK H PYLORI N/A 12/03/2013   Procedure: BREATH TEK Kandis Ban;  Surgeon: Gayland Curry, MD;  Location: [facility];  Service: General;  Laterality: N/A;  . CHOLECYSTECTOMY N/A 04/13/2016   Procedure: LAPAROSCOPIC CHOLECYSTECTOMY WITH ATTEMPTED INTRAOPERATIVE CHOLANGIOGRAM;  Surgeon: Greer Pickerel, MD;  Location: [place];  Service: General;  Laterality: N/A;  . COLONOSCOPY    . EYE SURGERY Bilateral 2017   cataract surgery with lens implant  . INNER EAR SURGERY Left 1965   RADICAL MASTOIDECTOMY  . LAPAROSCOPIC GASTRIC SLEEVE RESECTION N/A 12/18/2013   Procedure: LAPAROSCOPIC GASTRIC SLEEVE RESECTION;  Surgeon: Greer Pickerel, MD;  Location: [facility];  Service: General;  Laterality: N/A;  . REPAIR TENDONS FOOT Right 1994   AND RECONSTRUCTIVE SURGERY FOR MULTIPLE FRACTURES OF THE RIGHT FOOT  .  STOMACH SURGERY    . strabismus repair    . TONSILLECTOMY    . UPPER GASTROINTESTINAL ENDOSCOPY      There were no vitals filed for this visit.  NuStep x L3 x 5 mins; pt reports level 3 now feels "easy"    Neuro Re-Ed: CGA-min assist provided for the following exercises  Airex beam x side stepping x 6 laps BUE to UUE support  Tandem walking with CGA - x multiple reps up and down // bars* Tandem stance 4x30 sec B LEs, intermittent UE support Narrow stance 2x30 sec intermittent UE support WBOS EC 4x30 sec Cone tap walking in // bars, intermittent UE support - x multiple reps* Backward walking with pivot turns - x multiple reps and intermittent UE support Ambulation with horizontal and vertical head turns 8x each *  *required up to min assist   Plan -  05/07/20 1615    Clinical Impression Statement Pt with multiple instances of LOB during majority of dynamic balance exercises today, seeing greatest decrease in balance with ambulating with vertical and horizontal head turns. When pt would lose balance she would utilize mid-gaurd, and F/B/lateral stepping strategies and require CGA-min assist from PT to regain balance. Pt will benefit from further skilled therapy to improve LE strength, ROM and balance in order to increase safety with all functional mobility.    Personal Factors and Comorbidities Comorbidity 3+    Comorbidities fall risk, osteoporosis, mixed hyperlipidemia, arthralgia, myaglia    Examination-Activity Limitations Reach Overhead;Stairs;Stand;Bend;Lift    Examination-Participation Restrictions Volunteer    Stability/Clinical Decision Making Evolving/Moderate complexity    Rehab Potential Good    PT Frequency 2x / week    PT Duration 8 weeks    PT Treatment/Interventions Gait training;Therapeutic activities;Therapeutic exercise;Balance training;Neuromuscular re-education;Patient/family education;Energy conservation;Aquatic Therapy;ADLs/Self Care Home Management;Electrical Stimulation;Moist Heat;Functional mobility training;Stair training    PT Next Visit Plan Progress dynamic balance exercises/obstacle negotiation/dynamic balance tasks with head turns    PT Home Exercise Plan HEP: SLR, mini squats, heel raises    Consulted and Agree with Plan of Care Patient              PT Education - 05/07/20 1613    Education Details Pt educated on bodymechanics, technique with toe taps    Person(s) Educated Patient    Methods Explanation;Verbal cues;Demonstration    Comprehension Verbalized understanding;Returned demonstration            PT Short Term Goals - 04/02/20 1000      PT SHORT TERM GOAL #1   Title Patient will be independent in home exercise program to improve strength/mobility for better functional independence with ADLs.     Time 4    Period Weeks    Status New    Target Date 04/30/20      PT SHORT TERM GOAL #2   Title Patient will deny any falls over past 4 weeks to demonstrate improved safety awareness at home and work.    Time 4    Period Weeks    Status New    Target Date 04/30/20             PT Long Term Goals - 04/02/20 1000      PT LONG TERM GOAL #1   Title Patient will increase FOTO score to equal to or greater than 64 to demonstrate statistically significant improvement in mobility and quality of life.    Baseline 12/29: 54    Time 8    Period Weeks  Status New    Target Date 05/28/20      PT LONG TERM GOAL #2   Title Patient will increase Berg Balance score by > 6 points to demonstrate decreased fall risk during functional activities.    Baseline 12/29: 40/56    Time 8    Period Weeks    Status New    Target Date 05/28/20      PT LONG TERM GOAL #3   Title Patient will reduce timed up and go to <11 seconds to reduce fall risk and demonstrate improved transfer/gait ability.    Baseline 12/29: 12.28s    Time 8    Period Weeks    Status New    Target Date 05/28/20      PT LONG TERM GOAL #4   Title Patient will increase ABC scale score >80% to demonstrate better functional mobility and better confidence with ADLs    Baseline 12/29: 73.125%    Time 8    Period Weeks    Status New    Target Date 05/28/20      PT LONG TERM GOAL #5   Title Patient will tolerate 5 seconds of single leg stance without loss of balance to improve ability to get in and out of shower safely.    Baseline 12/29: < 3 seconds of SLS    Time 8    Period Weeks    Status New    Target Date 05/28/20            Patient will benefit from skilled therapeutic intervention in order to improve the following deficits and impairments:  Abnormal gait,Decreased balance,Decreased endurance,Decreased mobility,Difficulty walking,Improper body mechanics,Decreased activity tolerance,Decreased safety  awareness,Decreased strength  Visit Diagnosis: Muscle weakness (generalized)  Unsteadiness on feet  Other abnormalities of gait and mobility  Repeated falls     Problem List Patient Active Problem List   Diagnosis Date Noted  . Balance problems 02/18/2020  . Mixed hyperlipidemia 05/28/2019  . Arthralgia 08/30/2018  . Myalgia 08/29/2018  . Vitamin D deficiency 08/29/2018  . Osteoporosis 06/14/2018  . Closed fracture of one rib of left side 04/03/2018  . Atypical lymphocytes present on peripheral blood smear 06/02/2016  . Family history of colon cancer-brother at age 70 05/27/2016  . S/P laparoscopic sleeve gastrectomy 12/18/2013  . Persistent disorder of initiating or maintaining sleep 11/05/2013  . Disorder of bone and cartilage 10/17/2009   Ricard Dillon PT, DPT  05/07/2020, 4:27 PM  [place] MAIN [facility] SERVICES [address] [place], [place], [address] Phone: [phone]   Fax:  [phone]  Name: [name] [name] MRN: [id number] Date of Birth: 23-Jun-1950

## 2020-05-08 DIAGNOSIS — H6123 Impacted cerumen, bilateral: Secondary | ICD-10-CM | POA: Diagnosis not present

## 2020-05-12 ENCOUNTER — Ambulatory Visit: Payer: Medicare PPO

## 2020-05-12 ENCOUNTER — Other Ambulatory Visit: Payer: Self-pay

## 2020-05-12 DIAGNOSIS — M6281 Muscle weakness (generalized): Secondary | ICD-10-CM

## 2020-05-12 DIAGNOSIS — R296 Repeated falls: Secondary | ICD-10-CM | POA: Diagnosis not present

## 2020-05-12 DIAGNOSIS — R2681 Unsteadiness on feet: Secondary | ICD-10-CM

## 2020-05-12 DIAGNOSIS — R2689 Other abnormalities of gait and mobility: Secondary | ICD-10-CM

## 2020-05-12 NOTE — Therapy (Signed)
[place] MAIN [facility] SERVICES [place], [place], [address] Phone: [phone]   Fax:  [phone]  Physical Therapy Treatment/ Physical Therapy Progress Note/RECERT   Dates of reporting period  04/02/2020 to 05/12/2020   Patient Details  Name: Kandee Escalante MRN: [id number] Date of Birth: Aug 22, 1950 Referring Provider (PT): Dr. Einar Pheasant   Encounter Date: 05/12/2020   PT End of Session - 05/12/20 1611    Visit Number 10    Number of Visits 25    Date for PT Re-Evaluation 07/07/20    Authorization Type eval: 04/02/20    PT Start Time 1510    PT Stop Time 1601    PT Time Calculation (min) 51 min    Equipment Utilized During Treatment Gait belt    Activity Tolerance Patient tolerated treatment well;No increased pain    Behavior During Therapy WFL for tasks assessed/performed           Past Medical History:  Diagnosis Date  . Allergy   . Anemia    IN THE PAST WITH PREGNANCY  . Arthritis    LEFT FOOT; OCCAS LOWER BACK PAIN - HX OF VERTEBRAL FRACTURES  . Blood transfusion without reported diagnosis    DURING PREGNANCY  . Cataract    removed both eyes   . Complication of anesthesia    a little sluggist when waking up-no intervention required  . Constipation    uses Colace 3 x a day and Dulcolax q week- with this has soft BM's- no pain   . Coronary artery spasm (HCC) 1990'S   AFTER OTC DIET PILLS - PT EXPERIENCED CHEST PAIN- PT HAD CARDIAC CATH - TOLD NO CAD - THOUGHT TO HAVE HAS CORNARY ARTERY SPASM  . GERD (gastroesophageal reflux disease)    mild   . H/O seasonal allergies   . Headache(784.0)    OCCAS SINUS HEADACHES  . Hearing impaired    LEFT EAR DEAFNESS  . Hypertension    220/104 IN SPRING 2015 - SAW HER DOCTOR - BUT B/P NORMALIZED - PT DECIDED TO STOP CAFFEINE AND HAS NOT HAD ANY ELEVATED PRESSURES SINCE JUNE  . Osteopenia   . Strabismus    HAD STRABISMUS SURGERY 1956 - BUT STILL HAS STRABISMUS  . Vitamin  D deficiency     Past Surgical History:  Procedure Laterality Date  . ABDOMINAL HYSTERECTOMY    . BONE ANCHORED HEARING AID IMPLANT Left 01/26/2019   Procedure: BONE ANCHORED HEARING AID (BAHA) IMPLANT;  Surgeon: [name], MD;  Location: [place];  Service: ENT;  Laterality: Left;  . BREATH TEK H PYLORI N/A 10/08/2013   Procedure: BREATH TEK H PYLORI;  Surgeon: Gayland Curry, MD;  Location: [facility];  Service: General;  Laterality: N/A;  . BREATH TEK H PYLORI N/A 12/03/2013   Procedure: BREATH TEK Kandis Ban;  Surgeon: Gayland Curry, MD;  Location: [facility];  Service: General;  Laterality: N/A;  . CHOLECYSTECTOMY N/A 04/13/2016   Procedure: LAPAROSCOPIC CHOLECYSTECTOMY WITH ATTEMPTED INTRAOPERATIVE CHOLANGIOGRAM;  Surgeon: Greer Pickerel, MD;  Location: [place];  Service: General;  Laterality: N/A;  . COLONOSCOPY    . EYE SURGERY Bilateral 2017   cataract surgery with lens implant  . INNER EAR SURGERY Left 1965   RADICAL MASTOIDECTOMY  . LAPAROSCOPIC GASTRIC SLEEVE RESECTION N/A 12/18/2013   Procedure: LAPAROSCOPIC GASTRIC SLEEVE RESECTION;  Surgeon: Greer Pickerel, MD;  Location: [facility];  Service: General;  Laterality: N/A;  . REPAIR TENDONS FOOT Right  1994   AND RECONSTRUCTIVE SURGERY FOR MULTIPLE FRACTURES OF THE RIGHT FOOT  . STOMACH SURGERY    . strabismus repair    . TONSILLECTOMY    . UPPER GASTROINTESTINAL ENDOSCOPY      There were no vitals filed for this visit.   Subjective Assessment - 05/12/20 1508    Subjective Pt reports "I think things are better" and that her "foot is better." Pt reports no pain today. Pt reports some minor swelling still.    Pertinent History 70 y.o. female presents to skilled physical therapy with a referral for balance deficits. She has a past history of an hysterectomy. She has deafness in her left ear which has contributed to her being clumsy for decades. She has had several falls in the past year and reports of a right foot injury. She is having  trouble stopping herself from falling. She also had right leg pain for the past year that is only at night and will wake her up.    Limitations Walking;Lifting;Standing;House hold activities    How long can you sit comfortably? 2-3 hours    How long can you stand comfortably? 15-20 minutes    How long can you walk comfortably? 30 minutes    Patient Stated Goals "I want to get better in my balance skills and get stronger."    Currently in Pain? No/denies    Pain Onset In the past 7 days              [facility] PT Assessment - 05/12/20 0001      Berg Balance Test   Sit to Stand Able to stand without using hands and stabilize independently    Standing Unsupported Able to stand safely 2 minutes    Sitting with Back Unsupported but Feet Supported on Floor or Stool Able to sit safely and securely 2 minutes    Stand to Sit Sits safely with minimal use of hands    Transfers Able to transfer safely, minor use of hands    Standing Unsupported with Eyes Closed Able to stand 10 seconds safely    Standing Unsupported with Feet Together Able to place feet together independently and stand 1 minute safely    From Standing, Reach Forward with Outstretched Arm Can reach confidently >25 cm (10")    From Standing Position, Pick up Object from Floor Able to pick up shoe safely and easily    From Standing Position, Turn to Look Behind Over each Shoulder Looks behind from both sides and weight shifts well    Turn 360 Degrees Able to turn 360 degrees safely in 4 seconds or less    Standing Unsupported, Alternately Place Feet on Step/Stool Able to complete 4 steps without aid or supervision    Standing Unsupported, One Foot in Front Able to plae foot ahead of the other independently and hold 30 seconds    Standing on One Leg Able to lift leg independently and hold 5-10 seconds    Total Score 52      Timed Up and Go Test   Normal TUG (seconds) 8.2            Treatment: Reassessed goals (see below)  FOTO:  64  Neuromuscular Re-Ed  ABC 76.25%  Berg: 52/56  TUG: 8.2 seconds  DGI 16/24   Ascending/descending steps with carrying bulky object in one UE, no B UE support x multiple reps, CGA and intermittent UUE suport.   Step-to onto 6" block, and step onto and  over 6" block with holding bulky object in one UE, no UE support-intermittent UE support, CGA - x multiple reps.  HEP addition Access Code: [email or web address] URL: [email or web address] Date: 05/12/2020 Prepared by: Temple PaciniHaley Lucerito Rosinski  Exercises Alternating Single Leg Balance - Foot Behind with PLB - 3 x daily - 4 x weekly - 2 sets - 2 reps - 30 hold   Assessment: Pt making progress toward goals today with meeting Berg (53/56), Tug (8.2 sec), SLB (approx. 10 sec B LES) and FOTO (64) goal, indicating improvements in balance. However, pt performed DGI this session, indicating impairment in dynamic balance and indicating pt still a fall risk. Pt reported during session she thinks weakness in her ankle contributing factor to her poor dynamic balance. PT has added DGI goal to track improvement in dynamic balance and has also provided pt with handout of additional exercise for HEP (see note). Pt also still with reduced score on activities-specific balance scale (76.25%). Due to pt progress and high motivation in therapy pt frequency has been reduced to 1x/week. Patient's condition has the potential to improve in response to therapy. Maximum improvement is yet to be obtained. The anticipated improvement is attainable and reasonable in a generally predictable time. Pt will continue to benefit from further skilled therapy to improve balance and LE strength in order to decrease fall risk and improve QOL.        PT Education - 05/12/20 1611    Education Details Pt educated on assessment/goal findings, added exercise to HEP (see note)    Person(s) Educated Patient    Methods Explanation;Demonstration;Verbal cues;Handout    Comprehension  Verbalized understanding;Returned demonstration            PT Short Term Goals - 05/12/20 1513      PT SHORT TERM GOAL #1   Title Patient will be independent in home exercise program to improve strength/mobility for better functional independence with ADLs.    Baseline 05/12/2018: ongoing Pt reports she "moves how I can" at home    Time 4    Period Weeks    Status On-going    Target Date 06/09/20      PT SHORT TERM GOAL #2   Title Patient will deny any falls over past 4 weeks to demonstrate improved safety awareness at home and work.    Baseline 05/12/2020 1 fall where pt slid on some ice    Time 4    Period Weeks    Status On-going    Target Date 06/09/20      Additional Short Term Goals   Additional Short Term Goals --      PT SHORT TERM GOAL #6   Title --    Baseline --    Time --    Period --    Status --    Target Date --             PT Long Term Goals - 05/12/20 1515      PT LONG TERM GOAL #1   Title Patient will increase FOTO score to equal to or greater than 64 to demonstrate statistically significant improvement in mobility and quality of life.    Baseline 12/29: 54; 05/12/2020 FOTO 64    Time 8    Period Weeks    Status Achieved      PT LONG TERM GOAL #2   Title Patient will increase Berg Balance score by > 6 points to demonstrate decreased fall risk during functional activities.    Baseline  12/29: 40/56; 05/12/2020 52/56    Time 8    Period Weeks    Status Achieved      PT LONG TERM GOAL #3   Title Patient will reduce timed up and go to <11 seconds to reduce fall risk and demonstrate improved transfer/gait ability.    Baseline 12/29: 12.28s; 05/12/2020 8.2 sec    Time 8    Period Weeks    Status Achieved      PT LONG TERM GOAL #4   Title Patient will increase ABC scale score >80% to demonstrate better functional mobility and better confidence with ADLs    Baseline 12/29: 73.125%; 05/12/2020 76.25%    Time 8    Period Weeks    Status On-going     Target Date 07/07/20      PT LONG TERM GOAL #5   Title Patient will tolerate 5 seconds of single leg stance without loss of balance to improve ability to get in and out of shower safely.    Baseline 12/29: < 3 seconds of SLS; 05/12/2018 pt held SLB on B LEs for at least 10 sec before requring UE support    Time 8    Period Weeks    Status Achieved      Additional Long Term Goals   Additional Long Term Goals Yes      PT LONG TERM GOAL #6   Title Patient will increase dynamic gait index score to >19/24 as to demonstrate reduced fall risk and improved dynamic gait balance for better safety with community/home ambulation.    Baseline 05/12/2020: 16/24; greatest difficulty ascdnding/descending stairs without rail, gait with horizontal/vertical head turns, stepping over obstacles and sudden change in gait speed.    Time 8    Period Weeks    Status New    Target Date 07/07/20                 Plan - 05/12/20 1638    Clinical Impression Statement Pt making progress toward goals today with meeting Berg (53/56), Tug (8.2 sec), SLB (approx. 10 sec B LES) and FOTO (64) goal, indicating improvements in balance. However, pt performed DGI this session, indicating impairment in dynamic balance and indicating pt still a fall risk. Pt reported during session she thinks weakness in her ankle contributing factor to her poor dynamic balance. PT has added DGI goal to track improvement in dynamic balance and has also provided pt with handout of additional exercise for HEP (see note). Pt also still with reduced score on activities-specific balance scale (76.25%). Due to pt progress and high motivation in therapy pt frequency has been reduced to 1x/week. Patient's condition has the potential to improve in response to therapy. Maximum improvement is yet to be obtained. The anticipated improvement is attainable and reasonable in a generally predictable time. Pt will continue to benefit from further skilled therapy to  improve balance and LE strength in order to decrease fall risk and improve QOL.    Personal Factors and Comorbidities Comorbidity 3+    Comorbidities fall risk, osteoporosis, mixed hyperlipidemia, arthralgia, myaglia    Examination-Activity Limitations Reach Overhead;Stairs;Stand;Bend;Lift    Examination-Participation Restrictions Volunteer    Stability/Clinical Decision Making Evolving/Moderate complexity    Rehab Potential Good    PT Frequency 1x / week    PT Duration 8 weeks    PT Treatment/Interventions Gait training;Therapeutic activities;Therapeutic exercise;Balance training;Neuromuscular re-education;Patient/family education;Energy conservation;Aquatic Therapy;ADLs/Self Care Home Management;Electrical Stimulation;Moist Heat;Functional mobility training;Stair training    PT Next Visit  Plan ascending/descending stairs, practicing getting onto and off of bike, gait with horizontal head turns    PT Home Exercise Plan HEP: SLR, mini squats, heel raises; SLB at counter with chair behind pt    Consulted and Agree with Plan of Care Patient           Patient will benefit from skilled therapeutic intervention in order to improve the following deficits and impairments:  Abnormal gait,Decreased balance,Decreased endurance,Decreased mobility,Difficulty walking,Improper body mechanics,Decreased activity tolerance,Decreased safety awareness,Decreased strength  Visit Diagnosis: Muscle weakness (generalized)  Unsteadiness on feet  Other abnormalities of gait and mobility     Problem List Patient Active Problem List   Diagnosis Date Noted  . Balance problems 02/18/2020  . Mixed hyperlipidemia 05/28/2019  . Arthralgia 08/30/2018  . Myalgia 08/29/2018  . Vitamin D deficiency 08/29/2018  . Osteoporosis 06/14/2018  . Closed fracture of one rib of left side 04/03/2018  . Atypical lymphocytes present on peripheral blood smear 06/02/2016  . Family history of colon cancer-brother at age 59  05/27/2016  . S/P laparoscopic sleeve gastrectomy 12/18/2013  . Persistent disorder of initiating or maintaining sleep 11/05/2013  . Disorder of bone and cartilage 10/17/2009   Ricard Dillon PT, DPT  05/12/2020, 5:31 PM  [place] MAIN [facility] SERVICES [address] [place], [place], [address] Phone: [phone]   Fax:  [phone]  Name: Kadin Sabey MRN: [id number] Date of Birth: Apr 21, 1950

## 2020-05-14 ENCOUNTER — Ambulatory Visit: Payer: Medicare PPO | Admitting: Podiatry

## 2020-05-14 ENCOUNTER — Other Ambulatory Visit: Payer: Self-pay

## 2020-05-14 ENCOUNTER — Encounter: Payer: Self-pay | Admitting: Podiatry

## 2020-05-14 ENCOUNTER — Ambulatory Visit (INDEPENDENT_AMBULATORY_CARE_PROVIDER_SITE_OTHER): Payer: Medicare PPO

## 2020-05-14 ENCOUNTER — Ambulatory Visit: Payer: Medicare PPO

## 2020-05-14 DIAGNOSIS — M722 Plantar fascial fibromatosis: Secondary | ICD-10-CM

## 2020-05-14 DIAGNOSIS — M7751 Other enthesopathy of right foot: Secondary | ICD-10-CM

## 2020-05-14 NOTE — Progress Notes (Signed)
She presents today with a 10-day history of right ankle pain is currently in physical therapy doing balance training it is noticed that the right ankles become swollen and painful.  States that she has had multiple fractures in this area before is concerned that it may be fractured.  States that is slowly are starting to resolve it is starting to do much better but wanted to be seen anyway.  Objective: Vital signs are stable alert oriented x3 there is no erythematous mild edema no cellulitis drainage or odor as we discussed the dorsal dorsal lateral aspect of the right ankle and the anterior lateral fat pad.  She has pain on palpation of the sinus tarsi and pain and end range of motion of the subtalar joint.  However the primary area of tenderness is pain on palpation medial calcaneal tubercle.  Radiographs taken today demonstrate what appears to be an old healing fracture of the distal fibula nondisplaced noncomminuted also soft tissue increase in density plantar fashion calcaneal insertion site and the ankle anterolateral aspect.  The ankle joint itself appears to be normal no spurring no osteoarthritic changes.  Assessment: Planter fasciitis.  Plan: Discussed etiology pathology and surgical therapies at this point time put her in a compression anklet just for the edema but in the end of the day she does not want injection at this time will follow up with me on as-needed basis we did discuss appropriate shoe gear.

## 2020-05-21 ENCOUNTER — Other Ambulatory Visit: Payer: Self-pay

## 2020-05-21 ENCOUNTER — Ambulatory Visit: Payer: Medicare PPO

## 2020-05-21 DIAGNOSIS — R2689 Other abnormalities of gait and mobility: Secondary | ICD-10-CM | POA: Diagnosis not present

## 2020-05-21 DIAGNOSIS — R296 Repeated falls: Secondary | ICD-10-CM | POA: Diagnosis not present

## 2020-05-21 DIAGNOSIS — M6281 Muscle weakness (generalized): Secondary | ICD-10-CM

## 2020-05-21 DIAGNOSIS — R2681 Unsteadiness on feet: Secondary | ICD-10-CM | POA: Diagnosis not present

## 2020-05-21 NOTE — Therapy (Signed)
[place] MAIN [facility] SERVICES [address] [place], [place], [address] Phone: [phone]   Fax:  [phone]  Physical Therapy Treatment  Patient Details  Name: [name] [name] MRN: [id number] Date of Birth: [birth year] Referring Provider (PT): Dr. Einar Pheasant   Encounter Date: 05/21/2020   PT End of Session - 05/21/20 1654    Visit Number 11    Number of Visits 25    Date for PT Re-Evaluation 07/07/20    Authorization Type eval: 04/02/20    PT Start Time 1603    PT Stop Time 1645    PT Time Calculation (min) 42 min    Equipment Utilized During Treatment Gait belt    Activity Tolerance Patient tolerated treatment well;No increased pain    Behavior During Therapy WFL for tasks assessed/performed           Past Medical History:  Diagnosis Date  . Allergy   . Anemia    IN THE PAST WITH PREGNANCY  . Arthritis    LEFT FOOT; OCCAS LOWER BACK PAIN - HX OF VERTEBRAL FRACTURES  . Blood transfusion without reported diagnosis    DURING PREGNANCY  . Cataract    removed both eyes   . Complication of anesthesia    a little sluggist when waking up-no intervention required  . Constipation    uses Colace 3 x a day and Dulcolax q week- with this has soft BM's- no pain   . Coronary artery spasm (HCC) 1990'S   AFTER OTC DIET PILLS - PT EXPERIENCED CHEST PAIN- PT HAD CARDIAC CATH - TOLD NO CAD - THOUGHT TO HAVE HAS CORNARY ARTERY SPASM  . GERD (gastroesophageal reflux disease)    mild   . H/O seasonal allergies   . Headache(784.0)    OCCAS SINUS HEADACHES  . Hearing impaired    LEFT EAR DEAFNESS  . Hypertension    220/104 IN SPRING 2015 - SAW HER DOCTOR - BUT B/P NORMALIZED - PT DECIDED TO STOP CAFFEINE AND HAS NOT HAD ANY ELEVATED PRESSURES SINCE JUNE  . Osteopenia   . Strabismus    HAD STRABISMUS SURGERY 1956 - BUT STILL HAS STRABISMUS  . Vitamin D deficiency     Past Surgical History:  Procedure Laterality Date  . ABDOMINAL  HYSTERECTOMY    . BONE ANCHORED HEARING AID IMPLANT Left 01/26/2019   Procedure: BONE ANCHORED HEARING AID (BAHA) IMPLANT;  Surgeon: [name], MD;  Location: [facility];  Service: ENT;  Laterality: Left;  . BREATH TEK H PYLORI N/A 10/08/2013   Procedure: BREATH TEK H PYLORI;  Surgeon: Gayland Curry, MD;  Location: [facility];  Service: General;  Laterality: N/A;  . BREATH TEK H PYLORI N/A 12/03/2013   Procedure: BREATH TEK Kandis Ban;  Surgeon: Gayland Curry, MD;  Location: [facility];  Service: General;  Laterality: N/A;  . CHOLECYSTECTOMY N/A 04/13/2016   Procedure: LAPAROSCOPIC CHOLECYSTECTOMY WITH ATTEMPTED INTRAOPERATIVE CHOLANGIOGRAM;  Surgeon: Greer Pickerel, MD;  Location: [facility];  Service: General;  Laterality: N/A;  . COLONOSCOPY    . EYE SURGERY Bilateral 2017   cataract surgery with lens implant  . INNER EAR SURGERY Left 1965   RADICAL MASTOIDECTOMY  . LAPAROSCOPIC GASTRIC SLEEVE RESECTION N/A 12/18/2013   Procedure: LAPAROSCOPIC GASTRIC SLEEVE RESECTION;  Surgeon: Greer Pickerel, MD;  Location: [facility];  Service: General;  Laterality: N/A;  . REPAIR TENDONS FOOT Right 1994   AND RECONSTRUCTIVE SURGERY FOR MULTIPLE FRACTURES OF THE RIGHT FOOT  .  STOMACH SURGERY    . strabismus repair    . TONSILLECTOMY    . UPPER GASTROINTESTINAL ENDOSCOPY      There were no vitals filed for this visit.   Subjective Assessment - 05/21/20 1604    Subjective Pt reports she saw her physician and that he thinks she has plantar fasciitis of R foot. Pt reports she is wearing brace today and that it has helped her R foot feel better. Pt reports no swelling. Pt says pain is 1-2/10.    Pertinent History 70 y.o. female presents to skilled physical therapy with a referral for balance deficits. She has a past history of an hysterectomy. She has deafness in her left ear which has contributed to her being clumsy for decades. She has had several falls in the past year and reports of a right foot injury. She is having  trouble stopping herself from falling. She also had right leg pain for the past year that is only at night and will wake her up.    Limitations Walking;Lifting;Standing;House hold activities    How long can you sit comfortably? 2-3 hours    How long can you stand comfortably? 15-20 minutes    How long can you walk comfortably? 30 minutes    Patient Stated Goals "I want to get better in my balance skills and get stronger."    Currently in Pain? Yes    Pain Score 2     Pain Location Foot    Pain Orientation Right    Pain Onset In the past 7 days            Treatment:  Therapeutic Exercise: Foot core propciocepiton/strengthening within pain tolerance range -  x multiple minutes each LE using both thick and thin cloth. VC/TC for technique  YTB dorsiflexion - 3x12 B LEs, pt rates exercise "medium" difficulty.   Standing heel raises at bar - 1x25, 1x 15  Standing hip abduction with 2.5# ankle weights - 3x10 BLEs. VC/TC for technique, pt tends to compensate with lateral lean.   Neuromuscular Re-education: CGA throughout [place] 2x, VC for weight-shifts to meet targets  Ambulation with horizontal head turns through clinic x several minutes. Pt with one instance of LOB, requiring min assist from PT to regain balance. Pt also with noted variability in BOS and reports of dizziness that resolved after exercise.    Assessment: First part of session addressing strengthening of foot, ankle and hip musculature. Second part of session focused on static and dynamic balance. Pt requires frequent VC for weight shifts during Korebalance exercises in order to meet targets, with most frequent cues for posterior shifts. Pt did have instance of LOB with ambulation with horizontal head turns requiring min assist from PT to regain balance. Pt with dizziness with horizontal head turns that resolved quickly once exercise completed. Pt will benefit from further skilled therapy to improve BLE strength and  balance to improve safety with all functional mobility.                    PT Education - 05/21/20 1654    Education Details Pt instructed in foot/ankle strengthening with cloth and theraband    Person(s) Educated Patient    Methods Explanation;Demonstration;Tactile cues;Verbal cues    Comprehension Verbalized understanding;Returned demonstration            PT Short Term Goals - 05/12/20 1513      PT SHORT TERM GOAL #1   Title Patient will be  independent in home exercise program to improve strength/mobility for better functional independence with ADLs.    Baseline 05/12/2018: ongoing Pt reports she "moves how I can" at home    Time 4    Period Weeks    Status On-going    Target Date 06/09/20      PT SHORT TERM GOAL #2   Title Patient will deny any falls over past 4 weeks to demonstrate improved safety awareness at home and work.    Baseline 05/12/2020 1 fall where pt slid on some ice    Time 4    Period Weeks    Status On-going    Target Date 06/09/20      Additional Short Term Goals   Additional Short Term Goals --      PT SHORT TERM GOAL #6   Title --    Baseline --    Time --    Period --    Status --    Target Date --             PT Long Term Goals - 05/12/20 1515      PT LONG TERM GOAL #1   Title Patient will increase FOTO score to equal to or greater than 64 to demonstrate statistically significant improvement in mobility and quality of life.    Baseline 12/29: 54; 05/12/2020 FOTO 64    Time 8    Period Weeks    Status Achieved      PT LONG TERM GOAL #2   Title Patient will increase Berg Balance score by > 6 points to demonstrate decreased fall risk during functional activities.    Baseline 12/29: 40/56; 05/12/2020 52/56    Time 8    Period Weeks    Status Achieved      PT LONG TERM GOAL #3   Title Patient will reduce timed up and go to <11 seconds to reduce fall risk and demonstrate improved transfer/gait ability.    Baseline 12/29:  12.28s; 05/12/2020 8.2 sec    Time 8    Period Weeks    Status Achieved      PT LONG TERM GOAL #4   Title Patient will increase ABC scale score >80% to demonstrate better functional mobility and better confidence with ADLs    Baseline 12/29: 73.125%; 05/12/2020 76.25%    Time 8    Period Weeks    Status On-going    Target Date 07/07/20      PT LONG TERM GOAL #5   Title Patient will tolerate 5 seconds of single leg stance without loss of balance to improve ability to get in and out of shower safely.    Baseline 12/29: < 3 seconds of SLS; 05/12/2018 pt held SLB on B LEs for at least 10 sec before requring UE support    Time 8    Period Weeks    Status Achieved      Additional Long Term Goals   Additional Long Term Goals Yes      PT LONG TERM GOAL #6   Title Patient will increase dynamic gait index score to >19/24 as to demonstrate reduced fall risk and improved dynamic gait balance for better safety with community/home ambulation.    Baseline 05/12/2020: 16/24; greatest difficulty ascdnding/descending stairs without rail, gait with horizontal/vertical head turns, stepping over obstacles and sudden change in gait speed.    Time 8    Period Weeks    Status New    Target Date 07/07/20  Plan - 05/21/20 1655    Clinical Impression Statement First part of session addressing strengthening of foot, ankle and hip musculature. Second part of session focused on static and dynamic balance. Pt requires frequent VC for weight shifts during Korebalance exercises in order to meet targets, with most frequent cues for posterior shifts. Pt did have instance of LOB with ambulation with horizontal head turns requiring min assist from PT to regain balance. Pt with dizziness with horizontal head turns that resolved quickly once exercise completed. Pt will benefit from further skilled therapy to improve BLE strength and balance to improve safety with all functional mobility.    Personal  Factors and Comorbidities Comorbidity 3+    Comorbidities fall risk, osteoporosis, mixed hyperlipidemia, arthralgia, myaglia    Examination-Activity Limitations Reach Overhead;Stairs;Stand;Bend;Lift    Examination-Participation Restrictions Volunteer    Stability/Clinical Decision Making Evolving/Moderate complexity    Rehab Potential Good    PT Frequency 1x / week    PT Duration 8 weeks    PT Treatment/Interventions Gait training;Therapeutic activities;Therapeutic exercise;Balance training;Neuromuscular re-education;Patient/family education;Energy conservation;Aquatic Therapy;ADLs/Self Care Home Management;Electrical Stimulation;Moist Heat;Functional mobility training;Stair training    PT Next Visit Plan ascending/descending stairs, practicing getting onto and off of bike, gait with horizontal head turns, weight-shifts    PT Home Exercise Plan HEP: SLR, mini squats, heel raises; SLB at counter with chair behind pt    Consulted and Agree with Plan of Care Patient           Patient will benefit from skilled therapeutic intervention in order to improve the following deficits and impairments:  Abnormal gait,Decreased balance,Decreased endurance,Decreased mobility,Difficulty walking,Improper body mechanics,Decreased activity tolerance,Decreased safety awareness,Decreased strength  Visit Diagnosis: Unsteadiness on feet  Muscle weakness (generalized)  Other abnormalities of gait and mobility     Problem List Patient Active Problem List   Diagnosis Date Noted  . Balance problems 02/18/2020  . Mixed hyperlipidemia 05/28/2019  . Left ear pain 02/15/2019  . Arthralgia 08/30/2018  . Myalgia 08/29/2018  . Vitamin D deficiency 08/29/2018  . Osteoporosis 06/14/2018  . Closed fracture of one rib of left side 04/03/2018  . Atypical lymphocytes present on peripheral blood smear 06/02/2016  . Family history of colon cancer-brother at age 56 05/27/2016  . Conductive hearing loss, bilateral  05/04/2016  . S/P laparoscopic sleeve gastrectomy 12/18/2013  . Persistent disorder of initiating or maintaining sleep 11/05/2013  . Disorder of bone and cartilage 10/17/2009   Ricard Dillon PT, DPT 05/21/2020, 5:07 PM  [place] MAIN [facility] SERVICES [address] [place], [place], [address] Phone: [phone]   Fax:  [phone]  Name: [name] [name] MRN: [id number] Date of Birth: [birth year]

## 2020-05-26 ENCOUNTER — Ambulatory Visit: Payer: Medicare PPO

## 2020-05-28 ENCOUNTER — Other Ambulatory Visit: Payer: Self-pay

## 2020-05-28 ENCOUNTER — Ambulatory Visit: Payer: Medicare PPO

## 2020-05-28 DIAGNOSIS — M6281 Muscle weakness (generalized): Secondary | ICD-10-CM | POA: Diagnosis not present

## 2020-05-28 DIAGNOSIS — R2689 Other abnormalities of gait and mobility: Secondary | ICD-10-CM

## 2020-05-28 DIAGNOSIS — R2681 Unsteadiness on feet: Secondary | ICD-10-CM | POA: Diagnosis not present

## 2020-05-28 DIAGNOSIS — R296 Repeated falls: Secondary | ICD-10-CM | POA: Diagnosis not present

## 2020-05-28 NOTE — Therapy (Signed)
[place] MAIN [facility] SERVICES [address] [place], [place], [address] Phone: [phone]   Fax:  [phone]  Physical Therapy Treatment  Patient Details  Name: [name] [name] MRN: [id number] Date of Birth: 01-02-1951 Referring Provider (PT): Dr. Einar Pheasant   Encounter Date: 05/28/2020   PT End of Session - 05/28/20 1657    Visit Number 12    Number of Visits 25    Date for PT Re-Evaluation 07/07/20    Authorization Type eval: 04/02/20    PT Start Time 1507    PT Stop Time 1555    PT Time Calculation (min) 48 min    Equipment Utilized During Treatment Gait belt    Activity Tolerance Patient tolerated treatment well;No increased pain    Behavior During Therapy WFL for tasks assessed/performed           Past Medical History:  Diagnosis Date  . Allergy   . Anemia    IN THE PAST WITH PREGNANCY  . Arthritis    LEFT FOOT; OCCAS LOWER BACK PAIN - HX OF VERTEBRAL FRACTURES  . Blood transfusion without reported diagnosis    DURING PREGNANCY  . Cataract    removed both eyes   . Complication of anesthesia    a little sluggist when waking up-no intervention required  . Constipation    uses Colace 3 x a day and Dulcolax q week- with this has soft BM's- no pain   . Coronary artery spasm (HCC) 1990'S   AFTER OTC DIET PILLS - PT EXPERIENCED CHEST PAIN- PT HAD CARDIAC CATH - TOLD NO CAD - THOUGHT TO HAVE HAS CORNARY ARTERY SPASM  . GERD (gastroesophageal reflux disease)    mild   . H/O seasonal allergies   . Headache(784.0)    OCCAS SINUS HEADACHES  . Hearing impaired    LEFT EAR DEAFNESS  . Hypertension    220/104 IN SPRING 2015 - SAW HER DOCTOR - BUT B/P NORMALIZED - PT DECIDED TO STOP CAFFEINE AND HAS NOT HAD ANY ELEVATED PRESSURES SINCE JUNE  . Osteopenia   . Strabismus    HAD STRABISMUS SURGERY 1956 - BUT STILL HAS STRABISMUS  . Vitamin D deficiency     Past Surgical History:  Procedure Laterality Date  . ABDOMINAL  HYSTERECTOMY    . BONE ANCHORED HEARING AID IMPLANT Left 01/26/2019   Procedure: BONE ANCHORED HEARING AID (BAHA) IMPLANT;  Surgeon: [name], MD;  Location: [place];  Service: ENT;  Laterality: Left;  . BREATH TEK H PYLORI N/A 10/08/2013   Procedure: BREATH TEK H PYLORI;  Surgeon: Gayland Curry, MD;  Location: [facility];  Service: General;  Laterality: N/A;  . BREATH TEK H PYLORI N/A 12/03/2013   Procedure: BREATH TEK Kandis Ban;  Surgeon: Gayland Curry, MD;  Location: [facility];  Service: General;  Laterality: N/A;  . CHOLECYSTECTOMY N/A 04/13/2016   Procedure: LAPAROSCOPIC CHOLECYSTECTOMY WITH ATTEMPTED INTRAOPERATIVE CHOLANGIOGRAM;  Surgeon: Greer Pickerel, MD;  Location: [place];  Service: General;  Laterality: N/A;  . COLONOSCOPY    . EYE SURGERY Bilateral 2017   cataract surgery with lens implant  . INNER EAR SURGERY Left 1965   RADICAL MASTOIDECTOMY  . LAPAROSCOPIC GASTRIC SLEEVE RESECTION N/A 12/18/2013   Procedure: LAPAROSCOPIC GASTRIC SLEEVE RESECTION;  Surgeon: Greer Pickerel, MD;  Location: [facility];  Service: General;  Laterality: N/A;  . REPAIR TENDONS FOOT Right 1994   AND RECONSTRUCTIVE SURGERY FOR MULTIPLE FRACTURES OF THE RIGHT FOOT  .  STOMACH SURGERY    . strabismus repair    . TONSILLECTOMY    . UPPER GASTROINTESTINAL ENDOSCOPY      There were no vitals filed for this visit.   Subjective Assessment - 05/28/20 1506    Subjective Pt reports she had near-fall about two days ago but that she was able to stop it with a step and feels she has improved.    Pertinent History 70 y.o. female presents to skilled physical therapy with a referral for balance deficits. She has a past history of an hysterectomy. She has deafness in her left ear which has contributed to her being clumsy for decades. She has had several falls in the past year and reports of a right foot injury. She is having trouble stopping herself from falling. She also had right leg pain for the past year that is only at  night and will wake her up.    Limitations Walking;Lifting;Standing;House hold activities    How long can you sit comfortably? 2-3 hours    How long can you stand comfortably? 15-20 minutes    How long can you walk comfortably? 30 minutes    Patient Stated Goals "I want to get better in my balance skills and get stronger."    Currently in Pain? No/denies    Pain Onset In the past 7 days          Treatment:  Neuromuscular Re-education: CGA throughout all exercises  Kore balance - 3x peungin race; improved meeting targets with reps  Ambulating incline/decline 4x each, with horizonal head turns 4x each, vertical head turns 4x each; greater variability seen with incline where pt had increased variability in BOS and required step strategy.   Reactive postural control/step strategies exercises in // bars- Posterior direction - x15; Forward x10; pt demonstrates good reactive postural control;  Lateral step strategy (R and L) - x10 each; absent stepping to R side. Pt attempts crossover step with L foot but difficulty picking up RLE.  Lateral toe-taps on cones - 2x10 each side. Difficulty with controlled movement B  Assessment: Practiced stepping strategies this session. Pt had greatest difficulty with posterior step and lateral step to R side, due to difficulty using LLE as weight-bearing leg. Pt able to take some small to large steps posteriorly, but had absent strategy to R. Pt will benefit from further skilled therapy to improve BLE strength and balance to decrease fall risk.    PT Education - 05/28/20 1656    Education Details stepping-strategy, body mechanics    Person(s) Educated Patient    Methods Explanation;Demonstration;Verbal cues    Comprehension Verbalized understanding;Returned demonstration            PT Short Term Goals - 05/12/20 1513      PT SHORT TERM GOAL #1   Title Patient will be independent in home exercise program to improve strength/mobility for better  functional independence with ADLs.    Baseline 05/12/2018: ongoing Pt reports she "moves how I can" at home    Time 4    Period Weeks    Status On-going    Target Date 06/09/20      PT SHORT TERM GOAL #2   Title Patient will deny any falls over past 4 weeks to demonstrate improved safety awareness at home and work.    Baseline 05/12/2020 1 fall where pt slid on some ice    Time 4    Period Weeks    Status On-going    Target  Date 06/09/20      Additional Short Term Goals   Additional Short Term Goals --      PT SHORT TERM GOAL #6   Title --    Baseline --    Time --    Period --    Status --    Target Date --             PT Long Term Goals - 05/12/20 1515      PT LONG TERM GOAL #1   Title Patient will increase FOTO score to equal to or greater than 64 to demonstrate statistically significant improvement in mobility and quality of life.    Baseline 12/29: 54; 05/12/2020 FOTO 64    Time 8    Period Weeks    Status Achieved      PT LONG TERM GOAL #2   Title Patient will increase Berg Balance score by > 6 points to demonstrate decreased fall risk during functional activities.    Baseline 12/29: 40/56; 05/12/2020 52/56    Time 8    Period Weeks    Status Achieved      PT LONG TERM GOAL #3   Title Patient will reduce timed up and go to <11 seconds to reduce fall risk and demonstrate improved transfer/gait ability.    Baseline 12/29: 12.28s; 05/12/2020 8.2 sec    Time 8    Period Weeks    Status Achieved      PT LONG TERM GOAL #4   Title Patient will increase ABC scale score >80% to demonstrate better functional mobility and better confidence with ADLs    Baseline 12/29: 73.125%; 05/12/2020 76.25%    Time 8    Period Weeks    Status On-going    Target Date 07/07/20      PT LONG TERM GOAL #5   Title Patient will tolerate 5 seconds of single leg stance without loss of balance to improve ability to get in and out of shower safely.    Baseline 12/29: < 3 seconds of SLS;  05/12/2018 pt held SLB on B LEs for at least 10 sec before requring UE support    Time 8    Period Weeks    Status Achieved      Additional Long Term Goals   Additional Long Term Goals Yes      PT LONG TERM GOAL #6   Title Patient will increase dynamic gait index score to >19/24 as to demonstrate reduced fall risk and improved dynamic gait balance for better safety with community/home ambulation.    Baseline 05/12/2020: 16/24; greatest difficulty ascdnding/descending stairs without rail, gait with horizontal/vertical head turns, stepping over obstacles and sudden change in gait speed.    Time 8    Period Weeks    Status New    Target Date 07/07/20                 Plan - 05/28/20 1705    Clinical Impression Statement Practiced stepping strategies this session. Pt had greatest difficulty with posterior step and lateral step to R side, due to difficulty using LLE as weight-bearing leg. Pt able to take some small to large steps posteriorly, but had absent strategy to R. Pt will benefit from further skilled therapy to improve BLE strength and balance to decrease fall risk.    Personal Factors and Comorbidities Comorbidity 3+    Comorbidities fall risk, osteoporosis, mixed hyperlipidemia, arthralgia, myaglia    Examination-Activity Limitations Reach Overhead;Stairs;Stand;Bend;Lift  Examination-Participation Restrictions Volunteer    Stability/Clinical Decision Making Evolving/Moderate complexity    Rehab Potential Good    PT Frequency 1x / week    PT Duration 8 weeks    PT Treatment/Interventions Gait training;Therapeutic activities;Therapeutic exercise;Balance training;Neuromuscular re-education;Patient/family education;Energy conservation;Aquatic Therapy;ADLs/Self Care Home Management;Electrical Stimulation;Moist Heat;Functional mobility training;Stair training    PT Next Visit Plan ascending/descending stairs, practicing getting onto and off of bike, gait with horizontal head turns,  weight-shifts; step-strategies    PT Home Exercise Plan HEP: SLR, mini squats, heel raises; SLB at counter with chair behind pt    Consulted and Agree with Plan of Care Patient           Patient will benefit from skilled therapeutic intervention in order to improve the following deficits and impairments:  Abnormal gait,Decreased balance,Decreased endurance,Decreased mobility,Difficulty walking,Improper body mechanics,Decreased activity tolerance,Decreased safety awareness,Decreased strength  Visit Diagnosis: Unsteadiness on feet  Muscle weakness (generalized)  Other abnormalities of gait and mobility     Problem List Patient Active Problem List   Diagnosis Date Noted  . Balance problems 02/18/2020  . Mixed hyperlipidemia 05/28/2019  . Left ear pain 02/15/2019  . Arthralgia 08/30/2018  . Myalgia 08/29/2018  . Vitamin D deficiency 08/29/2018  . Osteoporosis 06/14/2018  . Closed fracture of one rib of left side 04/03/2018  . Atypical lymphocytes present on peripheral blood smear 06/02/2016  . Family history of colon cancer-brother at age 36 05/27/2016  . Conductive hearing loss, bilateral 05/04/2016  . S/P laparoscopic sleeve gastrectomy 12/18/2013  . Persistent disorder of initiating or maintaining sleep 11/05/2013  . Disorder of bone and cartilage 10/17/2009   Ricard Dillon PT, DPT 05/28/2020, 5:07 PM  [place] MAIN [facility] SERVICES [address] [place], [place], [address] Phone: [phone]   Fax:  [phone]  Name: Abbye Lao MRN: [id number] Date of Birth: December 23, 1950

## 2020-06-02 ENCOUNTER — Ambulatory Visit: Payer: Medicare PPO

## 2020-06-04 ENCOUNTER — Other Ambulatory Visit: Payer: Self-pay

## 2020-06-04 ENCOUNTER — Ambulatory Visit: Payer: Medicare PPO | Attending: Family Medicine

## 2020-06-04 DIAGNOSIS — R2681 Unsteadiness on feet: Secondary | ICD-10-CM

## 2020-06-04 DIAGNOSIS — M6281 Muscle weakness (generalized): Secondary | ICD-10-CM

## 2020-06-04 DIAGNOSIS — R2689 Other abnormalities of gait and mobility: Secondary | ICD-10-CM

## 2020-06-04 DIAGNOSIS — R296 Repeated falls: Secondary | ICD-10-CM

## 2020-06-04 NOTE — Therapy (Signed)
[place] MAIN [facility] SERVICES [address] [place], [place], [address] Phone: [phone]   Fax:  [phone]  Physical Therapy Treatment  Patient Details  Name: [name] [name] MRN: [id number] Date of Birth: [birth year] Referring Provider (PT): Dr. Einar Pheasant   Encounter Date: 06/04/2020   PT End of Session - 06/04/20 1649    Visit Number 13    Number of Visits 25    Date for PT Re-Evaluation 07/07/20    Authorization Type eval: 04/02/20    PT Start Time 0448    PT Stop Time 0529    PT Time Calculation (min) 41 min    Equipment Utilized During Treatment Gait belt    Activity Tolerance Patient tolerated treatment well;No increased pain    Behavior During Therapy WFL for tasks assessed/performed           Past Medical History:  Diagnosis Date  . Allergy   . Anemia    IN THE PAST WITH PREGNANCY  . Arthritis    LEFT FOOT; OCCAS LOWER BACK PAIN - HX OF VERTEBRAL FRACTURES  . Blood transfusion without reported diagnosis    DURING PREGNANCY  . Cataract    removed both eyes   . Complication of anesthesia    a little sluggist when waking up-no intervention required  . Constipation    uses Colace 3 x a day and Dulcolax q week- with this has soft BM's- no pain   . Coronary artery spasm (HCC) 1990'S   AFTER OTC DIET PILLS - PT EXPERIENCED CHEST PAIN- PT HAD CARDIAC CATH - TOLD NO CAD - THOUGHT TO HAVE HAS CORNARY ARTERY SPASM  . GERD (gastroesophageal reflux disease)    mild   . H/O seasonal allergies   . Headache(784.0)    OCCAS SINUS HEADACHES  . Hearing impaired    LEFT EAR DEAFNESS  . Hypertension    220/104 IN SPRING 2015 - SAW HER DOCTOR - BUT B/P NORMALIZED - PT DECIDED TO STOP CAFFEINE AND HAS NOT HAD ANY ELEVATED PRESSURES SINCE JUNE  . Osteopenia   . Strabismus    HAD STRABISMUS SURGERY 1956 - BUT STILL HAS STRABISMUS  . Vitamin D deficiency     Past Surgical History:  Procedure Laterality Date  . ABDOMINAL HYSTERECTOMY     . BONE ANCHORED HEARING AID IMPLANT Left 01/26/2019   Procedure: BONE ANCHORED HEARING AID (BAHA) IMPLANT;  Surgeon: [name], MD;  Location: [place];  Service: ENT;  Laterality: Left;  . BREATH TEK H PYLORI N/A 10/08/2013   Procedure: BREATH TEK H PYLORI;  Surgeon: Gayland Curry, MD;  Location: [facility];  Service: General;  Laterality: N/A;  . BREATH TEK H PYLORI N/A 12/03/2013   Procedure: BREATH TEK Kandis Ban;  Surgeon: Gayland Curry, MD;  Location: [facility];  Service: General;  Laterality: N/A;  . CHOLECYSTECTOMY N/A 04/13/2016   Procedure: LAPAROSCOPIC CHOLECYSTECTOMY WITH ATTEMPTED INTRAOPERATIVE CHOLANGIOGRAM;  Surgeon: Greer Pickerel, MD;  Location: [place];  Service: General;  Laterality: N/A;  . COLONOSCOPY    . EYE SURGERY Bilateral 2017   cataract surgery with lens implant  . INNER EAR SURGERY Left 1965   RADICAL MASTOIDECTOMY  . LAPAROSCOPIC GASTRIC SLEEVE RESECTION N/A 12/18/2013   Procedure: LAPAROSCOPIC GASTRIC SLEEVE RESECTION;  Surgeon: Greer Pickerel, MD;  Location: [facility];  Service: General;  Laterality: N/A;  . REPAIR TENDONS FOOT Right 1994   AND RECONSTRUCTIVE SURGERY FOR MULTIPLE FRACTURES OF THE RIGHT FOOT  .  STOMACH SURGERY    . strabismus repair    . TONSILLECTOMY    . UPPER GASTROINTESTINAL ENDOSCOPY      There were no vitals filed for this visit.   Subjective Assessment - 06/04/20 1651    Subjective The patient reports that therapy has been well worth her efforts and that she is getting better.    Pertinent History 70 y.o. female presents to skilled physical therapy with a referral for balance deficits. She has a past history of an hysterectomy. She has deafness in her left ear which has contributed to her being clumsy for decades. She has had several falls in the past year and reports of a right foot injury. She is having trouble stopping herself from falling. She also had right leg pain for the past year that is only at night and will wake her up.     Limitations Walking;Lifting;Standing;House hold activities    How long can you sit comfortably? 2-3 hours    How long can you stand comfortably? 15-20 minutes    How long can you walk comfortably? 30 minutes    Patient Stated Goals "I want to get better in my balance skills and get stronger."    Currently in Pain? No/denies    Pain Score 0-No pain    Pain Onset In the past 7 days           Treatment:   Neuromuscular Re-education: CGA throughout all exercises   Kore balance - 3x penguin race hit all targets last 2 trials at slower speed  Lateral toe-taps on cones - 2x10 each side. Difficulty with controlled movement B, need for UE support to maintain balance x3   Ambulating with ball passing to side Ambulating with head nods and turns multiple trials 1 LOB with patient able to step laterally with right LE to maintain balance  Walking in and out of cones x multiple laps 1 LOB patient able to take a small step to the right to maintain balance  Carrying crate with head turns and nods.  Patient with some veering to right with right head turns   Airex normal stance with balloon hitting at different angles and degree of reaching and weight shifting xmultiple reps                         PT Short Term Goals - 05/12/20 1513      PT SHORT TERM GOAL #1   Title Patient will be independent in home exercise program to improve strength/mobility for better functional independence with ADLs.    Baseline 05/12/2018: ongoing Pt reports she "moves how I can" at home    Time 4    Period Weeks    Status On-going    Target Date 06/09/20      PT SHORT TERM GOAL #2   Title Patient will deny any falls over past 4 weeks to demonstrate improved safety awareness at home and work.    Baseline 05/12/2020 1 fall where pt slid on some ice    Time 4    Period Weeks    Status On-going    Target Date 06/09/20      Additional Short Term Goals   Additional Short Term Goals --      PT  SHORT TERM GOAL #6   Title --    Baseline --    Time --    Period --    Status --    Target  Date --             PT Long Term Goals - 05/12/20 1515      PT LONG TERM GOAL #1   Title Patient will increase FOTO score to equal to or greater than 64 to demonstrate statistically significant improvement in mobility and quality of life.    Baseline 12/29: 54; 05/12/2020 FOTO 64    Time 8    Period Weeks    Status Achieved      PT LONG TERM GOAL #2   Title Patient will increase Berg Balance score by > 6 points to demonstrate decreased fall risk during functional activities.    Baseline 12/29: 40/56; 05/12/2020 52/56    Time 8    Period Weeks    Status Achieved      PT LONG TERM GOAL #3   Title Patient will reduce timed up and go to <11 seconds to reduce fall risk and demonstrate improved transfer/gait ability.    Baseline 12/29: 12.28s; 05/12/2020 8.2 sec    Time 8    Period Weeks    Status Achieved      PT LONG TERM GOAL #4   Title Patient will increase ABC scale score >80% to demonstrate better functional mobility and better confidence with ADLs    Baseline 12/29: 73.125%; 05/12/2020 76.25%    Time 8    Period Weeks    Status On-going    Target Date 07/07/20      PT LONG TERM GOAL #5   Title Patient will tolerate 5 seconds of single leg stance without loss of balance to improve ability to get in and out of shower safely.    Baseline 12/29: < 3 seconds of SLS; 05/12/2018 pt held SLB on B LEs for at least 10 sec before requring UE support    Time 8    Period Weeks    Status Achieved      Additional Long Term Goals   Additional Long Term Goals Yes      PT LONG TERM GOAL #6   Title Patient will increase dynamic gait index score to >19/24 as to demonstrate reduced fall risk and improved dynamic gait balance for better safety with community/home ambulation.    Baseline 05/12/2020: 16/24; greatest difficulty ascdnding/descending stairs without rail, gait with horizontal/vertical head  turns, stepping over obstacles and sudden change in gait speed.    Time 8    Period Weeks    Status New    Target Date 07/07/20                 Plan - 06/04/20 1729    Clinical Impression Statement Patient with mild LOB x2 to right and was able to take step to maintain balance.  The patient did demonstrate some veering to the right with head turns to the right  while carrying crate and was able to maintain walking in a straight line with head turns to the left and looking up and down.  Patient continues to demonstrate progress and benefits from continued skilled PT services to improve balance for improved QOL.    Personal Factors and Comorbidities Comorbidity 3+    Comorbidities fall risk, osteoporosis, mixed hyperlipidemia, arthralgia, myaglia    Examination-Activity Limitations Reach Overhead;Stairs;Stand;Bend;Lift    Examination-Participation Restrictions Volunteer    Stability/Clinical Decision Making Evolving/Moderate complexity    Rehab Potential Good    PT Frequency 1x / week    PT Duration 8 weeks    PT Treatment/Interventions  Gait training;Therapeutic activities;Therapeutic exercise;Balance training;Neuromuscular re-education;Patient/family education;Energy conservation;Aquatic Therapy;ADLs/Self Care Home Management;Electrical Stimulation;Moist Heat;Functional mobility training;Stair training    PT Next Visit Plan ascending/descending stairs, practicing getting onto and off of bike, gait with horizontal head turns, weight-shifts; step-strategies    PT Home Exercise Plan HEP: SLR, mini squats, heel raises; SLB at counter with chair behind pt    Consulted and Agree with Plan of Care Patient           Patient will benefit from skilled therapeutic intervention in order to improve the following deficits and impairments:  Abnormal gait,Decreased balance,Decreased endurance,Decreased mobility,Difficulty walking,Improper body mechanics,Decreased activity tolerance,Decreased safety  awareness,Decreased strength  Visit Diagnosis: Unsteadiness on feet  Muscle weakness (generalized)  Other abnormalities of gait and mobility  Repeated falls     Problem List Patient Active Problem List   Diagnosis Date Noted  . Balance problems 02/18/2020  . Mixed hyperlipidemia 05/28/2019  . Left ear pain 02/15/2019  . Arthralgia 08/30/2018  . Myalgia 08/29/2018  . Vitamin D deficiency 08/29/2018  . Osteoporosis 06/14/2018  . Closed fracture of one rib of left side 04/03/2018  . Atypical lymphocytes present on peripheral blood smear 06/02/2016  . Family history of colon cancer-brother at age 83 05/27/2016  . Conductive hearing loss, bilateral 05/04/2016  . S/P laparoscopic sleeve gastrectomy 12/18/2013  . Persistent disorder of initiating or maintaining sleep 11/05/2013  . Disorder of bone and cartilage 10/17/2009    Hal Morales PT, DPT 06/04/2020, 5:32 PM  [place] MAIN [facility] SERVICES [address] [place], [place], [address] Phone: [phone]   Fax:  [phone]  Name: [name] [name] MRN: [id number] Date of Birth: 1950-05-04

## 2020-06-13 ENCOUNTER — Ambulatory Visit: Payer: Medicare PPO

## 2020-06-19 ENCOUNTER — Ambulatory Visit: Payer: Medicare PPO

## 2020-06-23 ENCOUNTER — Ambulatory Visit: Payer: Medicare PPO

## 2020-06-25 ENCOUNTER — Ambulatory Visit: Payer: Medicare PPO

## 2020-06-30 ENCOUNTER — Ambulatory Visit: Payer: Medicare PPO

## 2020-06-30 ENCOUNTER — Other Ambulatory Visit: Payer: Self-pay

## 2020-06-30 DIAGNOSIS — R2689 Other abnormalities of gait and mobility: Secondary | ICD-10-CM | POA: Diagnosis not present

## 2020-06-30 DIAGNOSIS — R296 Repeated falls: Secondary | ICD-10-CM | POA: Diagnosis not present

## 2020-06-30 DIAGNOSIS — R2681 Unsteadiness on feet: Secondary | ICD-10-CM | POA: Diagnosis not present

## 2020-06-30 DIAGNOSIS — M6281 Muscle weakness (generalized): Secondary | ICD-10-CM | POA: Diagnosis not present

## 2020-06-30 NOTE — Therapy (Signed)
[place] MAIN [facility] SERVICES [address] [place], [place], [address] Phone: [phone]   Fax:  [phone]  Physical Therapy Treatment/DISCHARGE SUMMARY  Patient Details  Name: Birtha Hatler MRN: [id number] Date of Birth: 30-Jan-1951 Referring Provider (PT): Dr. Einar Pheasant   Encounter Date: 06/30/2020   PT End of Session - 06/30/20 1126    Visit Number 14    Number of Visits 25    Date for PT Re-Evaluation 07/07/20    Authorization Type eval: 04/02/20    PT Start Time 1018    PT Stop Time 1050    PT Time Calculation (min) 32 min    Equipment Utilized During Treatment Gait belt    Activity Tolerance Patient tolerated treatment well    Behavior During Therapy [facility] for tasks assessed/performed           Past Medical History:  Diagnosis Date  . Allergy   . Anemia    IN THE PAST WITH PREGNANCY  . Arthritis    LEFT FOOT; OCCAS LOWER BACK PAIN - HX OF VERTEBRAL FRACTURES  . Blood transfusion without reported diagnosis    DURING PREGNANCY  . Cataract    removed both eyes   . Complication of anesthesia    a little sluggist when waking up-no intervention required  . Constipation    uses Colace 3 x a day and Dulcolax q week- with this has soft BM's- no pain   . Coronary artery spasm (HCC) 1990'S   AFTER OTC DIET PILLS - PT EXPERIENCED CHEST PAIN- PT HAD CARDIAC CATH - TOLD NO CAD - THOUGHT TO HAVE HAS CORNARY ARTERY SPASM  . GERD (gastroesophageal reflux disease)    mild   . H/O seasonal allergies   . Headache(784.0)    OCCAS SINUS HEADACHES  . Hearing impaired    LEFT EAR DEAFNESS  . Hypertension    220/104 IN SPRING 2015 - SAW HER DOCTOR - BUT B/P NORMALIZED - PT DECIDED TO STOP CAFFEINE AND HAS NOT HAD ANY ELEVATED PRESSURES SINCE JUNE  . Osteopenia   . Strabismus    HAD STRABISMUS SURGERY 1956 - BUT STILL HAS STRABISMUS  . Vitamin D deficiency     Past Surgical History:  Procedure Laterality Date  . ABDOMINAL  HYSTERECTOMY    . BONE ANCHORED HEARING AID IMPLANT Left 01/26/2019   Procedure: BONE ANCHORED HEARING AID (BAHA) IMPLANT;  Surgeon: [name], MD;  Location: [facility];  Service: ENT;  Laterality: Left;  . BREATH TEK H PYLORI N/A 10/08/2013   Procedure: BREATH TEK H PYLORI;  Surgeon: Gayland Curry, MD;  Location: [facility];  Service: General;  Laterality: N/A;  . BREATH TEK H PYLORI N/A 12/03/2013   Procedure: BREATH TEK Kandis Ban;  Surgeon: Gayland Curry, MD;  Location: [facility];  Service: General;  Laterality: N/A;  . CHOLECYSTECTOMY N/A 04/13/2016   Procedure: LAPAROSCOPIC CHOLECYSTECTOMY WITH ATTEMPTED INTRAOPERATIVE CHOLANGIOGRAM;  Surgeon: Greer Pickerel, MD;  Location: [facility];  Service: General;  Laterality: N/A;  . COLONOSCOPY    . EYE SURGERY Bilateral 2017   cataract surgery with lens implant  . INNER EAR SURGERY Left 1965   RADICAL MASTOIDECTOMY  . LAPAROSCOPIC GASTRIC SLEEVE RESECTION N/A 12/18/2013   Procedure: LAPAROSCOPIC GASTRIC SLEEVE RESECTION;  Surgeon: Greer Pickerel, MD;  Location: [facility];  Service: General;  Laterality: N/A;  . REPAIR TENDONS FOOT Right 1994   AND RECONSTRUCTIVE SURGERY FOR MULTIPLE FRACTURES OF THE RIGHT FOOT  . STOMACH  SURGERY    . strabismus repair    . TONSILLECTOMY    . UPPER GASTROINTESTINAL ENDOSCOPY      There were no vitals filed for this visit.   Subjective Assessment - 06/30/20 1125    Subjective Pt reports she feels she is ready to be d/c'd from therapy as her condition has improved significantly as well as due to insurance. She says she has not had any recent falls, including in past 4 weeks.    Pertinent History 70 y.o. female presents to skilled physical therapy with a referral for balance deficits. She has a past history of an hysterectomy. She has deafness in her left ear which has contributed to her being clumsy for decades. She has had several falls in the past year and reports of a right foot injury. She is having trouble stopping  herself from falling. She also had right leg pain for the past year that is only at night and will wake her up.    Limitations Walking;Lifting;Standing;House hold activities    How long can you sit comfortably? 2-3 hours    How long can you stand comfortably? 15-20 minutes    How long can you walk comfortably? 30 minutes    Patient Stated Goals "I want to get better in my balance skills and get stronger."    Currently in Pain? No/denies    Pain Onset In the past 7 days              [facility] PT Assessment - 06/30/20 0001      Standardized Balance Assessment   Standardized Balance Assessment Dynamic Gait Index      Berg Balance Test   Sit to Stand Able to stand without using hands and stabilize independently    Standing Unsupported Able to stand safely 2 minutes    Sitting with Back Unsupported but Feet Supported on Floor or Stool Able to sit safely and securely 2 minutes    Stand to Sit Sits safely with minimal use of hands    Transfers Able to transfer safely, minor use of hands    Standing Unsupported with Eyes Closed Able to stand 10 seconds safely    Standing Unsupported with Feet Together Able to place feet together independently and stand 1 minute safely    From Standing, Reach Forward with Outstretched Arm Can reach confidently >25 cm (10")    From Standing Position, Pick up Object from Floor Able to pick up shoe safely and easily    From Standing Position, Turn to Look Behind Over each Shoulder Looks behind from both sides and weight shifts well    Turn 360 Degrees Able to turn 360 degrees safely in 4 seconds or less    Standing Unsupported, Alternately Place Feet on Step/Stool Able to stand independently and safely and complete 8 steps in 20 seconds    Standing Unsupported, One Foot in Front Able to plae foot ahead of the other independently and hold 30 seconds    Standing on One Leg Able to lift leg independently and hold 5-10 seconds    Total Score 54      Dynamic Gait  Index   Level Surface Normal    Change in Gait Speed Normal    Gait with Horizontal Head Turns Normal    Gait with Vertical Head Turns Normal    Gait and Pivot Turn Normal    Step Over Obstacle Normal    Step Around Obstacles Mild Impairment    Steps  Mild Impairment    Total Score 22            TREATMENT- RETESTING GOALS  FOTO: 72%   BERG: 54/56   TUG 7.31 sec  ABC scale score: 88%  SLB: pt able to maintain approx 10 seconds on each LE  DGI: 22/24   Assessment: All goals were retested as pt states she is ready for d/c from therapy due to insurance limitations and because she feels her balance has improved significantly. Pt has met all goals, indicating improvements in static and dynamic balance, functional mobility, balance confidence and pt scores indicate pt has significantly decreased her risk for falls. Pt also reports no recent falls. Pt is independent with HEP, and PT added and reviewed additional exercises for pt to do at home to maintain gains of therapy beyond therapy. Pt verbalized understanding. The patient does not require further skilled PT at this time.     Access Code: [email or web address] URL: [email or web address] Date: 06/30/2020 Prepared by: Ricard Dillon  Exercises Sit to Stand - 1 x daily - 4 x weekly - 3 sets - 10 reps Heel rises with counter support - 1 x daily - 7 x weekly - 3 sets - 10 reps Seated Heel Toe Raises - 1 x daily - 7 x weekly - 3 sets - 10 reps Standing Single Leg Stance with Counter Support - 1 x daily - 7 x weekly - 1 sets - 2 reps - 30 hold Standing Tandem Balance with Counter Support - 1 x daily - 7 x weekly - 1 sets - 2 reps - 30 hold  *Handout corrected to say seated heel toe raises and heel raises to be performed up to 4x/week. PT also provided written instructions to perform balance exercises with chair behind her as well.     PT Education - 06/30/20 1126    Education Details Pt instructed in d/c planning and additional  HEP to maintain gains of therapy beyond therapy    Person(s) Educated Patient    Methods Explanation;Handout    Comprehension Verbalized understanding            PT Short Term Goals - 06/30/20 1109      PT SHORT TERM GOAL #1   Title Patient will be independent in home exercise program to improve strength/mobility for better functional independence with ADLs.    Baseline 05/12/2018: ongoing Pt reports she "moves how I can" at home; 3/38/2022 Pt reports feeling confident with HEP and that she is "ready to be independent."    Time 4    Period Weeks    Status Achieved    Target Date 06/09/20      PT SHORT TERM GOAL #2   Title Patient will deny any falls over past 4 weeks to demonstrate improved safety awareness at home and work.    Baseline 05/12/2020 1 fall where pt slid on some ice; 3/28 Pt reports no falls    Time 4    Period Weeks    Status Achieved    Target Date 06/09/20             PT Long Term Goals - 06/30/20 1110      PT LONG TERM GOAL #1   Title Patient will increase FOTO score to equal to or greater than 64 to demonstrate statistically significant improvement in mobility and quality of life.    Baseline 12/29: 54; 05/12/2020 FOTO 64; 3/28 72%    Time 8    Period Weeks  Status Achieved      PT LONG TERM GOAL #2   Title Patient will increase Berg Balance score by > 6 points to demonstrate decreased fall risk during functional activities.    Baseline 12/29: 40/56; 05/12/2020 52/56; 3/28 54/56    Time 8    Period Weeks    Status Achieved      PT LONG TERM GOAL #3   Title Patient will reduce timed up and go to <11 seconds to reduce fall risk and demonstrate improved transfer/gait ability.    Baseline 12/29: 12.28s; 05/12/2020 8.2 sec; 3/28 TUG 7.31 sec    Time 8    Period Weeks    Status Achieved      PT LONG TERM GOAL #4   Title Patient will increase ABC scale score >80% to demonstrate better functional mobility and better confidence with ADLs    Baseline 12/29:  73.125%; 05/12/2020 76.25%; 06/30/2020 88%    Time 8    Period Weeks    Status Achieved      PT LONG TERM GOAL #5   Title Patient will tolerate 5 seconds of single leg stance without loss of balance to improve ability to get in and out of shower safely.    Baseline 12/29: < 3 seconds of SLS; 05/12/2018 pt held SLB on B LEs for at least 10 sec before requring UE support    Time 8    Period Weeks    Status Achieved      PT LONG TERM GOAL #6   Title Patient will increase dynamic gait index score to >19/24 as to demonstrate reduced fall risk and improved dynamic gait balance for better safety with community/home ambulation.    Baseline 05/12/2020: 16/24; greatest difficulty ascdnding/descending stairs without rail, gait with horizontal/vertical head turns, stepping over obstacles and sudden change in gait speed; 06/30/2020 22/24    Time 8    Period Weeks    Status Achieved                 Plan - 06/30/20 1123    Clinical Impression Statement All goals were retested as pt states she is ready for d/c from therapy due to insurance limitations and because she feels her balance has improved significantly. Pt has met all goals, indicating improvements in static and dynamic balance, functional mobility, balance confidence and pt scores indicate pt has significantly decreased her risk for falls. Pt also reports no recent falls. Pt is independent with HEP, and PT added and reviewed additional exercises for pt to do at home to maintain gains of therapy beyond therapy. Pt verbalized understanding. The patient does not require further skilled PT at this time.    Personal Factors and Comorbidities Comorbidity 3+    Comorbidities fall risk, osteoporosis, mixed hyperlipidemia, arthralgia, myaglia    Examination-Activity Limitations Reach Overhead;Stairs;Stand;Bend;Lift    Examination-Participation Restrictions Volunteer    Stability/Clinical Decision Making Evolving/Moderate complexity    Rehab Potential  Good    PT Frequency 1x / week    PT Duration 8 weeks    PT Treatment/Interventions Gait training;Therapeutic activities;Therapeutic exercise;Balance training;Neuromuscular re-education;Patient/family education;Energy conservation;Aquatic Therapy;ADLs/Self Care Home Management;Electrical Stimulation;Moist Heat;Functional mobility training;Stair training    PT Next Visit Plan ascending/descending stairs, practicing getting onto and off of bike, gait with horizontal head turns, weight-shifts; step-strategies    PT Home Exercise Plan HEP: SLR, mini squats, heel raises; SLB at counter with chair behind pt    Consulted and Agree with Plan of Care Patient  Patient will benefit from skilled therapeutic intervention in order to improve the following deficits and impairments:  Abnormal gait,Decreased balance,Decreased endurance,Decreased mobility,Difficulty walking,Improper body mechanics,Decreased activity tolerance,Decreased safety awareness,Decreased strength  Visit Diagnosis: Unsteadiness on feet  Other abnormalities of gait and mobility     Problem List Patient Active Problem List   Diagnosis Date Noted  . Balance problems 02/18/2020  . Mixed hyperlipidemia 05/28/2019  . Left ear pain 02/15/2019  . Arthralgia 08/30/2018  . Myalgia 08/29/2018  . Vitamin D deficiency 08/29/2018  . Osteoporosis 06/14/2018  . Closed fracture of one rib of left side 04/03/2018  . Atypical lymphocytes present on peripheral blood smear 06/02/2016  . Family history of colon cancer-brother at age 68 05/27/2016  . Conductive hearing loss, bilateral 05/04/2016  . S/P laparoscopic sleeve gastrectomy 12/18/2013  . Persistent disorder of initiating or maintaining sleep 11/05/2013  . Disorder of bone and cartilage 10/17/2009   Ricard Dillon PT, DPT 06/30/2020, 11:28 AM  [place] MAIN [facility] SERVICES [address] [place], [place], [address] Phone: [phone]    Fax:  [phone]  Name: Ashtyn Meland MRN: [id number] Date of Birth: [birth year]

## 2020-07-02 ENCOUNTER — Ambulatory Visit: Payer: Medicare PPO

## 2020-07-07 ENCOUNTER — Ambulatory Visit: Payer: Medicare PPO

## 2020-07-09 ENCOUNTER — Ambulatory Visit: Payer: Medicare PPO

## 2020-07-16 ENCOUNTER — Ambulatory Visit: Payer: Medicare PPO

## 2020-07-23 ENCOUNTER — Ambulatory Visit: Payer: Medicare PPO

## 2020-07-30 ENCOUNTER — Ambulatory Visit: Payer: Medicare PPO

## 2020-10-04 ENCOUNTER — Telehealth: Payer: Medicare PPO | Admitting: Nurse Practitioner

## 2020-10-04 DIAGNOSIS — U071 COVID-19: Secondary | ICD-10-CM

## 2020-10-04 MED ORDER — MOLNUPIRAVIR EUA 200MG CAPSULE: 4.0000 | ORAL_CAPSULE | Freq: Two times a day (BID) | ORAL | 0 refills | Status: AC

## 2020-10-04 MED ORDER — ALBUTEROL SULFATE HFA 108 (90 BASE) MCG/ACT IN AERS: 2.0000 | INHALATION_SPRAY | Freq: Four times a day (QID) | RESPIRATORY_TRACT | 0 refills | Status: DC | PRN

## 2020-10-04 NOTE — Patient Instructions (Signed)
You are being prescribed MOLNUPIRAVIR for COVID-19 infection.     Please pick up your prescription at: [facility]    Please call the pharmacy or go through the drive through vs going inside if you are picking up the mediation yourself to prevent further spread. If prescribed to a [facility] affiliated pharmacy, a pharmacist will bring the medication out to your car.   ADMINISTRATION INSTRUCTIONS: Take with or without food. Swallow the tablets whole. Don't chew, crush, or break the medications because it might not work as well  For each dose of the medication, you should be taking FOUR tablets at one time, TWICE a day   Finish your full five-day course of Molnupiravir even if you feel better before you're done. Stopping this medication too early can make it less effective to prevent severe illness related to [place].    Molnupiravir is prescribed for YOU ONLY. Don't share it with others, even if they have similar symptoms as you. This medication might not be right for everyone.   Make sure to take steps to protect yourself and others while you're taking this medication in order to get well soon and to prevent others from getting sick with COVID-19.   **If you are of childbearing potential (any gender) - it is advised to not get pregnant while taking this medication and recommended that condoms are used for female partners the next 3 months after taking the medication out of extreme caution    COMMON SIDE EFFECTS: Diarrhea Nausea  Dizziness    If your COVID-19 symptoms get worse, get medical help right away. Call 911 if you experience symptoms such as worsening cough, trouble breathing, chest pain that doesn't go away, confusion, a hard time staying awake, and pale or blue-colored skin. This medication won't prevent all COVID-19 cases from getting worse.

## 2020-10-04 NOTE — Progress Notes (Signed)
Ms. ever, halberg are scheduled for a virtual visit with your provider today.    Just as we do with appointments in the office, we must obtain your consent to participate.  Your consent will be active for this visit and any virtual visit you may have with one of our providers in the next 365 days.    If you have a MyChart account, I can also send a copy of this consent to you electronically.  All virtual visits are billed to your insurance company just like a traditional visit in the office.  As this is a virtual visit, video technology does not allow for your provider to perform a traditional examination.  This may limit your provider's ability to fully assess your condition.  If your provider identifies any concerns that need to be evaluated in person or the need to arrange testing such as labs, EKG, etc, we will make arrangements to do so.    Although advances in technology are sophisticated, we cannot ensure that it will always work on either your end or our end.  If the connection with a video visit is poor, we may have to switch to a telephone visit.  With either a video or telephone visit, we are not always able to ensure that we have a secure connection.   I need to obtain your verbal consent now.   Are you willing to proceed with your visit today?   [name] [name] has provided verbal consent on 10/04/2020 for a virtual visit (video or telephone).   Apolonio Schneiders, FNP 10/04/2020  11:03 AM    Virtual Visit via Video   I connected with patient on 10/04/20 at 11:00 AM EDT by a video enabled telemedicine application and verified that I am speaking with the correct person using two identifiers.  Location patient: Home Location provider: [place] participating in the virtual visit: Patient, Provider  I discussed the limitations of evaluation and management by telemedicine and the availability of in person appointments. The patient expressed understanding and agreed to  proceed.  Subjective:   HPI:   Patient presents via [place] today after having symptoms of COVID for the past 2 days. She thought initially that she had just a cold but decided to take a test this morning because she had plans to travel. She tested positive for COVID-19 on a home test.   She has had some mild congestion, body aches on day one, was improving yesterday but then had more congestion and a low grade fever last night.   She has been vaccinated for COVID with 3 total immunizations so far.   She denies a history of COVID infection.   Today is day three of symptoms.   ROS:  +low grade fever +congestion +cough +fatigue  See pertinent positives and negatives per HPI.  Patient Active Problem List   Diagnosis Date Noted   Balance problems 02/18/2020   Mixed hyperlipidemia 05/28/2019   Left ear pain 02/15/2019   Arthralgia 08/30/2018   Myalgia 08/29/2018   Vitamin D deficiency 08/29/2018   Osteoporosis 06/14/2018   Closed fracture of one rib of left side 04/03/2018   Atypical lymphocytes present on peripheral blood smear 06/02/2016   Family history of colon cancer-brother at age 47 05/27/2016   Conductive hearing loss, bilateral 05/04/2016   S/P laparoscopic sleeve gastrectomy 12/18/2013   Persistent disorder of initiating or maintaining sleep 11/05/2013   Disorder of bone and cartilage 10/17/2009    Social History  Tobacco Use   Smoking status: Never   Smokeless tobacco: Never  Substance Use Topics   Alcohol use: No    Alcohol/week: 0.0 standard drinks    Current Outpatient Medications:    bisacodyl (DULCOLAX) 5 MG EC tablet, Take 5 mg by mouth once a week. , Disp: , Rfl:    Calcium 600-200 MG-UNIT tablet, Take 1 tablet by mouth daily., Disp: , Rfl:    calcium-vitamin D (OSCAL WITH D) 500-200 MG-UNIT TABS tablet, Take by mouth., Disp: , Rfl:    diclofenac Sodium (VOLTAREN) 1 % GEL, APPLY 2 GRAMS TO AFFECTED AREA 4 TIMES A DAY, Disp: , Rfl:    docusate  sodium (COLACE) 100 MG capsule, Take 100-200 mg by mouth See admin instructions. Take 200 mg in the Morning and 100 mg in the eveing, Disp: , Rfl:    fluticasone (FLONASE) 50 MCG/ACT nasal spray, Place 2 sprays into both nostrils daily., Disp: , Rfl:    meloxicam (MOBIC) 15 MG tablet, TAKE 1 TABLET BY MOUTH EVERY DAY (Patient taking differently: Take 15 mg by mouth as needed for pain. ), Disp: 90 tablet, Rfl: 0   nystatin (MYCOSTATIN/NYSTOP) powder, Apply topically 2 (two) times daily., Disp: 60 g, Rfl: 1   Polyethyl Glycol-Propyl Glycol (SYSTANE) 0.4-0.3 % SOLN, Apply 1 drop to eye once a week., Disp: , Rfl:    vitamin B-12 (CYANOCOBALAMIN) 500 MCG tablet, Take 500 mcg by mouth daily., Disp: , Rfl:    Vitamin D, Cholecalciferol, 1000 units TABS, Take 1,000 mcg by mouth daily. , Disp: , Rfl:   Allergies  Allergen Reactions   Augmentin [Amoxicillin-Pot Clavulanate] Diarrhea and Nausea Only   Morphine And Related Other (See Comments)    DIZZINESS HYPOTENSION HYPOXIA    Objective:   There were no vitals taken for this visit.  Patient is well-developed, well-nourished in no acute distress.  Resting comfortably at home.  Head is normocephalic, atraumatic.  No labored breathing.  Speech is clear and coherent with logical content.  Patient is alert and oriented at baseline.    Assessment and Plan:   Discussed antiviral management available. Patient has not been to PCP for labs since 02/2020- no current GFR available. Due to this Paxlovid is not currently indicated.   Offered Molnupiravir as alternative anti-viral   Patient has required albuterol inhaler in the past for URI/bronchitis. Will place on hold at pharmacy incase needed as discussed.   She will schedule follow up as needed, and will seek immediate medical attention for any acutely worsening symptoms.   Discussed isolation time.  Meds ordered this encounter  Medications   albuterol (VENTOLIN HFA) 108 (90 Base) MCG/ACT  inhaler    Sig: Inhale 2 puffs into the lungs every 6 (six) hours as needed for wheezing or shortness of breath.    Dispense:  8 g    Refill:  0   molnupiravir EUA 200 mg CAPS    Sig: Take 4 capsules (800 mg total) by mouth 2 (two) times daily for 5 days.    Dispense:  40 capsule    Refill:  0    I spent approximately 20 minutes consulting patient over video visit and coordinating her care today.    Apolonio Schneiders, FNP 10/04/2020

## 2020-10-29 ENCOUNTER — Encounter: Payer: Self-pay | Admitting: Podiatry

## 2020-10-29 ENCOUNTER — Other Ambulatory Visit: Payer: Self-pay

## 2020-10-29 ENCOUNTER — Ambulatory Visit: Payer: Medicare PPO | Admitting: Podiatry

## 2020-10-29 DIAGNOSIS — G5792 Unspecified mononeuropathy of left lower limb: Secondary | ICD-10-CM

## 2020-10-29 DIAGNOSIS — M7671 Peroneal tendinitis, right leg: Secondary | ICD-10-CM | POA: Diagnosis not present

## 2020-10-29 DIAGNOSIS — M19171 Post-traumatic osteoarthritis, right ankle and foot: Secondary | ICD-10-CM

## 2020-10-29 DIAGNOSIS — S8264XS Nondisplaced fracture of lateral malleolus of right fibula, sequela: Secondary | ICD-10-CM | POA: Diagnosis not present

## 2020-10-29 DIAGNOSIS — S93411A Sprain of calcaneofibular ligament of right ankle, initial encounter: Secondary | ICD-10-CM | POA: Diagnosis not present

## 2020-10-29 DIAGNOSIS — T8484XA Pain due to internal orthopedic prosthetic devices, implants and grafts, initial encounter: Secondary | ICD-10-CM

## 2020-10-29 DIAGNOSIS — M25371 Other instability, right ankle: Secondary | ICD-10-CM

## 2020-10-29 NOTE — Progress Notes (Signed)
  Subjective:  Patient ID: [name] [name], female    DOB: 03-Nov-1950,  MRN: [id number]  Chief Complaint  Patient presents with   Plantar Fasciitis      BILAT FOOT PAIN    70 y.o. female presents with the above complaint. History confirmed with patient.  She has had issues with the right ankle before she previously fractured this several years ago.  She was treated nonoperatively.  She also had seen Dr. Milinda Pointer thought was plantar fasciitis at some point.  Left foot also suffered multiple fractures previously that was treated by a podiatrist with [facility].  The right ankle and foot has become increasingly swollen and painful recently.  She is trying to be very active and walks up to 10,000 steps a day.  She says around 6000 steps this morning really starts to hurt and swell.  No recent specific injuries.  She says she feels unstable on her feet and it is often bothersome for her.  Objective:  Physical Exam: warm, good capillary refill, no trophic changes or ulcerative lesions, normal DP and PT pulses, and normal sensory exam. Left Foot: Palpable orthopedic hardware in the midfoot, cannot reproduce specifically the pain she is having there is no Tinel's sign in the dorsal foot Right Foot: Mild pain and edema with a palpable lipoma over the anterolateral ankle, she has pain on palpation along the peroneal tendons to their insertion as well as pain on the CFL with laxity and inversion.  Negative anterior drawer test.    Radiographs: Multiple views x-ray of the right ankle are reviewed from her previous films, there is maintenance of the joint space with no evidence of current fracture Assessment:   1. Instability of right ankle joint   2. Sprain of calcaneofibular ligament of right ankle, initial encounter   3. Closed nondisplaced fracture of lateral malleolus of right fibula, sequela   4. Neuritis of foot, left   5. Pain due to internal orthopedic prosthetic devices, implants and grafts,  initial encounter ([facility])   6. Peroneal tendinitis, right   7. Post-traumatic osteoarthritis of right ankle      Plan:  Patient was evaluated and treated and all questions answered.  Pain in her left foot think is twofold 1 is overcompensation from pain in the right foot as well as possible sugar tightness and peripheral neuritis from a combination of her activity level as well as the orthopedic hardware she has in.  I think this hopefully will improve with sugar modifications, topical anti-inflammatory such as Voltaren and as we improve her right foot.   Right foot and ankle pain appears to be chronic and could be sequela of her previous ankle fracture.  She has pain over the peroneals and with stress examination of the lateral ankle ligamentous complex.  I reviewed her previous radiographs which were nonrevealing.  I recommended MRI for further evaluation for this chronic issue that has been ongoing for several months now.  Tri-Lock ankle brace was dispensed for support.  Recommended topical Voltaren in addition to her OTC NSAIDs.  Return in 1 month for follow-up and review of MRI findings  No follow-ups on file.

## 2020-11-12 ENCOUNTER — Telehealth: Payer: Self-pay | Admitting: *Deleted

## 2020-11-12 ENCOUNTER — Encounter: Payer: Self-pay | Admitting: Podiatry

## 2020-11-12 NOTE — Telephone Encounter (Signed)
"  An MRI was ordered by Dr. Sherryle Lis some time ago.  The imaging place had called me

## 2020-11-18 ENCOUNTER — Ambulatory Visit
Admission: RE | Admit: 2020-11-18 | Discharge: 2020-11-18 | Disposition: A | Discharge status: Home / Self Care | Payer: Medicare PPO | Source: Ambulatory Visit | Attending: Podiatry | Admitting: Podiatry

## 2020-11-18 ENCOUNTER — Other Ambulatory Visit: Payer: Self-pay

## 2020-11-18 DIAGNOSIS — S8264XS Nondisplaced fracture of lateral malleolus of right fibula, sequela: Secondary | ICD-10-CM

## 2020-11-18 DIAGNOSIS — M25371 Other instability, right ankle: Secondary | ICD-10-CM

## 2020-11-18 DIAGNOSIS — S93411A Sprain of calcaneofibular ligament of right ankle, initial encounter: Secondary | ICD-10-CM

## 2020-11-18 DIAGNOSIS — M7671 Peroneal tendinitis, right leg: Secondary | ICD-10-CM

## 2020-11-18 DIAGNOSIS — M19171 Post-traumatic osteoarthritis, right ankle and foot: Secondary | ICD-10-CM

## 2020-11-18 DIAGNOSIS — R600 Localized edema: Secondary | ICD-10-CM | POA: Diagnosis not present

## 2020-11-18 DIAGNOSIS — M19071 Primary osteoarthritis, right ankle and foot: Secondary | ICD-10-CM | POA: Diagnosis not present

## 2020-11-24 ENCOUNTER — Other Ambulatory Visit: Payer: Self-pay

## 2020-11-24 ENCOUNTER — Ambulatory Visit: Payer: Medicare PPO | Admitting: Podiatry

## 2020-11-24 DIAGNOSIS — M958 Other specified acquired deformities of musculoskeletal system: Secondary | ICD-10-CM | POA: Diagnosis not present

## 2020-11-24 DIAGNOSIS — M25371 Other instability, right ankle: Secondary | ICD-10-CM

## 2020-11-24 DIAGNOSIS — D1723 Benign lipomatous neoplasm of skin and subcutaneous tissue of right leg: Secondary | ICD-10-CM

## 2020-11-24 DIAGNOSIS — S93411A Sprain of calcaneofibular ligament of right ankle, initial encounter: Secondary | ICD-10-CM | POA: Diagnosis not present

## 2020-11-24 DIAGNOSIS — S8264XS Nondisplaced fracture of lateral malleolus of right fibula, sequela: Secondary | ICD-10-CM

## 2020-11-24 NOTE — Progress Notes (Signed)
Subjective:  Patient ID: [name] [name], female    DOB: 1950-10-22,  MRN: [id number]  Chief Complaint  Patient presents with   ankle instability    Right ankle - follow up MRI results    70 y.o. female returns for follow-up with the above complaint. History confirmed with patient.  She completed the MRI.  The Tri-Lock ankle brace has been helpful.  There is less pain in the ankle and is more so in the foot knee and hip now.  The knee and hip are particularly bothersome.  Objective:  Physical Exam: warm, good capillary refill, no trophic changes or ulcerative lesions, normal DP and PT pulses, and normal sensory exam. Left Foot: Palpable orthopedic hardware in the midfoot, cannot reproduce specifically the pain she is having there is no Tinel's sign in the dorsal foot Right Foot: Mild tenderness over a palpable lipoma over the anterolateral ankle, today she has no pain on palpation along the peroneal tendons, no pain on lateral ankle ligaments.  Negative anterior drawer test.  Good range of motion of the ankle that is pain-free without pain in the medial gutter or anterior joint line    Radiographs: Multiple views x-ray of the right ankle are reviewed from her previous films, there is maintenance of the joint space with no evidence of current fracture  Study Result  Narrative & Impression  CLINICAL DATA:  Chronic ankle pain, instability suspected. Tendon abnormality suspected. Osteoarthritis suspected. Increasing lateral ankle pain and swelling over the last 6 months. History of right foot fractures and tendon repairs in 1994.   EXAM: MRI OF THE RIGHT ANKLE WITHOUT CONTRAST   TECHNIQUE: Multiplanar, multisequence MR imaging of the ankle was performed. No intravenous contrast was administered.   COMPARISON:  Radiographs 05/16/2020, 08/09/2016 and 06/24/2016.   FINDINGS: TENDONS   Peroneal: Intact and normally positioned.   Posteromedial: Intact and normally  positioned.   Anterior: Intact and normally positioned.   Achilles: Intact.   Plantar Fascia: Intact.   LIGAMENTS   Lateral: The anterior and posterior talofibular and calcaneofibular ligaments are intact.The inferior tibiofibular ligaments appear intact.   Medial: The deltoid and visualized portions of the spring ligament appear intact.   CARTILAGE AND BONES   Ankle Joint: No significant ankle joint effusion. There is a chronic appearing osteochondral lesion of the talar dome medially, measuring 1.7 x 1.0 cm on axial image 13/3. This has sclerotic margins, internal cyst formation and surrounding edema. There is overlying chondral thinning and surface irregularity, best seen on the coronal images. No unstable or displaced fragment identified. There is a smaller osteochondral lesion of the talar dome anterolaterally as well. The tibial plafond and appears unremarkable.   Subtalar Joints/Sinus Tarsi: Unremarkable.   Bones: Midfoot degenerative changes with scattered subchondral cysts medially in the cuboid, and within the medial and middle cuneiform bones. Prominent degenerative changes at the 1st metatarsophalangeal joint are partially imaged on the sagittal images. No evidence of acute fracture or dislocation. No significant posttraumatic deformity of the distal fibula.   Other: A marker was placed laterally, anterior to the distal fibula. The underlying subcutaneous fat in this area appears prominent. There is no focal fluid collection or mass lesion.   IMPRESSION: 1. Chronic appearing talar dome osteochondral lesions as described, likely degenerative or posttraumatic. No unstable fragment identified. 2. Midfoot and 1st MTP degenerative changes. No acute osseous findings. 3. The ankle tendons and ligaments appear intact.     Electronically Signed   By: Richardean Sale  M.D.   On: 11/19/2020 13:30   Assessment:   1. Instability of right ankle joint   2. Sprain  of calcaneofibular ligament of right ankle, initial encounter   3. Osteochondral defect of talus   4. Closed nondisplaced fracture of lateral malleolus of right fibula, sequela   5. Lipoma of right lower extremity       Plan:  Patient was evaluated and treated and all questions answered.  Overall she has had improvement with the Tri-Lock ankle brace but this is not likely a long-term solution.  I reviewed the findings of the MRI with her and how this correlates to her clinical symptoms.  The most significant finding on MRI was a large osteochondral lesion which I think likely she will require total ankle replacement or fusion at some point but she currently does not have pains consistent with this.  I think most of the pain is more consistent with weakness in the lateral ankle and instability.  I recommend physical therapy again especially now that her hip and knee is bothering her.  I think be best if she had an orthopedic evaluation as well and she has been established patient at [facility] and she will schedule appoint with them for evaluation of the hip knee and ankle together.  The interim I will refer her to [facility] physical therapy for stability strengthening and conditioning of the right ankle  No follow-ups on file.

## 2020-11-25 ENCOUNTER — Ambulatory Visit (INDEPENDENT_AMBULATORY_CARE_PROVIDER_SITE_OTHER): Payer: Medicare PPO

## 2020-11-25 ENCOUNTER — Encounter: Payer: Self-pay | Admitting: Orthopedic Surgery

## 2020-11-25 ENCOUNTER — Ambulatory Visit: Payer: Medicare PPO | Admitting: Orthopedic Surgery

## 2020-11-25 DIAGNOSIS — M25551 Pain in right hip: Secondary | ICD-10-CM | POA: Diagnosis not present

## 2020-11-25 DIAGNOSIS — M93271 Osteochondritis dissecans, right ankle and joints of right foot: Secondary | ICD-10-CM | POA: Diagnosis not present

## 2020-11-25 DIAGNOSIS — M25561 Pain in right knee: Secondary | ICD-10-CM | POA: Diagnosis not present

## 2020-11-25 DIAGNOSIS — H6123 Impacted cerumen, bilateral: Secondary | ICD-10-CM | POA: Diagnosis not present

## 2020-11-25 NOTE — Progress Notes (Addendum)
Office Visit Note   Patient: [name] [name]           Date of Birth: [birth year]           MRN: [id number] Visit Date: 11/25/2020              Requested by: Lesleigh Noe, MD [place],  [place] [id number] PCP: Lesleigh Noe, MD  Chief Complaint  Patient presents with   Right Ankle - Pain      HPI: Patient is a 70 year old woman who is seen for initial evaluation for her symptoms of the right lower extremity.  Patient has been having problems with her right ankle she has had an MRI scan which shows a large osteochondral defect of the medial and posterior medial talar dome.  Patient states that with ambulation she has significant lateral right knee pain and also has lateral right hip pain.  Patient states that with ambulating her knee pain seems to be the worst.  Patient has a history of osteoporosis and a history of compression fractures in her back but she is not sure where.  Assessment & Plan: Visit Diagnoses:  1. Right knee pain, unspecified chronicity   2. Pain in right hip   3. Osteochondritis dissecans of right ankle     Plan: The right knee was injected we will see if this helps with her knee symptoms discussed that we may need to obtain an MRI scan of her lumbar spine at follow-up to evaluate for the history of compression fractures.  Follow-Up Instructions: Return in about 3 weeks (around 12/16/2020).   Ortho Exam  Patient is alert, oriented, no adenopathy, well-dressed, normal affect, normal respiratory effort. Examination patient has a good pulse she has negative straight leg raise and no focal motor weakness in the right lower extremity.  She has no pain with range of motion of the right hip she is maximally tender to palpation over the lateral joint line of the right knee the patellofemoral joint is nontender to palpation there is minimal crepitation with range of motion.  Examination of the right ankle she is tender to palpation over the lateral  joint line but is not tender to palpation over the medial joint line where the osteochondral defect is of the medial talar dome.  Review of the MRI scan shows 2 osteochondral defects of the medial talar dome.  Imaging: XR HIP UNILAT W OR W/O PELVIS 1V RIGHT  Result Date: 11/25/2020 2 view radiographs of the right hip shows a congruent joint space.  No evidence of a pelvic fracture the visualized portion of the lower lumbar spine shows no significant pathology.  XR Knee 1-2 Views Right  Result Date: 11/25/2020 2 view radiographs of the right knee shows a congruent joint space no subcondylar cysts no periarticular bony spurs.  No images are attached to the encounter.  Labs: Lab Results  Component Value Date   LABURIC 4.3 09/30/2016   REPTSTATUS 10/17/2016 FINAL 10/15/2016   CULT MULTIPLE SPECIES PRESENT, SUGGEST RECOLLECTION (A) 10/15/2016   LABORGA ESCHERICHIA COLI 11/25/2015     Lab Results  Component Value Date   ALBUMIN 4.2 02/18/2020   ALBUMIN 4.7 11/24/2017   ALBUMIN 4.2 10/13/2017    No results found for: MG Lab Results  Component Value Date   VD25OH 52 09/15/2018   VD25OH 26.06 (L) 10/13/2017   VD25OH 27.2 (L) 04/09/2016    No results found for: PREALBUMIN CBC EXTENDED Latest Ref Rng &  Units 02/18/2020 01/18/2019 09/15/2018  WBC 4.0 - 10.5 K/uL 9.3 10.8(H) 8.9  RBC 3.87 - 5.11 Mil/uL 4.14 4.11 4.04  HGB 12.0 - 15.0 g/dL 13.3 13.2 12.9  HCT 36.0 - 46.0 % 39.5 40.6 38.6  PLT 150.0 - 400.0 K/uL 317.0 333 307  NEUTROABS 1,500 - 7,800 cells/uL - - 4,183  LYMPHSABS 850 - 3,900 cells/uL - - 4,041(H)     There is no height or weight on file to calculate BMI.  Orders:  Orders Placed This Encounter  Procedures   XR Knee 1-2 Views Right   XR HIP UNILAT W OR W/O PELVIS 1V RIGHT   No orders of the defined types were placed in this encounter.    Procedures: Large Joint Inj: R knee on 12/16/2020 4:21 PM Indications: pain and diagnostic evaluation Details: 22 G 1.5  in needle, anteromedial approach  Arthrogram: No  Medications: 5 mL lidocaine (PF) 1 %; 40 mg methylPREDNISolone acetate 40 MG/ML Outcome: tolerated well, no immediate complications Procedure, treatment alternatives, risks and benefits explained, specific risks discussed. Consent was given by the patient. Immediately prior to procedure a time out was called to verify the correct patient, procedure, equipment, support staff and site/side marked as required. Patient was prepped and draped in the usual sterile fashion.     Clinical Data: No additional findings.  ROS:  All other systems negative, except as noted in the HPI. Review of Systems  Objective: Vital Signs: There were no vitals taken for this visit.  Specialty Comments:  No specialty comments available.  PMFS History: Patient Active Problem List   Diagnosis Date Noted   Balance problems 02/18/2020   Mixed hyperlipidemia 05/28/2019   Left ear pain 02/15/2019   Arthralgia 08/30/2018   Myalgia 08/29/2018   Vitamin D deficiency 08/29/2018   Osteoporosis 06/14/2018   Closed fracture of one rib of left side 04/03/2018   Atypical lymphocytes present on peripheral blood smear 06/02/2016   Family history of colon cancer-brother at age 86 05/27/2016   Conductive hearing loss, bilateral 05/04/2016   S/P laparoscopic sleeve gastrectomy 12/18/2013   Persistent disorder of initiating or maintaining sleep 11/05/2013   Disorder of bone and cartilage 10/17/2009   Past Medical History:  Diagnosis Date   Allergy    Anemia    IN THE PAST WITH PREGNANCY   Arthritis    LEFT FOOT; OCCAS LOWER BACK PAIN - HX OF VERTEBRAL FRACTURES   Blood transfusion without reported diagnosis    DURING PREGNANCY   Cataract    removed both eyes    Complication of anesthesia    a little sluggist when waking up-no intervention required   Constipation    uses Colace 3 x a day and Dulcolax q week- with this has soft BM's- no pain    Coronary artery  spasm (HCC) 1990'S   AFTER OTC DIET PILLS - PT EXPERIENCED CHEST PAIN- PT HAD CARDIAC CATH - TOLD NO CAD - THOUGHT TO HAVE HAS CORNARY ARTERY SPASM   GERD (gastroesophageal reflux disease)    mild    H/O seasonal allergies    Headache(784.0)    OCCAS SINUS HEADACHES   Hearing impaired    LEFT EAR DEAFNESS   Hypertension    220/104 IN SPRING 2015 - SAW HER DOCTOR - BUT B/P NORMALIZED - PT DECIDED TO STOP CAFFEINE AND HAS NOT HAD ANY ELEVATED PRESSURES SINCE JUNE   Osteopenia    Strabismus    HAD STRABISMUS SURGERY 1956 - BUT STILL HAS  STRABISMUS   Vitamin D deficiency     Family History  Problem Relation Age of Onset   Hypertension Mother    Diabetes Mother    Obesity Mother    Heart disease Mother    Alcohol abuse Father    Colon cancer Brother 57   COPD Maternal Grandmother    Hyperlipidemia Maternal Grandmother    COPD Paternal Grandfather    Colon polyps Sister    Stomach cancer Neg Hx    Rectal cancer Neg Hx    Esophageal cancer Neg Hx    Pancreatic cancer Neg Hx     Past Surgical History:  Procedure Laterality Date   ABDOMINAL HYSTERECTOMY     BONE ANCHORED HEARING AID IMPLANT Left 01/26/2019   Procedure: BONE ANCHORED HEARING AID (BAHA) IMPLANT;  Surgeon: [name], MD;  Location: [facility];  Service: ENT;  Laterality: Left;   BREATH TEK H PYLORI N/A 10/08/2013   Procedure: [place];  Surgeon: Gayland Curry, MD;  Location: [facility];  Service: General;  Laterality: N/A;   BREATH TEK H PYLORI N/A 12/03/2013   Procedure: Lauris Chroman;  Surgeon: Gayland Curry, MD;  Location: [facility];  Service: General;  Laterality: N/A;   CHOLECYSTECTOMY N/A 04/13/2016   Procedure: LAPAROSCOPIC CHOLECYSTECTOMY WITH ATTEMPTED INTRAOPERATIVE CHOLANGIOGRAM;  Surgeon: Greer Pickerel, MD;  Location: [facility];  Service: General;  Laterality: N/A;   COLONOSCOPY     EYE SURGERY Bilateral 2017   cataract surgery with lens implant   INNER EAR SURGERY Left [place] GASTRIC SLEEVE RESECTION N/A 12/18/2013   Procedure: LAPAROSCOPIC GASTRIC SLEEVE RESECTION;  Surgeon: Greer Pickerel, MD;  Location: [facility];  Service: General;  Laterality: N/A;   REPAIR TENDONS FOOT Right 1994   AND RECONSTRUCTIVE SURGERY FOR MULTIPLE FRACTURES OF THE RIGHT FOOT   STOMACH SURGERY     strabismus repair     TONSILLECTOMY     UPPER GASTROINTESTINAL ENDOSCOPY     Social History   Occupational History   Occupation: Programmer, multimedia: [employer]  Tobacco Use   Smoking status: Never   Smokeless tobacco: Never  Vaping Use   Vaping Use: Never used  Substance and Sexual Activity   Alcohol use: No    Alcohol/week: 0.0 standard drinks   Drug use: No   Sexual activity: Yes    Birth control/protection: Surgical

## 2020-11-26 ENCOUNTER — Encounter: Payer: Self-pay | Admitting: Orthopedic Surgery

## 2020-12-01 DIAGNOSIS — I10 Essential (primary) hypertension: Secondary | ICD-10-CM | POA: Diagnosis not present

## 2020-12-01 DIAGNOSIS — M25551 Pain in right hip: Secondary | ICD-10-CM | POA: Diagnosis not present

## 2020-12-01 DIAGNOSIS — E559 Vitamin D deficiency, unspecified: Secondary | ICD-10-CM | POA: Diagnosis not present

## 2020-12-01 DIAGNOSIS — R0789 Other chest pain: Secondary | ICD-10-CM | POA: Diagnosis not present

## 2020-12-01 DIAGNOSIS — H9190 Unspecified hearing loss, unspecified ear: Secondary | ICD-10-CM | POA: Diagnosis not present

## 2020-12-01 DIAGNOSIS — Z0000 Encounter for general adult medical examination without abnormal findings: Secondary | ICD-10-CM | POA: Diagnosis not present

## 2020-12-01 DIAGNOSIS — E611 Iron deficiency: Secondary | ICD-10-CM | POA: Diagnosis not present

## 2020-12-01 DIAGNOSIS — E782 Mixed hyperlipidemia: Secondary | ICD-10-CM | POA: Diagnosis not present

## 2020-12-01 DIAGNOSIS — M25561 Pain in right knee: Secondary | ICD-10-CM | POA: Diagnosis not present

## 2020-12-01 DIAGNOSIS — K911 Postgastric surgery syndromes: Secondary | ICD-10-CM | POA: Diagnosis not present

## 2020-12-01 DIAGNOSIS — E669 Obesity, unspecified: Secondary | ICD-10-CM | POA: Diagnosis not present

## 2020-12-01 DIAGNOSIS — I201 Angina pectoris with documented spasm: Secondary | ICD-10-CM | POA: Diagnosis not present

## 2020-12-01 DIAGNOSIS — K219 Gastro-esophageal reflux disease without esophagitis: Secondary | ICD-10-CM | POA: Diagnosis not present

## 2020-12-05 NOTE — Telephone Encounter (Signed)
The patient had a MRI on 11/18/2020 @ 4:00 pm.

## 2020-12-11 ENCOUNTER — Ambulatory Visit (INDEPENDENT_AMBULATORY_CARE_PROVIDER_SITE_OTHER): Payer: Medicare PPO

## 2020-12-11 ENCOUNTER — Other Ambulatory Visit: Payer: Self-pay | Admitting: Radiology

## 2020-12-11 DIAGNOSIS — R002 Palpitations: Secondary | ICD-10-CM

## 2020-12-11 DIAGNOSIS — R0789 Other chest pain: Secondary | ICD-10-CM

## 2020-12-11 NOTE — Progress Notes (Unsigned)
Enrolled patient for a 14 day Zio XT  monitor to be mailed to patients home  °

## 2020-12-16 ENCOUNTER — Encounter: Payer: Self-pay | Admitting: Orthopedic Surgery

## 2020-12-16 ENCOUNTER — Other Ambulatory Visit: Payer: Self-pay

## 2020-12-16 ENCOUNTER — Ambulatory Visit: Payer: Medicare PPO | Admitting: Orthopedic Surgery

## 2020-12-16 DIAGNOSIS — M79674 Pain in right toe(s): Secondary | ICD-10-CM

## 2020-12-16 DIAGNOSIS — R002 Palpitations: Secondary | ICD-10-CM

## 2020-12-16 DIAGNOSIS — R0789 Other chest pain: Secondary | ICD-10-CM

## 2020-12-16 DIAGNOSIS — M25561 Pain in right knee: Secondary | ICD-10-CM

## 2020-12-16 DIAGNOSIS — M5417 Radiculopathy, lumbosacral region: Secondary | ICD-10-CM | POA: Diagnosis not present

## 2020-12-16 MED ORDER — LIDOCAINE HCL (PF) 1 % IJ SOLN
5.0000 mL | INTRAMUSCULAR | Status: AC | PRN
  Administered 2020-12-16: 5 mL

## 2020-12-16 MED ORDER — PREDNISONE 10 MG PO TABS: 10.0000 mg | ORAL_TABLET | Freq: Every day | ORAL | 0 refills | Status: DC

## 2020-12-16 MED ORDER — METHYLPREDNISOLONE ACETATE 40 MG/ML IJ SUSP
40.0000 mg | INTRAMUSCULAR | Status: AC | PRN
  Administered 2020-12-16: 40 mg via INTRA_ARTICULAR

## 2020-12-16 NOTE — Progress Notes (Signed)
Office Visit Note   Patient: [name] [name]           Date of Birth: 1950-05-22           MRN: [id number] Visit Date: 12/16/2020              Requested by: Lesleigh Noe, MD [address],  [place] [id number] PCP: Margretta Sidle, MD  Chief Complaint  Patient presents with   Right Knee - Follow-up    S/p injection 11/25/20      HPI: Patient is a 70 year old woman who presents with 3 separate problems.  She is status post a right knee injection for osteoarthritis which she states lasted about a week.  Patient states she now has pain that radiates from her buttocks down the lateral aspect of the right leg she still has right knee pain and she has been having pain in the right great toe.  Patient states that she also has developed some recent generalized body itching and she has just started taking a iron supplement prior to the itching.  Assessment & Plan: Visit Diagnoses:  1. Right knee pain, unspecified chronicity   2. Great toe pain, right   3. Radicular pain of lumbosacral region     Plan: Recommend she hold on the iron supplement and see if this resolves the itching.  We will call in a prescription for prednisone 10 mg with breakfast daily to see if this will help the radicular symptoms.  At follow-up in 3 weeks we will obtain 2 view radiographs of the lumbar spine and may need to consider an MRI scan of the lumbar spine.  Follow-Up Instructions: Return in about 3 weeks (around 01/06/2021).   Ortho Exam  Patient is alert, oriented, no adenopathy, well-dressed, normal affect, normal respiratory effort. Examination patient has an antalgic gait she has pain at the MTP joint of the right great toe with passive range of motion.  She has no focal motor weakness on the right side and negative straight leg raise on the right.  Patient states the right-sided radicular pain is worse at night.  Patient is tender to palpation around the right knee.  Imaging: No results  found. No images are attached to the encounter.  Labs: Lab Results  Component Value Date   LABURIC 4.3 09/30/2016   REPTSTATUS 10/17/2016 FINAL 10/15/2016   CULT MULTIPLE SPECIES PRESENT, SUGGEST RECOLLECTION (A) 10/15/2016   LABORGA ESCHERICHIA COLI 11/25/2015     Lab Results  Component Value Date   ALBUMIN 4.2 02/18/2020   ALBUMIN 4.7 11/24/2017   ALBUMIN 4.2 10/13/2017    No results found for: MG Lab Results  Component Value Date   VD25OH 52 09/15/2018   VD25OH 26.06 (L) 10/13/2017   VD25OH 27.2 (L) 04/09/2016    No results found for: PREALBUMIN CBC EXTENDED Latest Ref Rng & Units 02/18/2020 01/18/2019 09/15/2018  WBC 4.0 - 10.5 K/uL 9.3 10.8(H) 8.9  RBC 3.87 - 5.11 Mil/uL 4.14 4.11 4.04  HGB 12.0 - 15.0 g/dL 13.3 13.2 12.9  HCT 36.0 - 46.0 % 39.5 40.6 38.6  PLT 150.0 - 400.0 K/uL 317.0 333 307  NEUTROABS 1,500 - 7,800 cells/uL - - 4,183  LYMPHSABS 850 - 3,900 cells/uL - - 4,041(H)     There is no height or weight on file to calculate BMI.  Orders:  No orders of the defined types were placed in this encounter.  Meds ordered this encounter  Medications   predniSONE (  DELTASONE) 10 MG tablet    Sig: Take 1 tablet (10 mg total) by mouth daily with breakfast.    Dispense:  30 tablet    Refill:  0     Procedures: No procedures performed  Clinical Data: No additional findings.  ROS:  All other systems negative, except as noted in the HPI. Review of Systems  Objective: Vital Signs: There were no vitals taken for this visit.  Specialty Comments:  No specialty comments available.  PMFS History: Patient Active Problem List   Diagnosis Date Noted   Balance problems 02/18/2020   Mixed hyperlipidemia 05/28/2019   Left ear pain 02/15/2019   Arthralgia 08/30/2018   Myalgia 08/29/2018   Vitamin D deficiency 08/29/2018   Osteoporosis 06/14/2018   Closed fracture of one rib of left side 04/03/2018   Atypical lymphocytes present on peripheral blood smear  06/02/2016   Family history of colon cancer-brother at age 16 05/27/2016   Conductive hearing loss, bilateral 05/04/2016   S/P laparoscopic sleeve gastrectomy 12/18/2013   Persistent disorder of initiating or maintaining sleep 11/05/2013   Disorder of bone and cartilage 10/17/2009   Past Medical History:  Diagnosis Date   Allergy    Anemia    IN THE PAST WITH PREGNANCY   Arthritis    LEFT FOOT; OCCAS LOWER BACK PAIN - HX OF VERTEBRAL FRACTURES   Blood transfusion without reported diagnosis    DURING PREGNANCY   Cataract    removed both eyes    Complication of anesthesia    a little sluggist when waking up-no intervention required   Constipation    uses Colace 3 x a day and Dulcolax q week- with this has soft BM's- no pain    Coronary artery spasm (HCC) 1990'S   AFTER OTC DIET PILLS - PT EXPERIENCED CHEST PAIN- PT HAD CARDIAC CATH - TOLD NO CAD - THOUGHT TO HAVE HAS CORNARY ARTERY SPASM   GERD (gastroesophageal reflux disease)    mild    H/O seasonal allergies    Headache(784.0)    OCCAS SINUS HEADACHES   Hearing impaired    LEFT EAR DEAFNESS   Hypertension    220/104 IN SPRING 2015 - SAW HER DOCTOR - BUT B/P NORMALIZED - PT DECIDED TO STOP CAFFEINE AND HAS NOT HAD ANY ELEVATED PRESSURES SINCE JUNE   Osteopenia    Strabismus    HAD STRABISMUS SURGERY 1956 - BUT STILL HAS STRABISMUS   Vitamin D deficiency     Family History  Problem Relation Age of Onset   Hypertension Mother    Diabetes Mother    Obesity Mother    Heart disease Mother    Alcohol abuse Father    Colon cancer Brother 49   COPD Maternal Grandmother    Hyperlipidemia Maternal Grandmother    COPD Paternal Grandfather    Colon polyps Sister    Stomach cancer Neg Hx    Rectal cancer Neg Hx    Esophageal cancer Neg Hx    Pancreatic cancer Neg Hx     Past Surgical History:  Procedure Laterality Date   ABDOMINAL HYSTERECTOMY     BONE ANCHORED HEARING AID IMPLANT Left 01/26/2019   Procedure: BONE  ANCHORED HEARING AID (BAHA) IMPLANT;  Surgeon: [name], MD;  Location: [place];  Service: ENT;  Laterality: Left;   BREATH TEK H PYLORI N/A 10/08/2013   Procedure: BREATH TEK H PYLORI;  Surgeon: Gayland Curry, MD;  Location: [facility];  Service: General;  Laterality: N/A;  BREATH TEK H PYLORI N/A 12/03/2013   Procedure: BREATH TEK H PYLORI;  Surgeon: Gayland Curry, MD;  Location: [facility];  Service: General;  Laterality: N/A;   CHOLECYSTECTOMY N/A 04/13/2016   Procedure: LAPAROSCOPIC CHOLECYSTECTOMY WITH ATTEMPTED INTRAOPERATIVE CHOLANGIOGRAM;  Surgeon: Greer Pickerel, MD;  Location: [facility] OR;  Service: General;  Laterality: N/A;   COLONOSCOPY     EYE SURGERY Bilateral 2017   cataract surgery with lens implant   [place] GASTRIC SLEEVE RESECTION N/A 12/18/2013   Procedure: LAPAROSCOPIC GASTRIC SLEEVE RESECTION;  Surgeon: Greer Pickerel, MD;  Location: [facility];  Service: General;  Laterality: N/A;   REPAIR TENDONS FOOT Right 1994   AND RECONSTRUCTIVE SURGERY FOR MULTIPLE FRACTURES OF THE RIGHT FOOT   STOMACH SURGERY     strabismus repair     TONSILLECTOMY     UPPER GASTROINTESTINAL ENDOSCOPY     Social History   Occupational History   Occupation: Programmer, multimedia:   Tobacco Use   Smoking status: Never   Smokeless tobacco: Never  Vaping Use   Vaping Use: Never used  Substance and Sexual Activity   Alcohol use: No    Alcohol/week: 0.0 standard drinks   Drug use: No   Sexual activity: Yes    Birth control/protection: Surgical

## 2021-01-06 ENCOUNTER — Ambulatory Visit: Payer: Self-pay

## 2021-01-06 ENCOUNTER — Encounter: Payer: Self-pay | Admitting: Orthopedic Surgery

## 2021-01-06 ENCOUNTER — Ambulatory Visit: Payer: Medicare PPO | Admitting: Orthopedic Surgery

## 2021-01-06 DIAGNOSIS — M5417 Radiculopathy, lumbosacral region: Secondary | ICD-10-CM | POA: Diagnosis not present

## 2021-01-06 NOTE — Progress Notes (Signed)
Office Visit Note   Patient: [name] [name]           Date of Birth: 06/14/1950           MRN: [id number] Visit Date: 01/06/2021              Requested by: Margretta Sidle, [place],   [id number] PCP: Margretta Sidle, MD  Chief Complaint  Patient presents with   Lower Back - Pain   Right Leg - Pain      HPI: Patient is a 70 year old woman who presents in follow-up for right radicular symptoms from her lumbar spine she was started on prednisone 10 mg.  She states she took 40 mg a day for 5 days secondary to her bronchitis and this completely resolved her back symptoms.  Assessment & Plan: Visit Diagnoses:  1. Radicular pain of lumbosacral region     Plan: Recommend that she start weaning off the prednisone and take 10 mg every other day for 5 days and then wean off and reserve the remaining prednisone for any future episodes.  She will increase her activities as tolerated.  Follow-Up Instructions: No follow-ups on file.   Ortho Exam  Patient is alert, oriented, no adenopathy, well-dressed, normal affect, normal respiratory effort. Examination patient has a normal gait she has a negative straight leg raise bilaterally no focal motor weakness.  She states the back pain is 90% better.  The right sciatic radicular symptoms have resolved.  Imaging: XR Lumbar Spine 2-3 Views  Result Date: 01/06/2021 2 view radiographs of the lumbar spine shows increased lumbar lordosis no pars defect there is good joint space without compression fractures.  No images are attached to the encounter.  Labs: Lab Results  Component Value Date   LABURIC 4.3 09/30/2016   REPTSTATUS 10/17/2016 FINAL 10/15/2016   CULT MULTIPLE SPECIES PRESENT, SUGGEST RECOLLECTION (A) 10/15/2016   LABORGA ESCHERICHIA COLI 11/25/2015     Lab Results  Component Value Date   ALBUMIN 4.2 02/18/2020   ALBUMIN 4.7 11/24/2017   ALBUMIN 4.2 10/13/2017    No results found for:  MG Lab Results  Component Value Date   VD25OH 52 09/15/2018   VD25OH 26.06 (L) 10/13/2017   VD25OH 27.2 (L) 04/09/2016    No results found for: PREALBUMIN CBC EXTENDED Latest Ref Rng & Units 02/18/2020 01/18/2019 09/15/2018  WBC 4.0 - 10.5 K/uL 9.3 10.8(H) 8.9  RBC 3.87 - 5.11 Mil/uL 4.14 4.11 4.04  HGB 12.0 - 15.0 g/dL 13.3 13.2 12.9  HCT 36.0 - 46.0 % 39.5 40.6 38.6  PLT 150.0 - 400.0 K/uL 317.0 333 307  NEUTROABS 1,500 - 7,800 cells/uL - - 4,183  LYMPHSABS 850 - 3,900 cells/uL - - 4,041(H)     There is no height or weight on file to calculate BMI.  Orders:  Orders Placed This Encounter  Procedures   XR Lumbar Spine 2-3 Views   No orders of the defined types were placed in this encounter.    Procedures: No procedures performed  Clinical Data: No additional findings.  ROS:  All other systems negative, except as noted in the HPI. Review of Systems  Objective: Vital Signs: There were no vitals taken for this visit.  Specialty Comments:  No specialty comments available.  PMFS History: Patient Active Problem List   Diagnosis Date Noted   Balance problems 02/18/2020   Mixed hyperlipidemia 05/28/2019   Left ear pain 02/15/2019   Arthralgia 08/30/2018   Myalgia 08/29/2018  Vitamin D deficiency 08/29/2018   Osteoporosis 06/14/2018   Closed fracture of one rib of left side 04/03/2018   Atypical lymphocytes present on peripheral blood smear 06/02/2016   Family history of colon cancer-brother at age 19 05/27/2016   Conductive hearing loss, bilateral 05/04/2016   S/P laparoscopic sleeve gastrectomy 12/18/2013   Persistent disorder of initiating or maintaining sleep 11/05/2013   Disorder of bone and cartilage 10/17/2009   Past Medical History:  Diagnosis Date   Allergy    Anemia    IN THE PAST WITH PREGNANCY   Arthritis    LEFT FOOT; OCCAS LOWER BACK PAIN - HX OF VERTEBRAL FRACTURES   Blood transfusion without reported diagnosis    DURING PREGNANCY    Cataract    removed both eyes    Complication of anesthesia    a little sluggist when waking up-no intervention required   Constipation    uses Colace 3 x a day and Dulcolax q week- with this has soft BM's- no pain    Coronary artery spasm (HCC) 1990'S   AFTER OTC DIET PILLS - PT EXPERIENCED CHEST PAIN- PT HAD CARDIAC CATH - TOLD NO CAD - THOUGHT TO HAVE HAS CORNARY ARTERY SPASM   GERD (gastroesophageal reflux disease)    mild    H/O seasonal allergies    Headache(784.0)    OCCAS SINUS HEADACHES   Hearing impaired    LEFT EAR DEAFNESS   Hypertension    220/104 IN SPRING 2015 - SAW HER DOCTOR - BUT B/P NORMALIZED - PT DECIDED TO STOP CAFFEINE AND HAS NOT HAD ANY ELEVATED PRESSURES SINCE JUNE   Osteopenia    Strabismus    HAD STRABISMUS SURGERY 1956 - BUT STILL HAS STRABISMUS   Vitamin D deficiency     Family History  Problem Relation Age of Onset   Hypertension Mother    Diabetes Mother    Obesity Mother    Heart disease Mother    Alcohol abuse Father    Colon cancer Brother 42   COPD Maternal Grandmother    Hyperlipidemia Maternal Grandmother    COPD Paternal Grandfather    Colon polyps Sister    Stomach cancer Neg Hx    Rectal cancer Neg Hx    Esophageal cancer Neg Hx    Pancreatic cancer Neg Hx     Past Surgical History:  Procedure Laterality Date   ABDOMINAL HYSTERECTOMY     BONE ANCHORED HEARING AID IMPLANT Left 01/26/2019   Procedure: BONE ANCHORED HEARING AID (BAHA) IMPLANT;  Surgeon: [name], MD;  Location: [facility];  Service: ENT;  Laterality: Left;   BREATH TEK H PYLORI N/A 10/08/2013   Procedure: BREATH TEK H PYLORI;  Surgeon: Gayland Curry, MD;  Location: [facility];  Service: General;  Laterality: N/A;   BREATH TEK H PYLORI N/A 12/03/2013   Procedure: Lauris Chroman;  Surgeon: Gayland Curry, MD;  Location: [facility];  Service: General;  Laterality: N/A;   CHOLECYSTECTOMY N/A 04/13/2016   Procedure: LAPAROSCOPIC CHOLECYSTECTOMY WITH ATTEMPTED  INTRAOPERATIVE CHOLANGIOGRAM;  Surgeon: Greer Pickerel, MD;  Location: [facility] OR;  Service: General;  Laterality: N/A;   COLONOSCOPY     EYE SURGERY Bilateral 2017   cataract surgery with lens implant   INNER EAR SURGERY Left 1965   RADICAL MASTOIDECTOMY   LAPAROSCOPIC GASTRIC SLEEVE RESECTION N/A 12/18/2013   Procedure: LAPAROSCOPIC GASTRIC SLEEVE RESECTION;  Surgeon: Greer Pickerel, MD;  Location: [facility];  Service: General;  Laterality: N/A;  REPAIR TENDONS FOOT Right 1994   AND RECONSTRUCTIVE SURGERY FOR MULTIPLE FRACTURES OF THE RIGHT FOOT   STOMACH SURGERY     strabismus repair     TONSILLECTOMY     UPPER GASTROINTESTINAL ENDOSCOPY     Social History   Occupational History   Occupation: Programmer, multimedia: [employer]  Tobacco Use   Smoking status: Never   Smokeless tobacco: Never  Vaping Use   Vaping Use: Never used  Substance and Sexual Activity   Alcohol use: No    Alcohol/week: 0.0 standard drinks   Drug use: No   Sexual activity: Yes    Birth control/protection: Surgical

## 2021-01-09 ENCOUNTER — Other Ambulatory Visit: Payer: Self-pay

## 2021-01-09 ENCOUNTER — Encounter (HOSPITAL_COMMUNITY): Payer: Self-pay | Admitting: Emergency Medicine

## 2021-01-09 ENCOUNTER — Emergency Department (HOSPITAL_COMMUNITY): Payer: Medicare PPO

## 2021-01-09 ENCOUNTER — Emergency Department (HOSPITAL_COMMUNITY)
Admission: EM | Admit: 2021-01-09 | Discharge: 2021-01-10 | Disposition: A | Discharge status: Home / Self Care | Payer: Medicare PPO | Attending: Emergency Medicine | Admitting: Emergency Medicine

## 2021-01-09 ENCOUNTER — Other Ambulatory Visit: Payer: Self-pay | Admitting: Family Medicine

## 2021-01-09 DIAGNOSIS — R002 Palpitations: Secondary | ICD-10-CM

## 2021-01-09 DIAGNOSIS — R000 Tachycardia, unspecified: Secondary | ICD-10-CM

## 2021-01-09 DIAGNOSIS — R5383 Other fatigue: Secondary | ICD-10-CM | POA: Insufficient documentation

## 2021-01-09 DIAGNOSIS — I10 Essential (primary) hypertension: Secondary | ICD-10-CM | POA: Diagnosis not present

## 2021-01-09 DIAGNOSIS — R0789 Other chest pain: Secondary | ICD-10-CM

## 2021-01-09 DIAGNOSIS — R079 Chest pain, unspecified: Secondary | ICD-10-CM | POA: Diagnosis present

## 2021-01-09 DIAGNOSIS — Z20822 Contact with and (suspected) exposure to covid-19: Secondary | ICD-10-CM | POA: Diagnosis not present

## 2021-01-09 DIAGNOSIS — M79661 Pain in right lower leg: Secondary | ICD-10-CM | POA: Insufficient documentation

## 2021-01-09 DIAGNOSIS — R059 Cough, unspecified: Secondary | ICD-10-CM | POA: Insufficient documentation

## 2021-01-09 LAB — CBC
HCT: 39 % (ref 36.0–46.0)
Hemoglobin: 12.5 g/dL (ref 12.0–15.0)
MCH: 31.5 pg (ref 26.0–34.0)
MCHC: 32.1 g/dL (ref 30.0–36.0)
MCV: 98.2 fL (ref 80.0–100.0)
Platelets: 355 10*3/uL (ref 150–400)
RBC: 3.97 MIL/uL (ref 3.87–5.11)
RDW: 13.8 % (ref 11.5–15.5)
WBC: 9.8 10*3/uL (ref 4.0–10.5)
nRBC: 0 % (ref 0.0–0.2)

## 2021-01-09 LAB — BASIC METABOLIC PANEL
Anion gap: 10 (ref 5–15)
BUN: 24 mg/dL — ABNORMAL HIGH (ref 8–23)
CO2: 25 mmol/L (ref 22–32)
Calcium: 9.1 mg/dL (ref 8.9–10.3)
Chloride: 104 mmol/L (ref 98–111)
Creatinine, Ser: 0.84 mg/dL (ref 0.44–1.00)
GFR, Estimated: >60 mL/min (ref >60)
Glucose, Bld: 94 mg/dL (ref 70–99)
Potassium: 3.8 mmol/L (ref 3.5–5.1)
Sodium: 139 mmol/L (ref 135–145)

## 2021-01-09 LAB — TSH: TSH: 2.573 u[IU]/mL (ref 0.350–4.500)

## 2021-01-09 LAB — TROPONIN I (HIGH SENSITIVITY): Troponin I (High Sensitivity): 4 ng/L (ref <18)

## 2021-01-09 NOTE — ED Triage Notes (Signed)
Patient reports intermittent central chest pain with upper back pain and left arm numbness onset last month , no emesis or diaphoresis .

## 2021-01-09 NOTE — ED Provider Notes (Signed)
Emergency Medicine Provider Triage Evaluation Note  [name] [name] , a 70 y.o. female  was evaluated in triage.  Pt complains of cp.  Review of Systems  Positive: Cp, sob, L arm pain, occasional cough Negative: Fever, n/v, diaphoresis  Physical Exam  BP (!) 156/84 (BP Location: Left Arm)   Pulse 84   Temp 99.1 F (37.3 C) (Oral)   Resp 16   SpO2 96%  Gen:   Awake, no distress   Resp:  Normal effort  MSK:   Moves extremities without difficulty  Other:    Medical Decision Making  Medically screening exam initiated at 8:07 PM.  Appropriate orders placed.  [name] [name] was informed that the remainder of the evaluation will be completed by another provider, this initial triage assessment does not replace that evaluation, and the importance of remaining in the ED until their evaluation is complete.  Intermittent heart palpitation with associate chest discomfort ongoing for several months, but increase in frequency.  No recent cardiac stress test.  No active CP.  Recently has Holter heart monitoring for her heart palpitation.    [name] Moras, PA-C 01/09/21 2009    [name] Merino, MD 01/09/21 [phone]

## 2021-01-10 LAB — RESP PANEL BY RT-PCR (FLU A&B, COVID) ARPGX2
Influenza A by PCR: NEGATIVE
Influenza B by PCR: NEGATIVE
SARS Coronavirus 2 by RT PCR: NEGATIVE

## 2021-01-10 LAB — D-DIMER, QUANTITATIVE: D-Dimer, Quant: <0.27 ug/mL-FEU (ref 0.00–0.50)

## 2021-01-10 LAB — TROPONIN I (HIGH SENSITIVITY): Troponin I (High Sensitivity): 3 ng/L (ref <18)

## 2021-01-10 NOTE — Discharge Instructions (Addendum)
You were seen today for chest pain. Your labs and imaging was unremarkable and there are no signs today that lead me to suspect that you are having a heart attack or other emergent condition. There was a lesion found on your chest x-ray, I feel that this is an unrelated finding to your condition today and is not emergent in nature. Know that x-rays are very nonspecific in nature and can pick up findings that are inaccurate. You will need an outpatient CT scan of your chest to further evaluate this. Additionally, please establish care with a cardiologist in order to further evaluate your chest pain, I have given you a number to call with the on call cardiologist today to set up an appointment. You should also schedule an appointment with your PCP in the next few days to discuss further care and your hospital visit today.   Also, know that you may always return to the ER if your symptoms worsen.

## 2021-01-10 NOTE — ED Notes (Signed)
Walked the patient to the bathroom patient sis well

## 2021-01-10 NOTE — ED Provider Notes (Signed)
[name] EMERGENCY DEPARTMENT Provider Note   CSN: [id number] Arrival date & time: 01/09/21  1930     History Chief Complaint  Patient presents with   Chest Pain    [name] [name] is a 70 y.o. female.  Patient presents today with chest pain. States that her symptoms have been ongoing for several months with intermittent episodes of chest pain and tachycardia. States that episodes were occurring about once a week and have now increased to every day. No discernable triggers for symptoms, states that symptoms occur intermittently regardless of exercise or rest. Symptoms last approximately 15 minutes without her stopping to rest. Endorses associated fatigue with episodes without diaphoresis. She states that chest pain does not appear with any discernable pattern, normally feels sharp and well localized without radiation, however on occasion also endorses associated pressure and pain that moves down her left arm or into her jaw. No associated shortness of breath, PND, orthopnea, or leg swelling. States that she had a cardiac catheterization in the 1990s and was diagnosed with coronary artery spasm, denies these symptoms since the 90s, however states she believes pain is similar to these previous episodes. She also endorses non-productive cough over the past 2 days without fevers, chills, congestion, or rhinorrhea. She did state that she had COVID in July and has had prolonged recovery with lasting bronchitis. Also has been having worsening posterior right leg pain over the past week. No exogenous estrogen use, recent travel, recent surgeries, or past malignancy. No history of smoking.  Of note, recent 14 day Holter monitor was significant for 1 run of 4 beats of SVT, no sustained arrhythmias, rare PACs and PVCs.  The history is provided by the patient. No language interpreter was used.  Chest Pain Associated symptoms: cough and palpitations   Associated symptoms: no  abdominal pain, no dizziness, no dysphagia, no fatigue, no fever, no headache, no nausea, no numbness, no shortness of breath, no vomiting and no weakness       Past Medical History:  Diagnosis Date   Allergy    Anemia    IN THE PAST WITH PREGNANCY   Arthritis    LEFT FOOT; OCCAS LOWER BACK PAIN - HX OF VERTEBRAL FRACTURES   Blood transfusion without reported diagnosis    DURING PREGNANCY   Cataract    removed both eyes    Complication of anesthesia    a little sluggist when waking up-no intervention required   Constipation    uses Colace 3 x a day and Dulcolax q week- with this has soft BM's- no pain    Coronary artery spasm (HCC) 1990'S   AFTER OTC DIET PILLS - PT EXPERIENCED CHEST PAIN- PT HAD CARDIAC CATH - TOLD NO CAD - THOUGHT TO HAVE HAS CORNARY ARTERY SPASM   GERD (gastroesophageal reflux disease)    mild    H/O seasonal allergies    Headache(784.0)    OCCAS SINUS HEADACHES   Hearing impaired    LEFT EAR DEAFNESS   Hypertension    220/104 IN SPRING 2015 - SAW HER DOCTOR - BUT B/P NORMALIZED - PT DECIDED TO STOP CAFFEINE AND HAS NOT HAD ANY ELEVATED PRESSURES SINCE JUNE   Osteopenia    Strabismus    HAD STRABISMUS SURGERY 1956 - BUT STILL HAS STRABISMUS   Vitamin D deficiency     Patient Active Problem List   Diagnosis Date Noted   Balance problems 02/18/2020   Mixed hyperlipidemia 05/28/2019   Left ear pain  02/15/2019   Arthralgia 08/30/2018   Myalgia 08/29/2018   Vitamin D deficiency 08/29/2018   Osteoporosis 06/14/2018   Closed fracture of one rib of left side 04/03/2018   Atypical lymphocytes present on peripheral blood smear 06/02/2016   Family history of colon cancer-brother at age 58 05/27/2016   Conductive hearing loss, bilateral 05/04/2016   S/P laparoscopic sleeve gastrectomy 12/18/2013   Persistent disorder of initiating or maintaining sleep 11/05/2013   Disorder of bone and cartilage 10/17/2009    Past Surgical History:  Procedure Laterality  Date   ABDOMINAL HYSTERECTOMY     BONE ANCHORED HEARING AID IMPLANT Left 01/26/2019   Procedure: BONE ANCHORED HEARING AID (BAHA) IMPLANT;  Surgeon: [name], MD;  Location: [place];  Service: ENT;  Laterality: Left;   BREATH TEK H PYLORI N/A 10/08/2013   Procedure: BREATH TEK Kandis Ban;  Surgeon: Gayland Curry, MD;  Location: [facility];  Service: General;  Laterality: N/A;   BREATH TEK H PYLORI N/A 12/03/2013   Procedure: Lauris Chroman;  Surgeon: Gayland Curry, MD;  Location: [facility];  Service: General;  Laterality: N/A;   CHOLECYSTECTOMY N/A 04/13/2016   Procedure: LAPAROSCOPIC CHOLECYSTECTOMY WITH ATTEMPTED INTRAOPERATIVE CHOLANGIOGRAM;  Surgeon: Greer Pickerel, MD;  Location: [facility];  Service: General;  Laterality: N/A;   COLONOSCOPY     EYE SURGERY Bilateral 2017   cataract surgery with lens implant   INNER EAR SURGERY Left [place] GASTRIC SLEEVE RESECTION N/A 12/18/2013   Procedure: LAPAROSCOPIC GASTRIC SLEEVE RESECTION;  Surgeon: Greer Pickerel, MD;  Location: [facility];  Service: General;  Laterality: N/A;   REPAIR TENDONS FOOT Right 1994   AND RECONSTRUCTIVE SURGERY FOR MULTIPLE FRACTURES OF THE RIGHT FOOT   STOMACH SURGERY     strabismus repair     TONSILLECTOMY     UPPER GASTROINTESTINAL ENDOSCOPY       OB History   No obstetric history on file.     Family History  Problem Relation Age of Onset   Hypertension Mother    Diabetes Mother    Obesity Mother    Heart disease Mother    Alcohol abuse Father    Colon cancer Brother 33   COPD Maternal Grandmother    Hyperlipidemia Maternal Grandmother    COPD Paternal Grandfather    Colon polyps Sister    Stomach cancer Neg Hx    Rectal cancer Neg Hx    Esophageal cancer Neg Hx    Pancreatic cancer Neg Hx     Social History   Tobacco Use   Smoking status: Never   Smokeless tobacco: Never  Vaping Use   Vaping Use: Never used  Substance Use Topics   Alcohol use: No     Alcohol/week: 0.0 standard drinks   Drug use: No    Home Medications Prior to Admission medications   Medication Sig Start Date End Date Taking? Authorizing Provider  bisacodyl (DULCOLAX) 5 MG EC tablet Take 5 mg by mouth once a week.    Yes [provider]  Calcium 600-200 MG-UNIT tablet Take 1 tablet by mouth daily.   Yes [provider]  diclofenac Sodium (VOLTAREN) 1 % GEL Apply 2 g topically 4 (four) times daily as needed (dry skin, rash). 02/20/19  Yes [provider]  docusate sodium (COLACE) 100 MG capsule Take 100-200 mg by mouth See admin instructions. Take 200 mg in the Morning and 100 mg in the evening  Yes [provider]  FERROCITE 324 MG TABS tablet Take 324 mg of iron by mouth every Monday, Wednesday, and Friday. 12/04/20  Yes [provider]  fluticasone (FLONASE) 50 MCG/ACT nasal spray Place 2 sprays into both nostrils daily.   Yes [provider]  meloxicam (MOBIC) 15 MG tablet TAKE 1 TABLET BY MOUTH EVERY DAY Patient taking differently: Take 15 mg by mouth daily as needed for pain. 11/14/18  Yes Lucille Passy, MD  nystatin (MYCOSTATIN/NYSTOP) powder Apply topically 2 (two) times daily. Patient taking differently: Apply 1 application topically 2 (two) times daily as needed (infection). 12/13/18  Yes Lucille Passy, MD  Polyethyl Glycol-Propyl Glycol (SYSTANE) 0.4-0.3 % SOLN Apply 1 drop to eye once a week.   Yes [provider]  vitamin B-12 (CYANOCOBALAMIN) 500 MCG tablet Take 500 mcg by mouth daily.   Yes [provider]  Vitamin D, Cholecalciferol, 1000 units TABS Take 1,000 mcg by mouth daily.    Yes [provider]  albuterol (VENTOLIN HFA) 108 (90 Base) MCG/ACT inhaler Inhale 2 puffs into the lungs every 6 (six) hours as needed for wheezing or shortness of breath. Patient not taking: No sig reported 10/04/20   Apolonio Schneiders, FNP  predniSONE (DELTASONE) 10 MG tablet Take 1 tablet (10 mg total) by  mouth daily with breakfast. Patient not taking: Reported on 01/10/2021 12/16/20   Newt Minion, MD    Allergies    Augmentin [amoxicillin-pot clavulanate] and Morphine and related  Review of Systems   Review of Systems  Constitutional:  Negative for chills, fatigue and fever.  HENT:  Negative for congestion, postnasal drip, rhinorrhea, sore throat, trouble swallowing and voice change.   Respiratory:  Positive for cough. Negative for shortness of breath, wheezing and stridor.   Cardiovascular:  Positive for chest pain and palpitations. Negative for leg swelling.  Gastrointestinal:  Negative for abdominal distention, abdominal pain, constipation, diarrhea, nausea and vomiting.  Musculoskeletal:  Negative for gait problem.  Skin:  Negative for wound.  Neurological:  Negative for dizziness, tremors, seizures, syncope, facial asymmetry, speech difficulty, weakness, light-headedness, numbness and headaches.  Psychiatric/Behavioral:  Negative for confusion and decreased concentration.   All other systems reviewed and are negative.  Physical Exam Updated Vital Signs BP (!) 145/56   Pulse 83   Temp 97.6 F (36.4 C) (Oral)   Resp 18   Ht 4\' 10"  (1.473 m)   Wt 85 kg   SpO2 100%   BMI 39.16 kg/m   Physical Exam Vitals and nursing note reviewed.  Constitutional:      General: She is not in acute distress.    Appearance: Normal appearance. She is well-developed. She is obese. She is not ill-appearing, toxic-appearing or diaphoretic.  HENT:     Head: Normocephalic and atraumatic.  Cardiovascular:     Rate and Rhythm: Normal rate and regular rhythm.     Pulses: Normal pulses.          Radial pulses are 2+ on the right side and 2+ on the left side.     Heart sounds: Normal heart sounds.  Pulmonary:     Effort: Pulmonary effort is normal. No tachypnea or respiratory distress.     Breath sounds: Normal breath sounds. No decreased breath sounds, wheezing, rhonchi or rales.  Chest:      Chest wall: No mass, deformity, tenderness, crepitus or edema.  Abdominal:     General: Bowel sounds are normal.     Palpations: Abdomen  is soft.  Musculoskeletal:        General: Normal range of motion.     Cervical back: Normal range of motion and neck supple.     Right lower leg: Tenderness present. No edema.     Left lower leg: No tenderness. No edema.     Comments: Right posterior calf and popliteal fossa tenderness noted without erythema, swelling, or warmth. Positive Homans sign  Skin:    General: Skin is warm and dry.  Neurological:     General: No focal deficit present.     Mental Status: She is alert.  Psychiatric:        Mood and Affect: Mood normal.        Behavior: Behavior normal.    ED Results / Procedures / Treatments   Labs (all labs ordered are listed, but only abnormal results are displayed) Labs Reviewed  BASIC METABOLIC PANEL - Abnormal; Notable for the following components:      Result Value   BUN 24 (*)    All other components within normal limits  RESP PANEL BY RT-PCR (FLU A&B, COVID) ARPGX2  CBC  TSH  D-DIMER, QUANTITATIVE  TROPONIN I (HIGH SENSITIVITY)  TROPONIN I (HIGH SENSITIVITY)    EKG EKG Interpretation  Date/Time:  Friday January 09 2021 20:04:46 EDT Ventricular Rate:  91 PR Interval:  142 QRS Duration: 78 QT Interval:  348 QTC Calculation: 428 R Axis:   71 Text Interpretation: Normal sinus rhythm Normal ECG No acute changes Confirmed by Addison Lank [phone]) on 01/09/2021 11:28:04 PM  Radiology DG Chest 1 View  Result Date: 01/09/2021 CLINICAL DATA:  Intermittent central chest pain and upper back pain with LEFT arm numbness since last month. History coronary artery disease, hypertension EXAM: CHEST  1 VIEW COMPARISON:  04/03/2018 FINDINGS: Normal heart size, mediastinal contours, and pulmonary vascularity. Lungs clear. No pulmonary infiltrate, pleural effusion, or pneumothorax. Questionable nodular density 8 mm diameter at RIGHT base  versus prominent vascular marking. Bones demineralized with levoconvex thoracic scoliosis. IMPRESSION: Question 8 mm RIGHT base lung nodule versus prominent vascular marking; CT chest recommended to exclude pulmonary nodule. Electronically Signed   By: Lavonia Dana M.D.   On: 01/09/2021 21:14    Procedures Procedures   Medications Ordered in ED Medications - No data to display  ED Course  I have reviewed the triage vital signs and the nursing notes.  Pertinent labs & imaging results that were available during my care of the patient were reviewed by me and considered in my medical decision making (see chart for details).    MDM Rules/Calculators/A&P                         Given the large differential diagnosis for [name], the decision making in this case is of high complexity.  After evaluating all of the data points in this case, the presentation of Liesl Simons is NOT consistent with Acute Coronary Syndrome (ACS) and/or myocardial ischemia, pulmonary embolism, aortic dissection; significant arrythmia, pneumothorax, cardiac tamponade, or other emergent cardiopulmonary condition.  Further, the presentation of Jacoria Keiffer is NOT consistent with pericarditis, myocarditis, endocarditis, or new valvular disease.  Additionally, the presentation of Victoriana Aziz NOT consistent with flail chest, cardiac contusion, ARDS, or significant intra-thoracic or intra-abdominal bleeding.  Moreover, this presentation is NOT consistent with pneumonia or sepsis  Patient presents today with intermittent chest pain and what she feels to be tachycardia which has  been increasing in frequency over the past few months. Holter monitor picked up 1 four beat run of SVT but was otherwise unremarkable for arrhythmia. Pain is not exertional and seems to have differing etiology with each episode. No associated, diaphoresis or nausea/vomiting. Last episode was this morning at approximately  6 am, was sharp in nature and lasted approximately 5 minutes before resolving. Labs unremarkable, no leukocytosis, troponins flat, d dimer negative. Chest x-ray reveals 8 mm lung nodule with recommended CT chest for further evaluation. Low suspicion for emergent etiology related to this or that this is the cause of the patients chest pain, likely incidental finding. No EKG changes. HEART score is 3. Patient is in the process of getting established care with a cardiologist, feel that patients symptoms can be adequately managed on an outpatient basis at this time. Patient is amenable with this plan, educated on red flag symptoms that would prompt immediate return.   Strict return and follow-up precautions have been given by me personally or by detailed written instruction given verbally by nursing staff using the teach back method to the patient/family/caregiver(s).  Data Reviewed/Counseling: I have reviewed the patient's vital signs, nursing notes, and other relevant tests/information. I had a detailed discussion regarding the historical points, exam findings, and any diagnostic results supporting the discharge diagnosis. I also discussed the need for outpatient follow-up and the need to return to the ED if symptoms worsen or if there are any questions or concerns that arise at home.  Findings and plan of care discussed with supervising physician Dr. Armandina Gemma who is in agreement.     Final Clinical Impression(s) / ED Diagnoses Final diagnoses:  Nonspecific chest pain    Rx / DC Orders ED Discharge Orders     None     An After Visit Summary was printed and given to the patient.    Nestor Lewandowsky 01/10/21 1433    Regan Lemming, MD 01/10/21 2007

## 2021-01-10 NOTE — ED Notes (Signed)
Pt verbalizes understanding of discharge instructions. Opportunity for questions and answers were provided. Pt discharged from the ED.   ?

## 2021-01-12 ENCOUNTER — Ambulatory Visit
Admission: RE | Admit: 2021-01-12 | Discharge: 2021-01-12 | Disposition: A | Discharge status: Home / Self Care | Payer: Medicare PPO | Source: Ambulatory Visit | Attending: Family Medicine | Admitting: Family Medicine

## 2021-01-12 DIAGNOSIS — R002 Palpitations: Secondary | ICD-10-CM

## 2021-01-12 DIAGNOSIS — R079 Chest pain, unspecified: Secondary | ICD-10-CM

## 2021-01-12 DIAGNOSIS — R000 Tachycardia, unspecified: Secondary | ICD-10-CM

## 2021-01-12 DIAGNOSIS — R0789 Other chest pain: Secondary | ICD-10-CM

## 2021-01-12 MED ORDER — IOPAMIDOL (ISOVUE-370) INJECTION 76%
75.0000 mL | Freq: Once | INTRAVENOUS | Status: AC | PRN
  Administered 2021-01-12: 75 mL via INTRAVENOUS

## 2021-02-17 ENCOUNTER — Encounter: Payer: Self-pay | Admitting: Cardiology

## 2021-02-17 ENCOUNTER — Ambulatory Visit: Payer: Medicare PPO

## 2021-02-17 ENCOUNTER — Ambulatory Visit: Payer: Medicare PPO | Admitting: Cardiology

## 2021-02-17 ENCOUNTER — Other Ambulatory Visit: Payer: Self-pay

## 2021-02-17 VITALS — BP 130/86 | HR 83 | Ht <= 58 in | Wt 170.0 lb

## 2021-02-17 DIAGNOSIS — R002 Palpitations: Secondary | ICD-10-CM

## 2021-02-17 DIAGNOSIS — I201 Angina pectoris with documented spasm: Secondary | ICD-10-CM

## 2021-02-17 DIAGNOSIS — R0789 Other chest pain: Secondary | ICD-10-CM

## 2021-02-17 DIAGNOSIS — E669 Obesity, unspecified: Secondary | ICD-10-CM | POA: Diagnosis not present

## 2021-02-17 MED ORDER — METOPROLOL TARTRATE 100 MG PO TABS: ORAL_TABLET | ORAL | 0 refills | Status: DC

## 2021-02-17 MED ORDER — DILTIAZEM HCL 30 MG PO TABS: 30.0000 mg | ORAL_TABLET | Freq: Three times a day (TID) | ORAL | 3 refills | Status: DC | PRN

## 2021-02-17 NOTE — Progress Notes (Signed)
Cardiology Office Note:    Date:  02/17/2021   ID:  [name] [name], DOB [birth year], MRN [id number]  PCP:  Margretta Sidle, MD  Cardiologist:  Berniece Salines, DO  Electrophysiologist:  None   Referring MD: Margretta Sidle, MD   "I am having chest pain"  History of Present Illness:    [name] [name] is a 70 y.o. female with a hx of coronary spasm which was diagnosed during a heart catheterization back in 1999 or early 2000 patient tells me, hyperlipidemia, obesity is here today to establish cardiac care.  The patient tells me that she has been experiencing intermittent chest discomfort.  She describes this as a midsternal chest sharpness which at times radiates to her arm and to her back.  It is intermittent.  Lasts about few minutes and then resolved.  She is unable to really quantify how many minutes this lasts.  She tells me at first the frequency was less but now she has had frequent episodes then she can recall.  She also reports that she has been experiencing intermittent palpitations.  She described it as an abrupt onset of fast heartbeat which last for minutes at a time.  She denies any lightheadedness or dizziness.  But her symptoms are frequent.  This has been going on for years.   Past Medical History:  Diagnosis Date   Allergy    Anemia    IN THE PAST WITH PREGNANCY   Arthritis    LEFT FOOT; OCCAS LOWER BACK PAIN - HX OF VERTEBRAL FRACTURES   Blood transfusion without reported diagnosis    DURING PREGNANCY   Cataract    removed both eyes    Complication of anesthesia    a little sluggist when waking up-no intervention required   Constipation    uses Colace 3 x a day and Dulcolax q week- with this has soft BM's- no pain    Coronary artery spasm (HCC) 1990'S   AFTER OTC DIET PILLS - PT EXPERIENCED CHEST PAIN- PT HAD CARDIAC CATH - TOLD NO CAD - THOUGHT TO HAVE HAS CORNARY ARTERY SPASM   GERD (gastroesophageal reflux disease)    mild    H/O seasonal allergies     Headache(784.0)    OCCAS SINUS HEADACHES   Hearing impaired    LEFT EAR DEAFNESS   Hypertension    220/104 IN SPRING 2015 - SAW HER DOCTOR - BUT B/P NORMALIZED - PT DECIDED TO STOP CAFFEINE AND HAS NOT HAD ANY ELEVATED PRESSURES SINCE JUNE   Osteopenia    Strabismus    HAD STRABISMUS SURGERY 1956 - BUT STILL HAS STRABISMUS   Vitamin D deficiency     Past Surgical History:  Procedure Laterality Date   ABDOMINAL HYSTERECTOMY     BONE ANCHORED HEARING AID IMPLANT Left 01/26/2019   Procedure: BONE ANCHORED HEARING AID (BAHA) IMPLANT;  Surgeon: [name], MD;  Location: [place];  Service: ENT;  Laterality: Left;   BREATH TEK H PYLORI N/A 10/08/2013   Procedure: BREATH TEK H PYLORI;  Surgeon: Gayland Curry, MD;  Location: [facility];  Service: General;  Laterality: N/A;   BREATH TEK H PYLORI N/A 12/03/2013   Procedure: Lauris Chroman;  Surgeon: Gayland Curry, MD;  Location: [facility];  Service: General;  Laterality: N/A;   CHOLECYSTECTOMY N/A 04/13/2016   Procedure: LAPAROSCOPIC CHOLECYSTECTOMY WITH ATTEMPTED INTRAOPERATIVE CHOLANGIOGRAM;  Surgeon: Greer Pickerel, MD;  Location: [place] [place];  Service: General;  Laterality: N/A;   COLONOSCOPY  EYE SURGERY Bilateral 2017   cataract surgery with lens implant   INNER EAR SURGERY Left [place] GASTRIC SLEEVE RESECTION N/A 12/18/2013   Procedure: LAPAROSCOPIC GASTRIC SLEEVE RESECTION;  Surgeon: Greer Pickerel, MD;  Location: [facility];  Service: General;  Laterality: N/A;   REPAIR TENDONS FOOT Right 1994   AND RECONSTRUCTIVE SURGERY FOR MULTIPLE FRACTURES OF THE RIGHT FOOT   STOMACH SURGERY     strabismus repair     TONSILLECTOMY     UPPER GASTROINTESTINAL ENDOSCOPY      Current Medications: Current Meds  Medication Sig   amitriptyline (ELAVIL) 10 MG tablet Take 20 mg by mouth daily.   bisacodyl (DULCOLAX) 5 MG EC tablet Take 5 mg by mouth once a week.    Calcium 600-200 MG-UNIT tablet Take 1 tablet by mouth  daily.   diclofenac Sodium (VOLTAREN) 1 % GEL Apply 2 g topically 4 (four) times daily as needed (dry skin, rash).   diltiazem (CARDIZEM) 30 MG tablet Take 1 tablet (30 mg total) by mouth every 8 (eight) hours as needed (if heart rate is greater than 700 and if systolic blood pressure (top number) is greater than 110 mmHg).   docusate sodium (COLACE) 100 MG capsule Take 100-200 mg by mouth See admin instructions. Take 200 mg in the Morning and 100 mg in the evening   FERROCITE 324 MG TABS tablet Take 324 mg of iron by mouth every Monday, Wednesday, and Friday.   fluticasone (FLONASE) 50 MCG/ACT nasal spray Place 2 sprays into both nostrils as needed.   meloxicam (MOBIC) 15 MG tablet TAKE 1 TABLET BY MOUTH EVERY DAY (Patient taking differently: Take 15 mg by mouth daily as needed for pain.)   metoprolol tartrate (LOPRESSOR) 100 MG tablet Take 2 hours prior to CT   nystatin (MYCOSTATIN/NYSTOP) powder Apply topically 2 (two) times daily. (Patient taking differently: Apply 1 application topically 2 (two) times daily as needed (infection).)   Polyethyl Glycol-Propyl Glycol (SYSTANE) 0.4-0.3 % SOLN Apply 1 drop to eye daily.   vitamin B-12 (CYANOCOBALAMIN) 500 MCG tablet Take 500 mcg by mouth daily.   Vitamin D, Cholecalciferol, 1000 units TABS Take 1,000 mcg by mouth daily.      Allergies:   Augmentin [amoxicillin-pot clavulanate] and Morphine and related   Social History   Socioeconomic History   Marital status: Married    Spouse name: Doug   Number of children: 2   Years of education: master's degree   Highest education level: Not on file  Occupational History   Occupation: Programmer, multimedia: [employer]  Tobacco Use   Smoking status: Never   Smokeless tobacco: Never  Vaping Use   Vaping Use: Never used  Substance and Sexual Activity   Alcohol use: No    Alcohol/week: 0.0 standard drinks   Drug use: No   Sexual activity: Yes    Birth control/protection: Surgical  Other Topics Concern    Not on file  Social History Narrative   05/28/19   From: the area x 25 years, originally from [place]: with husband Marden Noble, and youngest son   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      Family: Adam (married, 2 children) and Shanon Brow (living at home) - adult      Enjoys: read, cook, travel, working on projects -  organizing, painting, spending time with grandkids      Exercise: getting back to exercise, trying to treadmill    Diet: meals are healthy, but getting carb cravings      Safety   Seat belts: Yes    Guns: No   Safe in relationships: Yes    Social Determinants of Radio broadcast assistant Strain: Not on file  Food Insecurity: Not on file  Transportation Needs: Not on file  Physical Activity: Not on file  Stress: Not on file  Social Connections: Not on file     Family History: The patient's family history includes Alcohol abuse in her father; COPD in her maternal grandmother and paternal grandfather; Colon cancer (age of onset: 43) in her brother; Colon polyps in her sister; Diabetes in her mother; Heart disease in her mother; Hyperlipidemia in her maternal grandmother; Hypertension in her mother; Obesity in her mother. There is no history of Stomach cancer, Rectal cancer, Esophageal cancer, or Pancreatic cancer.  ROS:   Review of Systems  Constitution: Negative for decreased appetite, fever and weight gain.  HENT: Negative for congestion, ear discharge, hoarse voice and sore throat.   Eyes: Negative for discharge, redness, vision loss in right eye and visual halos.  Cardiovascular: Negative for chest pain, dyspnea on exertion, leg swelling, orthopnea and palpitations.  Respiratory: Negative for cough, hemoptysis, shortness of breath and snoring.   Endocrine: Negative for heat intolerance and polyphagia.  Hematologic/Lymphatic: Negative for  bleeding problem. Does not bruise/bleed easily.  Skin: Negative for flushing, nail changes, rash and suspicious lesions.  Musculoskeletal: Negative for arthritis, joint pain, muscle cramps, myalgias, neck pain and stiffness.  Gastrointestinal: Negative for abdominal pain, bowel incontinence, diarrhea and excessive appetite.  Genitourinary: Negative for decreased libido, genital sores and incomplete emptying.  Neurological: Negative for brief paralysis, focal weakness, headaches and loss of balance.  Psychiatric/Behavioral: Negative for altered mental status, depression and suicidal ideas.  Allergic/Immunologic: Negative for HIV exposure and persistent infections.    EKGs/Labs/Other Studies Reviewed:    The following studies were reviewed today:   EKG:  The ekg ordered today demonstrates sinus rhythm, heart rate 85 bpm.  Recent Labs: 01/09/2021: BUN 24; Creatinine, Ser 0.84; Hemoglobin 12.5; Platelets 355; Potassium 3.8; Sodium 139; TSH 2.573  Recent Lipid Panel    Component Value Date/Time   CHOL 182 02/18/2020 1147   TRIG 129.0 02/18/2020 1147   HDL 65.40 02/18/2020 1147   CHOLHDL 3 02/18/2020 1147   VLDL 25.8 02/18/2020 1147   LDLCALC 91 02/18/2020 1147   LDLDIRECT 134.4 04/13/2011 0911    Physical Exam:    VS:  BP 130/86   Pulse 83   Ht 4\' 10"  (1.473 m)   Wt 170 lb (77.1 kg)   SpO2 95%   BMI 35.53 kg/m     Wt Readings from Last 3 Encounters:  02/17/21 170 lb (77.1 kg)  01/09/21 187 lb 6.3 oz (85 kg)  02/18/20 171 lb 8 oz (77.8 kg)     GEN: Well nourished, well developed in no acute distress HEENT: Normal NECK: No JVD; No carotid bruits LYMPHATICS: No lymphadenopathy CARDIAC: S1S2 noted,RRR, no murmurs, rubs, gallops RESPIRATORY:  Clear to auscultation without rales, wheezing or rhonchi  ABDOMEN: Soft, non-tender, non-distended, +bowel sounds, no guarding. EXTREMITIES: No edema, No cyanosis, no clubbing MUSCULOSKELETAL:  No deformity  SKIN: Warm and  dry NEUROLOGIC:  Alert and oriented x 3, non-focal PSYCHIATRIC:  Normal affect, good insight  ASSESSMENT:    1. Other  chest pain   2. Palpitations   3. History of coronary spasm (HCC)   4. Obesity (BMI 30-39.9)    PLAN:    The symptoms chest pain is concerning, this patient does have intermediate risk for coronary artery disease and at this time I would like to pursue an ischemic evaluation in this patient.  Shared decision a coronary CTA at this time is appropriate.  I have discussed with the patient about the testing.  The patient has no IV contrast allergy and is agreeable to proceed with this test.  She has had a ZIO monitor which she wore for 11 days.  The study was essentially normal.  Says she still is experiencing palpitations and in the setting of her history of coronary spasm start the patient on as needed calcium channel blocker I have educated patient as long as her heart rate is greater than 762 and systolic blood pressures greater than 110 is okay to take 30 mg of her Cardizem.  I reviewed her lipid profile which was done in November 2021.  She tells me she had recent blood work with her PCP were going to request this for my review.  The patient understands the need to lose weight with diet and exercise. We have discussed specific strategies for this.  The patient is in agreement with the above plan. The patient left the office in stable condition.  The patient will follow up in 6 months or sooner if needed.   Medication Adjustments/Labs and Tests Ordered: Current medicines are reviewed at length with the patient today.  Concerns regarding medicines are outlined above.  Orders Placed This Encounter  Procedures   CT CORONARY MORPH W/CTA COR W/SCORE W/CA W/CM &/OR WO/CM   Basic Metabolic Panel (BMET)   Magnesium   EKG 12-Lead   Meds ordered this encounter  Medications   metoprolol tartrate (LOPRESSOR) 100 MG tablet    Sig: Take 2 hours prior to CT    Dispense:  1  tablet    Refill:  0   diltiazem (CARDIZEM) 30 MG tablet    Sig: Take 1 tablet (30 mg total) by mouth every 8 (eight) hours as needed (if heart rate is greater than 831 and if systolic blood pressure (top number) is greater than 110 mmHg).    Dispense:  90 tablet    Refill:  3    Patient Instructions  Medication Instructions:  Your physician has recommended you make the following change in your medication:  TODAY: Cardizem 30 mg every 8 hours if heart rate is greater than 517 and if systolic blood pressure (top number) is greater than 110 mmHg  *If you need a refill on your cardiac medications before your next appointment, please call your pharmacy*   Lab Work: Your physician recommends that you return for lab work in:  TODAY: BMET, [place] If you have labs (blood work) drawn today and your tests are completely normal, you will receive your results only by: [place] (if you have [place]) OR A paper copy in the mail If you have any lab test that is abnormal or we need to change your treatment, we will call you to review the results.   Testing/Procedures:   Your cardiac CT will be scheduled at one of the below locations:   [facility] [address] [place], [place] [address] [phone]  If scheduled at [facility], please arrive at the [facility] main entrance (entrance A) of [facility]  30 minutes prior to test start time. You can use the FREE valet parking offered at the main entrance (encouraged to control the heart rate for the test) Proceed to the [facility] Radiology Department (first floor) to check-in and test prep.   Please follow these instructions carefully (unless otherwise directed):   On the Night Before the Test: Be sure to Drink plenty of water. Do not consume any caffeinated/decaffeinated beverages or chocolate 12 hours prior to your test. Do not take any antihistamines 12 hours prior to your test.   On the Day of the  Test: Drink plenty of water until 1 hour prior to the test. Do not eat any food 4 hours prior to the test. You may take your regular medications prior to the test.  Take metoprolol (Lopressor) two hours prior to test. HOLD Furosemide/Hydrochlorothiazide morning of the test. Please avoid using Cardizem (Diltiazem) the day of the scan FEMALES- please wear underwire-free bra if available, avoid dresses & tight clothing      After the Test: Drink plenty of water. After receiving IV contrast, you may experience a mild flushed feeling. This is normal. On occasion, you may experience a mild rash up to 24 hours after the test. This is not dangerous. If this occurs, you can take Benadryl 25 mg and increase your fluid intake. If you experience trouble breathing, this can be serious. If it is severe call 911 IMMEDIATELY. If it is mild, please call our office. If you take any of these medications: Glipizide/Metformin, Avandament, Glucavance, please do not take 48 hours after completing test unless otherwise instructed.  Please allow 2-4 weeks for scheduling of routine cardiac CTs. Some insurance companies require a pre-authorization which may delay scheduling of this test.   For non-scheduling related questions, please contact the cardiac imaging nurse navigator should you have any questions/concerns: Marchia Bond, Cardiac Imaging Nurse Navigator Gordy Clement, Cardiac Imaging Nurse Navigator [facility] Direct Office Dial: [phone]   For scheduling needs, including cancellations and rescheduling, please call [place], [phone].    Follow-Up: At [facility], you and your health needs are our priority.  As part of our continuing mission to provide you with exceptional heart care, we have created designated Provider Care Teams.  These Care Teams include your primary Cardiologist (physician) and Advanced Practice Providers (APPs -  Physician Assistants and Nurse  Practitioners) who all work together to provide you with the care you need, when you need it.  We recommend signing up for the patient portal called "MyChart".  Sign up information is provided on this After Visit Summary.  MyChart is used to connect with patients for Virtual Visits (Telemedicine).  Patients are able to view lab/test results, encounter notes, upcoming appointments, etc.  Non-urgent messages can be sent to your provider as well.   To learn more about what you can do with MyChart, go to [email or web address].    Your next appointment:   6 month(s)  The format for your next appointment:   In Person  Provider:   Berniece Salines, Do   Other Instructions     Adopting a Healthy Lifestyle.  Know what a healthy weight is for you (roughly BMI <25) and aim to maintain this   Aim for 7+ servings of fruits and vegetables daily   65-80+ fluid ounces of water or unsweet tea for healthy kidneys   Limit to max 1 drink of alcohol per day; avoid smoking/tobacco   Limit animal fats in diet  for cholesterol and heart health - choose grass fed whenever available   Avoid highly processed foods, and foods high in saturated/trans fats   Aim for low stress - take time to unwind and care for your mental health   Aim for 150 min of moderate intensity exercise weekly for heart health, and weights twice weekly for bone health   Aim for 7-9 hours of sleep daily   When it comes to diets, agreement about the perfect plan isnt easy to find, even among the experts. Experts at the [employer] developed an idea known as the Healthy Eating Plate. Just imagine a plate divided into logical, healthy portions.   The emphasis is on diet quality:   Load up on vegetables and fruits - one-half of your plate: Aim for color and variety, and remember that potatoes dont count.   Go for whole grains - one-quarter of your plate: Whole wheat, barley, wheat berries, quinoa, oats, brown  rice, and foods made with them. If you want pasta, go with whole wheat pasta.   Protein power - one-quarter of your plate: Fish, chicken, beans, and nuts are all healthy, versatile protein sources. Limit red meat.   The diet, however, does go beyond the plate, offering a few other suggestions.   Use healthy plant oils, such as olive, canola, soy, corn, sunflower and peanut. Check the labels, and avoid partially hydrogenated oil, which have unhealthy trans fats.   If youre thirsty, drink water. Coffee and tea are good in moderation, but skip sugary drinks and limit milk and dairy products to one or two daily servings.   The type of carbohydrate in the diet is more important than the amount. Some sources of carbohydrates, such as vegetables, fruits, whole grains, and beans-are healthier than others.   Finally, stay active  Signed, Berniece Salines, DO  02/17/2021 2:07 PM    [place]

## 2021-02-17 NOTE — Patient Instructions (Addendum)
Medication Instructions:  Your physician has recommended you make the following change in your medication:  TODAY: Cardizem 30 mg every 8 hours if heart rate is greater than 403 and if systolic blood pressure (top number) is greater than 110 mmHg  *If you need a refill on your cardiac medications before your next appointment, please call your pharmacy*   Lab Work: Your physician recommends that you return for lab work in:  TODAY: BMET, [place] If you have labs (blood work) drawn today and your tests are completely normal, you will receive your results only by: [place] (if you have [place]) OR A paper copy in the mail If you have any lab test that is abnormal or we need to change your treatment, we will call you to review the results.   Testing/Procedures:   Your cardiac CT will be scheduled at one of the below locations:   [facility] [address] [place], [place] [address] [phone]  If scheduled at [facility], please arrive at the [facility] main entrance (entrance A) of [facility] 30 minutes prior to test start time. You can use the FREE valet parking offered at the main entrance (encouraged to control the heart rate for the test) Proceed to the [facility] Radiology Department (first floor) to check-in and test prep.   Please follow these instructions carefully (unless otherwise directed):   On the Night Before the Test: Be sure to Drink plenty of water. Do not consume any caffeinated/decaffeinated beverages or chocolate 12 hours prior to your test. Do not take any antihistamines 12 hours prior to your test.   On the Day of the Test: Drink plenty of water until 1 hour prior to the test. Do not eat any food 4 hours prior to the test. You may take your regular medications prior to the test.  Take metoprolol (Lopressor) two hours prior to test. HOLD Furosemide/Hydrochlorothiazide morning of the test. Please avoid using Cardizem  (Diltiazem) the day of the scan  FEMALES- please wear underwire-free bra if available, avoid dresses & tight clothing      After the Test: Drink plenty of water. After receiving IV contrast, you may experience a mild flushed feeling. This is normal. On occasion, you may experience a mild rash up to 24 hours after the test. This is not dangerous. If this occurs, you can take Benadryl 25 mg and increase your fluid intake. If you experience trouble breathing, this can be serious. If it is severe call 911 IMMEDIATELY. If it is mild, please call our office. If you take any of these medications: Glipizide/Metformin, Avandament, Glucavance, please do not take 48 hours after completing test unless otherwise instructed.  Please allow 2-4 weeks for scheduling of routine cardiac CTs. Some insurance companies require a pre-authorization which may delay scheduling of this test.   For non-scheduling related questions, please contact the cardiac imaging nurse navigator should you have any questions/concerns: Marchia Bond, Cardiac Imaging Nurse Navigator Gordy Clement, Cardiac Imaging Nurse Navigator [facility] [facility] Direct Office Dial: [phone]   For scheduling needs, including cancellations and rescheduling, please call [place], [phone].    Follow-Up: At [facility], you and your health needs are our priority.  As part of our continuing mission to provide you with exceptional heart care, we have created designated Provider Care Teams.  These Care Teams include your primary Cardiologist (physician) and Advanced Practice Providers (APPs -  Physician Assistants and Nurse Practitioners) who all  work together to provide you with the care you need, when you need it.  We recommend signing up for the patient portal called "MyChart".  Sign up information is provided on this After Visit Summary.  MyChart is used to connect with patients for Virtual Visits (Telemedicine).   Patients are able to view lab/test results, encounter notes, upcoming appointments, etc.  Non-urgent messages can be sent to your provider as well.   To learn more about what you can do with MyChart, go to [email or web address].    Your next appointment:   6 month(s)  The format for your next appointment:   In Person  Provider:   Berniece Salines, Do   Other Instructions

## 2021-02-18 LAB — BASIC METABOLIC PANEL
BUN/Creatinine Ratio: 35 — ABNORMAL HIGH (ref 12–28)
BUN: 22 mg/dL (ref 8–27)
CO2: 24 mmol/L (ref 20–29)
Calcium: 9.2 mg/dL (ref 8.7–10.3)
Chloride: 102 mmol/L (ref 96–106)
Creatinine, Ser: 0.62 mg/dL (ref 0.57–1.00)
Glucose: 84 mg/dL (ref 70–99)
Potassium: 4.1 mmol/L (ref 3.5–5.2)
Sodium: 142 mmol/L (ref 134–144)
eGFR: 97 mL/min/{1.73_m2} (ref >59)

## 2021-02-18 LAB — MAGNESIUM: Magnesium: 2.2 mg/dL (ref 1.6–2.3)

## 2021-03-02 ENCOUNTER — Telehealth (HOSPITAL_COMMUNITY): Payer: Self-pay | Admitting: *Deleted

## 2021-03-02 NOTE — Telephone Encounter (Signed)
Reaching out to patient to offer assistance regarding upcoming cardiac imaging study; pt verbalizes understanding of appt date/time, parking situation and where to check in, pre-test NPO status and medications ordered, and verified current allergies; name and call back number provided for further questions should they arise ° °[name] Capili RN Navigator Cardiac Imaging °[facility] °[phone] office °[phone] cell ° °Patient to take 100mg metoprolol tartrate two hours prior to cardiac CT scan. She is aware to arrive at 11:30am for her noon scan. °

## 2021-03-03 ENCOUNTER — Ambulatory Visit (HOSPITAL_COMMUNITY)
Admission: RE | Admit: 2021-03-03 | Discharge: 2021-03-03 | Disposition: A | Discharge status: Home / Self Care | Payer: Medicare PPO | Source: Ambulatory Visit | Attending: Cardiology | Admitting: Cardiology

## 2021-03-03 ENCOUNTER — Other Ambulatory Visit: Payer: Self-pay

## 2021-03-03 ENCOUNTER — Encounter (HOSPITAL_COMMUNITY): Payer: Self-pay

## 2021-03-03 DIAGNOSIS — R072 Precordial pain: Secondary | ICD-10-CM | POA: Diagnosis present

## 2021-03-03 DIAGNOSIS — K449 Diaphragmatic hernia without obstruction or gangrene: Secondary | ICD-10-CM | POA: Diagnosis not present

## 2021-03-03 DIAGNOSIS — R0789 Other chest pain: Secondary | ICD-10-CM

## 2021-03-03 MED ORDER — NITROGLYCERIN 0.4 MG SL SUBL
0.8000 mg | SUBLINGUAL_TABLET | Freq: Once | SUBLINGUAL | Status: AC
  Administered 2021-03-03: 0.8 mg via SUBLINGUAL

## 2021-03-03 MED ORDER — IOHEXOL 350 MG/ML SOLN
95.0000 mL | Freq: Once | INTRAVENOUS | Status: AC | PRN
  Administered 2021-03-03: 95 mL via INTRAVENOUS

## 2021-04-16 ENCOUNTER — Other Ambulatory Visit: Payer: Self-pay | Admitting: Family Medicine

## 2021-04-16 DIAGNOSIS — M25561 Pain in right knee: Secondary | ICD-10-CM

## 2021-04-22 ENCOUNTER — Other Ambulatory Visit: Payer: Self-pay

## 2021-04-22 ENCOUNTER — Ambulatory Visit
Admission: RE | Admit: 2021-04-22 | Discharge: 2021-04-22 | Disposition: A | Discharge status: Home / Self Care | Payer: Medicare PPO | Source: Ambulatory Visit | Attending: Family Medicine | Admitting: Family Medicine

## 2021-04-22 DIAGNOSIS — M25561 Pain in right knee: Secondary | ICD-10-CM

## 2021-05-11 ENCOUNTER — Other Ambulatory Visit: Payer: Self-pay

## 2021-05-11 ENCOUNTER — Encounter: Payer: Self-pay | Admitting: Orthopedic Surgery

## 2021-05-11 ENCOUNTER — Ambulatory Visit: Payer: Medicare PPO | Admitting: Orthopedic Surgery

## 2021-05-11 DIAGNOSIS — M25561 Pain in right knee: Secondary | ICD-10-CM | POA: Diagnosis not present

## 2021-05-11 DIAGNOSIS — M79674 Pain in right toe(s): Secondary | ICD-10-CM | POA: Diagnosis not present

## 2021-05-12 ENCOUNTER — Encounter: Payer: Self-pay | Admitting: Orthopedic Surgery

## 2021-05-12 NOTE — Progress Notes (Signed)
Office Visit Note   Patient: [name] [name]           Date of Birth: 1951-01-06           MRN: [id number] Visit Date: 05/11/2021              Requested by: Margretta Sidle, [address],  [place] [id number] PCP: Margretta Sidle, MD  Chief Complaint  Patient presents with   Right Knee - Pain   Right Foot - Pain      HPI: Patient is a 71 year old woman who presents in follow-up for right knee pain and right great toe pain.  She states she has been taking meloxicam but this causes GI upset.  She feels like the right foot is worse keeps her up at night and affects her balance.  Patient states that the great toe pain is causing ankle symptoms as well.  Assessment & Plan: Visit Diagnoses:  1. Right knee pain, unspecified chronicity   2. Great toe pain, right     Plan: Recommended stiff soled shoe with orthotic such as sole to unload the great toe MTP joint.  Discussed that if this does not work a MTP fusion is an option.  Follow-Up Instructions: Return if symptoms worsen or fail to improve.   Ortho Exam  Patient is alert, oriented, no adenopathy, well-dressed, normal affect, normal respiratory effort. Examination patient has bony spurs dorsally over the right great toe MTP joint she only has dorsiflexion about 20 degrees and this reproduces her symptoms.  Examination of the right knee she is tender to palpation over the lateral patellofemoral joint.  There is no effusion collaterals and cruciates are stable.  Review of her MRI scan shows partial-thickness cartilage loss of the patella with mild tendinosis of the patella tendon.  There is no meniscal or ligamentous injury.  Imaging: No results found. No images are attached to the encounter.  Labs: Lab Results  Component Value Date   LABURIC 4.3 09/30/2016   REPTSTATUS 10/17/2016 FINAL 10/15/2016   CULT MULTIPLE SPECIES PRESENT, SUGGEST RECOLLECTION (A) 10/15/2016   LABORGA ESCHERICHIA COLI 11/25/2015      Lab Results  Component Value Date   ALBUMIN 4.2 02/18/2020   ALBUMIN 4.7 11/24/2017   ALBUMIN 4.2 10/13/2017    Lab Results  Component Value Date   MG 2.2 02/17/2021   Lab Results  Component Value Date   VD25OH 52 09/15/2018   VD25OH 26.06 (L) 10/13/2017   VD25OH 27.2 (L) 04/09/2016    No results found for: PREALBUMIN CBC EXTENDED Latest Ref Rng & Units 01/09/2021 02/18/2020 01/18/2019  WBC 4.0 - 10.5 K/uL 9.8 9.3 10.8(H)  RBC 3.87 - 5.11 MIL/uL 3.97 4.14 4.11  HGB 12.0 - 15.0 g/dL 12.5 13.3 13.2  HCT 36.0 - 46.0 % 39.0 39.5 40.6  PLT 150 - 400 K/uL 355 317.0 333  NEUTROABS 1,500 - 7,800 cells/uL - - -  LYMPHSABS 850 - 3,900 cells/uL - - -     There is no height or weight on file to calculate BMI.  Orders:  No orders of the defined types were placed in this encounter.  No orders of the defined types were placed in this encounter.    Procedures: No procedures performed  Clinical Data: No additional findings.  ROS:  All other systems negative, except as noted in the HPI. Review of Systems  Objective: Vital Signs: There were no vitals taken for this visit.  Specialty Comments:  No specialty comments  available.  PMFS History: Patient Active Problem List   Diagnosis Date Noted   Other chest pain 02/17/2021   Palpitations 02/17/2021   History of coronary spasm ([place]) 02/17/2021   Obesity (BMI 30-39.9) 02/17/2021   Balance problems 02/18/2020   Mixed hyperlipidemia 05/28/2019   Left ear pain 02/15/2019   Arthralgia 08/30/2018   Myalgia 08/29/2018   Vitamin D deficiency 08/29/2018   Osteoporosis 06/14/2018   Closed fracture of one rib of left side 04/03/2018   Atypical lymphocytes present on peripheral blood smear 06/02/2016   Family history of colon cancer-brother at age 64 05/27/2016   Conductive hearing loss, bilateral 05/04/2016   S/P laparoscopic sleeve gastrectomy 12/18/2013   Persistent disorder of initiating or maintaining sleep  11/05/2013   Disorder of bone and cartilage 10/17/2009   Past Medical History:  Diagnosis Date   Allergy    Anemia    IN THE PAST WITH PREGNANCY   Arthritis    LEFT FOOT; OCCAS LOWER BACK PAIN - HX OF VERTEBRAL FRACTURES   Blood transfusion without reported diagnosis    DURING PREGNANCY   Cataract    removed both eyes    Complication of anesthesia    a little sluggist when waking up-no intervention required   Constipation    uses Colace 3 x a day and Dulcolax q week- with this has soft BM's- no pain    Coronary artery spasm ([facility]) 1990'S   AFTER OTC DIET PILLS - PT EXPERIENCED CHEST PAIN- PT HAD CARDIAC CATH - TOLD NO CAD - THOUGHT TO HAVE HAS CORNARY ARTERY SPASM   GERD (gastroesophageal reflux disease)    mild    H/O seasonal allergies    Headache(784.0)    OCCAS SINUS HEADACHES   Hearing impaired    LEFT EAR DEAFNESS   Hypertension    220/104 IN SPRING 2015 - SAW HER DOCTOR - BUT B/P NORMALIZED - PT DECIDED TO STOP CAFFEINE AND HAS NOT HAD ANY ELEVATED PRESSURES SINCE JUNE   Osteopenia    Strabismus    HAD STRABISMUS SURGERY 1956 - BUT STILL HAS STRABISMUS   Vitamin D deficiency     Family History  Problem Relation Age of Onset   Hypertension Mother    Diabetes Mother    Obesity Mother    Heart disease Mother    Alcohol abuse Father    Colon cancer Brother 24   COPD Maternal Grandmother    Hyperlipidemia Maternal Grandmother    COPD Paternal Grandfather    Colon polyps Sister    Stomach cancer Neg Hx    Rectal cancer Neg Hx    Esophageal cancer Neg Hx    Pancreatic cancer Neg Hx     Past Surgical History:  Procedure Laterality Date   ABDOMINAL HYSTERECTOMY     BONE ANCHORED HEARING AID IMPLANT Left 01/26/2019   Procedure: BONE ANCHORED HEARING AID (BAHA) IMPLANT;  Surgeon: [name], MD;  Location: [facility];  Service: ENT;  Laterality: Left;   BREATH TEK H PYLORI N/A 10/08/2013   Procedure: BREATH TEK H PYLORI;  Surgeon: Gayland Curry, MD;  Location: [facility];  Service: General;  Laterality: N/A;   BREATH TEK H PYLORI N/A 12/03/2013   Procedure: Lauris Chroman;  Surgeon: Gayland Curry, MD;  Location: [facility];  Service: General;  Laterality: N/A;   CHOLECYSTECTOMY N/A 04/13/2016   Procedure: LAPAROSCOPIC CHOLECYSTECTOMY WITH ATTEMPTED INTRAOPERATIVE CHOLANGIOGRAM;  Surgeon: Greer Pickerel, MD;  Location: [facility];  Service: General;  Laterality: N/A;   COLONOSCOPY     EYE SURGERY Bilateral 2017   cataract surgery with lens implant   INNER EAR [place] GASTRIC SLEEVE RESECTION N/A 12/18/2013   Procedure: LAPAROSCOPIC GASTRIC SLEEVE RESECTION;  Surgeon: Greer Pickerel, MD;  Location: [facility];  Service: General;  Laterality: N/A;   REPAIR TENDONS FOOT Right 1994   AND RECONSTRUCTIVE SURGERY FOR MULTIPLE FRACTURES OF THE RIGHT FOOT   STOMACH SURGERY     strabismus repair     TONSILLECTOMY     UPPER GASTROINTESTINAL ENDOSCOPY     Social History   Occupational History   Occupation: Programmer, multimedia: [employer]  Tobacco Use   Smoking status: Never   Smokeless tobacco: Never  Vaping Use   Vaping Use: Never used  Substance and Sexual Activity   Alcohol use: No    Alcohol/week: 0.0 standard drinks   Drug use: No   Sexual activity: Yes    Birth control/protection: Surgical

## 2021-05-16 ENCOUNTER — Other Ambulatory Visit: Payer: Self-pay | Admitting: Cardiology

## 2021-05-31 ENCOUNTER — Other Ambulatory Visit: Payer: Self-pay

## 2021-05-31 ENCOUNTER — Emergency Department (HOSPITAL_COMMUNITY): Payer: Medicare PPO

## 2021-05-31 ENCOUNTER — Inpatient Hospital Stay (HOSPITAL_COMMUNITY)
Admission: EM | Admit: 2021-05-31 | Discharge: 2021-06-03 | DRG: 845 | Disposition: A | Discharge status: Home / Self Care | Payer: Medicare PPO | Attending: Family Medicine | Admitting: Family Medicine

## 2021-05-31 ENCOUNTER — Encounter (HOSPITAL_COMMUNITY): Payer: Self-pay | Admitting: Emergency Medicine

## 2021-05-31 DIAGNOSIS — Z885 Allergy status to narcotic agent status: Secondary | ICD-10-CM

## 2021-05-31 DIAGNOSIS — Z811 Family history of alcohol abuse and dependence: Secondary | ICD-10-CM | POA: Diagnosis not present

## 2021-05-31 DIAGNOSIS — C569 Malignant neoplasm of unspecified ovary: Secondary | ICD-10-CM | POA: Diagnosis present

## 2021-05-31 DIAGNOSIS — Z881 Allergy status to other antibiotic agents status: Secondary | ICD-10-CM

## 2021-05-31 DIAGNOSIS — C800 Disseminated malignant neoplasm, unspecified: Principal | ICD-10-CM | POA: Diagnosis present

## 2021-05-31 DIAGNOSIS — K5909 Other constipation: Secondary | ICD-10-CM | POA: Diagnosis present

## 2021-05-31 DIAGNOSIS — Z825 Family history of asthma and other chronic lower respiratory diseases: Secondary | ICD-10-CM

## 2021-05-31 DIAGNOSIS — I10 Essential (primary) hypertension: Secondary | ICD-10-CM | POA: Diagnosis present

## 2021-05-31 DIAGNOSIS — K219 Gastro-esophageal reflux disease without esophagitis: Secondary | ICD-10-CM | POA: Diagnosis present

## 2021-05-31 DIAGNOSIS — Z8371 Family history of colonic polyps: Secondary | ICD-10-CM

## 2021-05-31 DIAGNOSIS — M8580 Other specified disorders of bone density and structure, unspecified site: Secondary | ICD-10-CM | POA: Diagnosis present

## 2021-05-31 DIAGNOSIS — Z800 Family history of malignant neoplasm of digestive organs: Secondary | ICD-10-CM | POA: Diagnosis not present

## 2021-05-31 DIAGNOSIS — Z83438 Family history of other disorder of lipoprotein metabolism and other lipidemia: Secondary | ICD-10-CM

## 2021-05-31 DIAGNOSIS — Z8249 Family history of ischemic heart disease and other diseases of the circulatory system: Secondary | ICD-10-CM | POA: Diagnosis not present

## 2021-05-31 DIAGNOSIS — Z833 Family history of diabetes mellitus: Secondary | ICD-10-CM | POA: Diagnosis not present

## 2021-05-31 DIAGNOSIS — Z20822 Contact with and (suspected) exposure to covid-19: Secondary | ICD-10-CM | POA: Diagnosis present

## 2021-05-31 DIAGNOSIS — R1011 Right upper quadrant pain: Secondary | ICD-10-CM | POA: Diagnosis present

## 2021-05-31 DIAGNOSIS — Z791 Long term (current) use of non-steroidal anti-inflammatories (NSAID): Secondary | ICD-10-CM | POA: Diagnosis not present

## 2021-05-31 DIAGNOSIS — D72829 Elevated white blood cell count, unspecified: Secondary | ICD-10-CM

## 2021-05-31 DIAGNOSIS — R1900 Intra-abdominal and pelvic swelling, mass and lump, unspecified site: Secondary | ICD-10-CM

## 2021-05-31 DIAGNOSIS — Z79899 Other long term (current) drug therapy: Secondary | ICD-10-CM

## 2021-05-31 DIAGNOSIS — F32A Depression, unspecified: Secondary | ICD-10-CM | POA: Diagnosis present

## 2021-05-31 DIAGNOSIS — R109 Unspecified abdominal pain: Secondary | ICD-10-CM

## 2021-05-31 DIAGNOSIS — Z6835 Body mass index (BMI) 35.0-35.9, adult: Secondary | ICD-10-CM | POA: Diagnosis not present

## 2021-05-31 LAB — COMPREHENSIVE METABOLIC PANEL
ALT: 14 U/L (ref 0–44)
AST: 25 U/L (ref 15–41)
Albumin: 4 g/dL (ref 3.5–5.0)
Alkaline Phosphatase: 77 U/L (ref 38–126)
Anion gap: 13 (ref 5–15)
BUN: 21 mg/dL (ref 8–23)
CO2: 26 mmol/L (ref 22–32)
Calcium: 9.3 mg/dL (ref 8.9–10.3)
Chloride: 101 mmol/L (ref 98–111)
Creatinine, Ser: 0.71 mg/dL (ref 0.44–1.00)
GFR, Estimated: >60 mL/min (ref >60)
Glucose, Bld: 94 mg/dL (ref 70–99)
Potassium: 4.1 mmol/L (ref 3.5–5.1)
Sodium: 140 mmol/L (ref 135–145)
Total Bilirubin: 0.5 mg/dL (ref 0.3–1.2)
Total Protein: 6.8 g/dL (ref 6.5–8.1)

## 2021-05-31 LAB — URINALYSIS, ROUTINE W REFLEX MICROSCOPIC
Bacteria, UA: NONE SEEN
Bilirubin Urine: NEGATIVE
Glucose, UA: NEGATIVE mg/dL
Hgb urine dipstick: NEGATIVE
Ketones, ur: 80 mg/dL — AB
Nitrite: NEGATIVE
Protein, ur: NEGATIVE mg/dL
Specific Gravity, Urine: 1.012 (ref 1.005–1.030)
pH: 5 (ref 5.0–8.0)

## 2021-05-31 LAB — RESP PANEL BY RT-PCR (FLU A&B, COVID) ARPGX2
Influenza A by PCR: NEGATIVE
Influenza B by PCR: NEGATIVE
SARS Coronavirus 2 by RT PCR: NEGATIVE

## 2021-05-31 LAB — LIPASE, BLOOD: Lipase: 51 U/L (ref 11–51)

## 2021-05-31 LAB — CBC WITH DIFFERENTIAL/PLATELET
Abs Immature Granulocytes: 0.05 10*3/uL (ref 0.00–0.07)
Basophils Absolute: 0 10*3/uL (ref 0.0–0.1)
Basophils Relative: 0 %
Eosinophils Absolute: 0 10*3/uL (ref 0.0–0.5)
Eosinophils Relative: 0 %
HCT: 41.3 % (ref 36.0–46.0)
Hemoglobin: 13.4 g/dL (ref 12.0–15.0)
Immature Granulocytes: 0 %
Lymphocytes Relative: 29 %
Lymphs Abs: 3.5 10*3/uL (ref 0.7–4.0)
MCH: 31.3 pg (ref 26.0–34.0)
MCHC: 32.4 g/dL (ref 30.0–36.0)
MCV: 96.5 fL (ref 80.0–100.0)
Monocytes Absolute: 1.1 10*3/uL — ABNORMAL HIGH (ref 0.1–1.0)
Monocytes Relative: 9 %
Neutro Abs: 7.4 10*3/uL (ref 1.7–7.7)
Neutrophils Relative %: 62 %
Platelets: 440 10*3/uL — ABNORMAL HIGH (ref 150–400)
RBC: 4.28 MIL/uL (ref 3.87–5.11)
RDW: 13.2 % (ref 11.5–15.5)
WBC: 12.1 10*3/uL — ABNORMAL HIGH (ref 4.0–10.5)
nRBC: 0 % (ref 0.0–0.2)

## 2021-05-31 LAB — LACTIC ACID, PLASMA: Lactic Acid, Venous: 1.4 mmol/L (ref 0.5–1.9)

## 2021-05-31 MED ORDER — IOHEXOL 350 MG/ML SOLN
100.0000 mL | Freq: Once | INTRAVENOUS | Status: AC | PRN
  Administered 2021-05-31: 100 mL via INTRAVENOUS

## 2021-05-31 MED ORDER — PIPERACILLIN-TAZOBACTAM 3.375 G IVPB 30 MIN
3.3750 g | Freq: Once | INTRAVENOUS | Status: AC
  Administered 2021-05-31: 3.375 g via INTRAVENOUS
  Filled 2021-05-31: qty 50

## 2021-05-31 MED ORDER — SODIUM CHLORIDE 0.9 % IV BOLUS
500.0000 mL | Freq: Once | INTRAVENOUS | Status: AC
  Administered 2021-05-31: 500 mL via INTRAVENOUS

## 2021-05-31 NOTE — Assessment & Plan Note (Signed)
Ms. Wiegand is admitted to Med Surg Floor.  CT abdomen/pelvis with abnormal findings suggestive of Carcinomatosis. No primary source identified.  Surgery consulted by ER physician and will evaluate pt. Pt will need biopsy for definitive diagnosis.  Has thickened area of bowel with free fluid in abdominal cavity.  Started on Zosyn.  Pain control provided.  IVF hydration with LR at 100 ml/hr

## 2021-05-31 NOTE — Assessment & Plan Note (Addendum)
Pain control with tramadol for moderate pain.    Further workup with Surgery Check CMP in am

## 2021-05-31 NOTE — ED Triage Notes (Signed)
Pt reported to ED with c/o upper right sided abdominal pain that radiates into back, fever, nausea and lack of appetite. States pain has been ongoing for approximately 4 hrs. Has tenderness to palpation in RUQ, denies hx of gallbladder problems. Also expresses concerns for diverticulitis d/t hx of the same.

## 2021-05-31 NOTE — H&P (Signed)
History and Physical    Patient: [name] [name] QPY:[id number] DOB: April 09, 1950 DOA: 05/31/2021 DOS: the patient was seen and examined on 05/31/2021 PCP: Margretta Sidle, MD  Patient coming from: Home  Chief Complaint:  Chief Complaint  Patient presents with   Abdominal Pain    HPI: [name] [name] is a 71 y.o. female with medical history significant of OA, obesity who presents with abdominal pain and fever to 100 F.  She reports she has been having some abdominal pain and feeling bloated for the last week or 2 which she attributed to being on a ketogenic diet.  She reports that this morning pain became worse that she rated as a 6 out of 10 in severity and would wax and wane and would radiate into her back.  She then had a fever to 100 degrees at home but decided come for evaluation.  She reports she has been having constipation for the last week and has used over-the-counter laxatives.  She also was nauseous this afternoon but no vomiting.  She has not had cough, chest pain or palpitations.  She denies any urinary symptoms.  She developed a fever she was concerned of appendicitis.  She has had a cholecystectomy and a gastric sleeve.  She has never had bowel obstruction.  She reports that her appetite has been less recently and that she feels full quickly which she attributed to eating a higher content of proteins with her meals. She has been hemodynamically stable in the emergency room.  CT of her abdomen showed inflammation of the mesentery and peritoneal surface of her colon with differential diagnosis of infectious cause versus carcinomatosis.  Radiology favors the carcinomatosis.  Surgeries been consulted by the ER physician and will evaluate.  She is started on antibiotic therapy with Zosyn  Review of Systems: As mentioned in the history of present illness. All other systems reviewed and are negative. Past Medical History:  Diagnosis Date   Allergy    Anemia    IN THE PAST WITH  PREGNANCY   Arthritis    LEFT FOOT; OCCAS LOWER BACK PAIN - HX OF VERTEBRAL FRACTURES   Blood transfusion without reported diagnosis    DURING PREGNANCY   Cataract    removed both eyes    Complication of anesthesia    a little sluggist when waking up-no intervention required   Constipation    uses Colace 3 x a day and Dulcolax q week- with this has soft BM's- no pain    Coronary artery spasm ([facility]CC) 1990'S   AFTER OTC DIET PILLS - PT EXPERIENCED CHEST PAIN- PT HAD CARDIAC CATH - TOLD NO CAD - THOUGHT TO HAVE HAS CORNARY ARTERY SPASM   GERD (gastroesophageal reflux disease)    mild    H/O seasonal allergies    Headache(784.0)    OCCAS SINUS HEADACHES   Hearing impaired    LEFT EAR DEAFNESS   Hypertension    220/104 IN SPRING 2015 - SAW HER DOCTOR - BUT B/P NORMALIZED - PT DECIDED TO STOP CAFFEINE AND HAS NOT HAD ANY ELEVATED PRESSURES SINCE JUNE   Osteopenia    Strabismus    HAD STRABISMUS SURGERY 1956 - BUT STILL HAS STRABISMUS   Vitamin D deficiency    Past Surgical History:  Procedure Laterality Date   ABDOMINAL HYSTERECTOMY     BONE ANCHORED HEARING AID IMPLANT Left 01/26/2019   Procedure: BONE ANCHORED HEARING AID (BAHA) IMPLANT;  Surgeon: [name], MD;  Location: [facility];  Service:  ENT;  Laterality: Left;   BREATH TEK H PYLORI N/A 10/08/2013   Procedure: BREATH TEK H PYLORI;  Surgeon: Gayland Curry, MD;  Location: [facility];  Service: General;  Laterality: N/A;   BREATH TEK H PYLORI N/A 12/03/2013   Procedure: Lauris Chroman;  Surgeon: Gayland Curry, MD;  Location: [facility];  Service: General;  Laterality: N/A;   CHOLECYSTECTOMY N/A 04/13/2016   Procedure: LAPAROSCOPIC CHOLECYSTECTOMY WITH ATTEMPTED INTRAOPERATIVE CHOLANGIOGRAM;  Surgeon: Greer Pickerel, MD;  Location: [facility];  Service: General;  Laterality: N/A;   COLONOSCOPY     EYE SURGERY Bilateral 2017   cataract surgery with lens implant   INNER EAR [place] N/A 12/18/2013   Procedure: LAPAROSCOPIC GASTRIC SLEEVE RESECTION;  Surgeon: Greer Pickerel, MD;  Location: [facility];  Service: General;  Laterality: N/A;   REPAIR TENDONS FOOT Right 1994   AND RECONSTRUCTIVE SURGERY FOR MULTIPLE FRACTURES OF THE RIGHT FOOT   STOMACH SURGERY     strabismus repair     TONSILLECTOMY     UPPER GASTROINTESTINAL ENDOSCOPY     Social History:  reports that she has never smoked. She has never used smokeless tobacco. She reports that she does not drink alcohol and does not use drugs.  Allergies  Allergen Reactions   Augmentin [Amoxicillin-Pot Clavulanate] Diarrhea and Nausea Only   Morphine And Related Other (See Comments)    DIZZINESS HYPOTENSION HYPOXIA    Family History  Problem Relation Age of Onset   Hypertension Mother    Diabetes Mother    Obesity Mother    Heart disease Mother    Alcohol abuse Father    Colon cancer Brother 68   COPD Maternal Grandmother    Hyperlipidemia Maternal Grandmother    COPD Paternal Grandfather    Colon polyps Sister    Stomach cancer Neg Hx    Rectal cancer Neg Hx    Esophageal cancer Neg Hx    Pancreatic cancer Neg Hx     Prior to Admission medications   Medication Sig Start Date End Date Taking? Authorizing Provider  amitriptyline (ELAVIL) 10 MG tablet Take 20 mg by mouth daily. 02/06/21   [provider]  bisacodyl (DULCOLAX) 5 MG EC tablet Take 5 mg by mouth once a week.     [provider]  Calcium 600-200 MG-UNIT tablet Take 1 tablet by mouth daily.    [provider]  diclofenac Sodium (VOLTAREN) 1 % GEL Apply 2 g topically 4 (four) times daily as needed (dry skin, rash). 02/20/19   [provider]  diltiazem (CARDIZEM) 30 MG tablet TAKE 1 TABLET (30 MG TOTAL) BY MOUTH EVERY 8 (EIGHT) HOURS AS NEEDED (IF HEART RATE IS GREATER THAN 027 AND IF SYSTOLIC BLOOD PRESSURE (TOP NUMBER) IS GREATER THAN 110 MMHG). 05/18/21   Tobb, Kardie, DO  docusate sodium  (COLACE) 100 MG capsule Take 100-200 mg by mouth See admin instructions. Take 200 mg in the Morning and 100 mg in the evening    [provider]  FERROCITE 324 MG TABS tablet Take 324 mg of iron by mouth every Monday, Wednesday, and Friday. 12/04/20   [provider]  fluticasone (FLONASE) 50 MCG/ACT nasal spray Place 2 sprays into both nostrils as needed.    [provider]  meloxicam (MOBIC) 15 MG tablet TAKE 1 TABLET BY MOUTH EVERY DAY Patient taking differently: Take 15 mg by mouth daily as  needed for pain. 11/14/18   Lucille Passy, MD  metoprolol tartrate (LOPRESSOR) 100 MG tablet Take 2 hours prior to CT 02/17/21   Tobb, Kardie, DO  nystatin (MYCOSTATIN/NYSTOP) powder Apply topically 2 (two) times daily. Patient taking differently: Apply 1 application topically 2 (two) times daily as needed (infection). 12/13/18   Lucille Passy, MD  Polyethyl Glycol-Propyl Glycol (SYSTANE) 0.4-0.3 % SOLN Apply 1 drop to eye daily.    [provider]  vitamin B-12 (CYANOCOBALAMIN) 500 MCG tablet Take 500 mcg by mouth daily.    [provider]  Vitamin D, Cholecalciferol, 1000 units TABS Take 1,000 mcg by mouth daily.     [provider]    Physical Exam: Vitals:   05/31/21 2245 05/31/21 2300 05/31/21 2315 05/31/21 2322  BP: 123/67 132/79 129/74   Pulse: 95 93 90   Resp:   16   Temp:      TempSrc:      SpO2: 98% 100% 96% 100%   General: WDWN, Alert and oriented x3.  Eyes: EOMI, PERRL, conjunctivae normal.  Sclera nonicteric HENT:  [place]/AT, external ears normal.  Nares patent without epistasis.  Mucous membranes are moist.  Neck: Soft, normal range of motion, supple, no masses,  Trachea midline Respiratory: clear to auscultation bilaterally, no wheezing, no crackles. Normal respiratory effort. No accessory muscle use.  Cardiovascular: Regular rate and rhythm, no murmurs / rubs / gallops. No extremity edema. 2+ pedal pulses. Abdomen: Soft, mild diffuse  tenderness, nondistended, no rebound or guarding.  No masses palpated. Bowel sounds normoactive Musculoskeletal: FROM. no cyanosis.Normal muscle tone.  Skin: Warm, dry, intact no rashes, lesions, ulcers. No induration Neurologic: CN 2-12 grossly intact.  Normal speech.  Sensation intact, Strength 5/5 in all extremities.   Psychiatric: Normal judgment and insight.  Normal mood.   Data Reviewed: Lab Work:    WBC 12,100 hemoglobin 13.4 hematocrit 41.3 platelets 440,000.  CMP normal.  COVID-negative.  Influenza A and B negative Urinalysis with small leukocytes and moderate ketones.  Is noted patient has been on a ketogenic diet recently  CT of the abdomen pelvis with contrast shows abnormal appearance of the mesentery with free intraperitoneal fluid and thickening of the peritoneal surface of the pelvis suggestive of peritoneal carcinomatosis.  Infectious peritonitis is also on differential diagnosis but radiology favors carcinomatosis  Assessment and Plan: * Carcinomatosis ([place])- (present on admission) [name] [name] is admitted to Med Surg Floor.  CT abdomen/pelvis with abnormal findings suggestive of Carcinomatosis. No primary source identified.  Surgery consulted by ER physician and will evaluate pt. Pt will need biopsy for definitive diagnosis.  Has thickened area of bowel with free fluid in abdominal cavity.  Started on Zosyn.  Pain control provided.  IVF hydration with LR at 100 ml/hr  Abdominal pain Pain control with tramadol for moderate pain.    Further workup with Surgery Check CMP in am  Leukocytosis Started on zosyn Check CBC in am  Advance Care Planning:   Code Status:  Full Code.  SCDs for DVT prophylaxis.   Consults: General Surgery-Dr. Zenia Resides  Family Communication: Diagnosis and plan discussed with patient and her husband who is at bedside.  They verbalized understanding agree with plan.  Questions answered.  Further recommendations to follow as clinical  indicated  Author: Eben Burow, MD 05/31/2021 11:48 PM  For on call review [email or web address].

## 2021-05-31 NOTE — Consult Note (Addendum)
[name] [name] 12-21-1950  [id number].    Requesting MD: Dr. Melina Copa Chief Complaint/Reason for Consult: abdominal pain, possible carcinomatosis  HPI:  [name] [name] is a 71 yo female who presented to the ED with abdominal pain. She says that yesterday she began having sharp pain in the right lower quadrant, and had a low grade fever to about 100. She was concerned she had appendicitis and came to the ED for evaluation. Prior to this, she had been having generalized abdominal discomfort and pain for approximately 2 weeks. She reports a history of chronic constipation but feels it has been worse in the last few weeks. She says she occasionally feels "queasy" but has not had any nausea or vomiting. She denies any unintentional weight loss. Since arrival to the ED she has been afebrile and hemodynamically stable. WBC is mildly elevated at 12 but labs are otherwise unremarkable. A CT abd/pelvis showed some thickening of the peritoneum and mesentery, concerning for possible carcinomatosis. The patient has been started on antibiotics and general surgery was consulted.  Prior abdominal surgeries include a sleeve gastrectomy in 2015 and lap chole in 2018 (both by Dr. [name]). She has a history of colon polyps and her last colonoscopy was in May 2021, at which time she had sigmoid diverticulosis but no other abnormalities. Her brother had colon cancer and was diagnosed at age 35, but she otherwise has no known family history of cancer.  ROS: Review of Systems  Constitutional:  Positive for fever and malaise/fatigue. Negative for chills and weight loss.  HENT:  Negative for sore throat.   Eyes:  Negative for blurred vision and redness.  Respiratory:  Negative for shortness of breath, wheezing and stridor.   Cardiovascular:  Negative for chest pain.  Gastrointestinal:  Positive for abdominal pain and constipation. Negative for blood in stool, nausea and vomiting.  Genitourinary:  Negative for dysuria.   Musculoskeletal:  Negative for back pain.  Neurological:  Negative for focal weakness and seizures.  Endo/Heme/Allergies:  Does not bruise/bleed easily.   Family History  Problem Relation Age of Onset   Hypertension Mother    Diabetes Mother    Obesity Mother    Heart disease Mother    Alcohol abuse Father    Colon cancer Brother 52   COPD Maternal Grandmother    Hyperlipidemia Maternal Grandmother    COPD Paternal Grandfather    Colon polyps Sister    Stomach cancer Neg Hx    Rectal cancer Neg Hx    Esophageal cancer Neg Hx    Pancreatic cancer Neg Hx     Past Medical History:  Diagnosis Date   Allergy    Anemia    IN THE PAST WITH PREGNANCY   Arthritis    LEFT FOOT; OCCAS LOWER BACK PAIN - HX OF VERTEBRAL FRACTURES   Blood transfusion without reported diagnosis    DURING PREGNANCY   Cataract    removed both eyes    Complication of anesthesia    a little sluggist when waking up-no intervention required   Constipation    uses Colace 3 x a day and Dulcolax q week- with this has soft BM's- no pain    Coronary artery spasm (HCC) 1990'S   AFTER OTC DIET PILLS - PT EXPERIENCED CHEST PAIN- PT HAD CARDIAC CATH - TOLD NO CAD - THOUGHT TO HAVE HAS CORNARY ARTERY SPASM   GERD (gastroesophageal reflux disease)    mild    H/O seasonal allergies    Headache(784.0)  OCCAS SINUS HEADACHES   Hearing impaired    LEFT EAR DEAFNESS   Hypertension    220/104 IN SPRING 2015 - SAW HER DOCTOR - BUT B/P NORMALIZED - PT DECIDED TO STOP CAFFEINE AND HAS NOT HAD ANY ELEVATED PRESSURES SINCE JUNE   Osteopenia    Strabismus    HAD STRABISMUS SURGERY 1956 - BUT STILL HAS STRABISMUS   Vitamin D deficiency     Past Surgical History:  Procedure Laterality Date   ABDOMINAL HYSTERECTOMY     BONE ANCHORED HEARING AID IMPLANT Left 01/26/2019   Procedure: BONE ANCHORED HEARING AID (BAHA) IMPLANT;  Surgeon: [name], MD;  Location: [facility];  Service: ENT;  Laterality: Left;   BREATH TEK H  PYLORI N/A 10/08/2013   Procedure: BREATH TEK H PYLORI;  Surgeon: Gayland Curry, MD;  Location: [facility];  Service: General;  Laterality: N/A;   BREATH TEK H PYLORI N/A 12/03/2013   Procedure: Lauris Chroman;  Surgeon: Gayland Curry, MD;  Location: [facility];  Service: General;  Laterality: N/A;   CHOLECYSTECTOMY N/A 04/13/2016   Procedure: LAPAROSCOPIC CHOLECYSTECTOMY WITH ATTEMPTED INTRAOPERATIVE CHOLANGIOGRAM;  Surgeon: Greer Pickerel, MD;  Location: [facility];  Service: General;  Laterality: N/A;   COLONOSCOPY     EYE SURGERY Bilateral 2017   cataract surgery with lens implant   [place] GASTRIC SLEEVE RESECTION N/A 12/18/2013   Procedure: LAPAROSCOPIC GASTRIC SLEEVE RESECTION;  Surgeon: Greer Pickerel, MD;  Location: [facility];  Service: General;  Laterality: N/A;   REPAIR TENDONS FOOT Right 1994   AND RECONSTRUCTIVE SURGERY FOR MULTIPLE FRACTURES OF THE RIGHT FOOT   STOMACH SURGERY     strabismus repair     TONSILLECTOMY     UPPER GASTROINTESTINAL ENDOSCOPY      Social History:  reports that she has never smoked. She has never used smokeless tobacco. She reports that she does not drink alcohol and does not use drugs.  Allergies:  Allergies  Allergen Reactions   Augmentin [Amoxicillin-Pot Clavulanate] Diarrhea and Nausea Only   Morphine And Related Other (See Comments)    DIZZINESS HYPOTENSION HYPOXIA    (Not in a hospital admission)    Physical Exam: Blood pressure 110/68, pulse 90, temperature 99.1 F (37.3 C), temperature source Oral, resp. rate 19, SpO2 95 %. General: resting comfortably, appears stated age, no apparent distress Neurological: alert and oriented, no focal deficits, cranial nerves grossly in tact HEENT: normocephalic, atraumatic, oropharynx clear CV: regular rate and rhythm, no murmurs, extremities warm and well-perfused Respiratory: normal work of breathing on room air, symmetric chest wall  expansion Abdomen: soft, nondistended, mildly tender to palpation across the mid-abdomen. No masses or organomegaly, no rebound tenderness. Well-healed surgical scars. Extremities: warm and well-perfused, no deformities, moving all extremities spontaneously Psychiatric: normal mood and affect Skin: warm and dry, no jaundice, no rashes or lesions   Results for orders placed or performed during the hospital encounter of 05/31/21 (from the past 48 hour(s))  Lactic acid, plasma     Status: None   Collection Time: 05/31/21  8:31 PM  Result Value Ref Range   Lactic Acid, Venous 1.4 0.5 - 1.9 mmol/L    Comment: Performed at [facility], [address], [place], [place] [address]  Comprehensive metabolic panel     Status: None   Collection Time: 05/31/21  8:33 PM  Result Value Ref Range   Sodium 140 135 -  145 mmol/L   Potassium 4.1 3.5 - 5.1 mmol/L   Chloride 101 98 - 111 mmol/L   CO2 26 22 - 32 mmol/L   Glucose, Bld 94 70 - 99 mg/dL    Comment: Glucose reference range applies only to samples taken after fasting for at least 8 hours.   BUN 21 8 - 23 mg/dL   Creatinine, Ser 0.71 0.44 - 1.00 mg/dL   Calcium 9.3 8.9 - 10.3 mg/dL   Total Protein 6.8 6.5 - 8.1 g/dL   Albumin 4.0 3.5 - 5.0 g/dL   AST 25 15 - 41 U/L   ALT 14 0 - 44 U/L   Alkaline Phosphatase 77 38 - 126 U/L   Total Bilirubin 0.5 0.3 - 1.2 mg/dL   GFR, Estimated >60 >60 mL/min    Comment: (NOTE) Calculated using the CKD-EPI Creatinine Equation (2021)    Anion gap 13 5 - 15    Comment: Performed at [facility] [address], [place], [place] [address]  CBC with Differential     Status: Abnormal   Collection Time: 05/31/21  8:33 PM  Result Value Ref Range   WBC 12.1 (H) 4.0 - 10.5 K/uL   RBC 4.28 3.87 - 5.11 MIL/uL   Hemoglobin 13.4 12.0 - 15.0 g/dL   HCT 41.3 36.0 - 46.0 %   MCV 96.5 80.0 - 100.0 fL   MCH 31.3 26.0 - 34.0 pg   MCHC 32.4 30.0 - 36.0 g/dL   RDW 13.2 11.5 - 15.5 %   Platelets 440 (H) 150 -  400 K/uL   nRBC 0.0 0.0 - 0.2 %   Neutrophils Relative % 62 %   Neutro Abs 7.4 1.7 - 7.7 K/uL   Lymphocytes Relative 29 %   Lymphs Abs 3.5 0.7 - 4.0 K/uL   Monocytes Relative 9 %   Monocytes Absolute 1.1 (H) 0.1 - 1.0 K/uL   Eosinophils Relative 0 %   Eosinophils Absolute 0.0 0.0 - 0.5 K/uL   Basophils Relative 0 %   Basophils Absolute 0.0 0.0 - 0.1 K/uL   Immature Granulocytes 0 %   Abs Immature Granulocytes 0.05 0.00 - 0.07 K/uL    Comment: Performed at [facility] [address], [place], [place] [address]  Lipase, blood     Status: None   Collection Time: 05/31/21  8:33 PM  Result Value Ref Range   Lipase 51 11 - 51 U/L    Comment: Performed at [facility] [address], [place], [place] [address]  Urinalysis, Routine w reflex microscopic     Status: Abnormal   Collection Time: 05/31/21 10:12 PM  Result Value Ref Range   Color, Urine YELLOW YELLOW   APPearance CLEAR CLEAR   Specific Gravity, Urine 1.012 1.005 - 1.030   pH 5.0 5.0 - 8.0   Glucose, UA NEGATIVE NEGATIVE mg/dL   Hgb urine dipstick NEGATIVE NEGATIVE   Bilirubin Urine NEGATIVE NEGATIVE   Ketones, ur 80 (A) NEGATIVE mg/dL   Protein, ur NEGATIVE NEGATIVE mg/dL   Nitrite NEGATIVE NEGATIVE   Leukocytes,Ua SMALL (A) NEGATIVE   RBC / HPF 0-5 0 - 5 RBC/hpf   WBC, UA 6-10 0 - 5 WBC/hpf   Bacteria, UA NONE SEEN NONE SEEN   Squamous Epithelial / LPF 0-5 0 - 5   Mucus PRESENT     Comment: Performed at [facility], [place] [address], [place], [place] [address]  Resp Panel by RT-PCR (Flu  A&B, Covid) Nasopharyngeal Swab     Status: None   Collection Time: 05/31/21 10:30 PM   Specimen: Nasopharyngeal Swab; Nasopharyngeal(NP) swabs in vial transport medium  Result Value Ref Range   SARS Coronavirus 2 by RT PCR NEGATIVE NEGATIVE    Comment: (NOTE) SARS-CoV-2 target nucleic acids are NOT DETECTED.  The SARS-CoV-2 RNA is generally detectable in upper respiratory specimens during the acute phase of  infection. The lowest concentration of SARS-CoV-2 viral copies this assay can detect is 138 copies/mL. A negative result does not preclude SARS-Cov-2 infection and should not be used as the sole basis for treatment or other patient management decisions. A negative result may occur with  improper specimen collection/handling, submission of specimen other than nasopharyngeal swab, presence of viral mutation(s) within the areas targeted by this assay, and inadequate number of viral copies(<138 copies/mL). A negative result must be combined with clinical observations, patient history, and epidemiological information. The expected result is Negative.  Fact Sheet for Patients:  EntrepreneurPulse.com.au  Fact Sheet for Healthcare Providers:  IncredibleEmployment.be  This test is no t yet approved or cleared by the [place] FDA and  has been authorized for detection and/or diagnosis of SARS-CoV-2 by FDA under an Emergency Use Authorization (EUA). This EUA will remain  in effect (meaning this test can be used) for the duration of the COVID-19 declaration under Section 564(b)(1) of the Act, 21 U.S.C.section 360bbb-3(b)(1), unless the authorization is terminated  or revoked sooner.       Influenza A by PCR NEGATIVE NEGATIVE   Influenza B by PCR NEGATIVE NEGATIVE    Comment: (NOTE) The Xpert Xpress SARS-CoV-2/FLU/RSV plus assay is intended as an aid in the diagnosis of influenza from Nasopharyngeal swab specimens and should not be used as a sole basis for treatment. Nasal washings and aspirates are unacceptable for Xpert Xpress SARS-CoV-2/FLU/RSV testing.  Fact Sheet for Patients: EntrepreneurPulse.com.au  Fact Sheet for Healthcare Providers: IncredibleEmployment.be  This test is not yet approved or cleared by the [place] FDA and has been authorized for detection and/or diagnosis of SARS-CoV-2 by FDA  under an Emergency Use Authorization (EUA). This EUA will remain in effect (meaning this test can be used) for the duration of the COVID-19 declaration under Section 564(b)(1) of the Act, 21 U.S.C. section 360bbb-3(b)(1), unless the authorization is terminated or revoked.  Performed at [facility], [place] [address], [place],  [address]    CT Abdomen Pelvis W Contrast  Result Date: 05/31/2021 CLINICAL DATA:  Right lower quadrant abdominal pain and fever. Radiating to the back. EXAM: CT ABDOMEN AND PELVIS WITH CONTRAST TECHNIQUE: Multidetector CT imaging of the abdomen and pelvis was performed using the standard protocol following bolus administration of intravenous contrast. RADIATION DOSE REDUCTION: This exam was performed according to the departmental dose-optimization program which includes automated exposure control, adjustment of the mA and/or kV according to patient size and/or use of iterative reconstruction technique. CONTRAST:  158mL OMNIPAQUE IOHEXOL 350 MG/ML SOLN COMPARISON:  04/01/2018 FINDINGS: Lower chest: Mild scarring in the medial right middle lobe and lingula. Hepatobiliary: Liver parenchyma is normal. Previous cholecystectomy. Mild biliary ductal prominence, typical following cholecystectomy and similar to the study 2019. Pancreas: Normal Spleen: Normal Adrenals/Urinary Tract: Adrenal glands are normal. Kidneys are normal. Bladder is normal. Stomach/Bowel: Previous gastric surgery. I do not see evidence of small-bowel obstruction or large bowel obstruction. The appendix is normal. There is an abnormal appearance of the mesentery worrisome for peritoneal carcinomatosis. There is free fluid in  the pelvis with slight nodularity of the peritoneal surfaces. I cannot be sure the source of this process. I might question the mid transverse colon. One could also consider infectious peritonitis causing this appearance. Vascular/Lymphatic: Aorta and IVC are normal.  No adenopathy.  Reproductive: Previous hysterectomy.  No evidence of ovarian mass. Other: No free air.  No hernia. Musculoskeletal: Old superior endplate compression deformity of T12. IMPRESSION: Abnormal appearance of the mesentery, free intraperitoneal fluid, and slightly thickened appearance of the peritoneal surfaces in the pelvis suggesting peritoneal carcinomatosis. Conceivably, infectious peritonitis could have this appearance as well, but peritoneal carcinomatosis is favored. I do not see a definite site of origin. I wonder about the mid transverse colon, but this is not definite. Previous cholecystectomy.  Previous hysterectomy. Electronically Signed   By: Nelson Chimes M.D.   On: 05/31/2021 22:21   DG Chest Port 1 View  Result Date: 05/31/2021 CLINICAL DATA:  Upper abdominal pain EXAM: PORTABLE CHEST 1 VIEW COMPARISON:  01/09/2021 FINDINGS: The heart size and mediastinal contours are within normal limits. Both lungs are clear. The visualized skeletal structures are unremarkable. IMPRESSION: No active disease. Electronically Signed   By: Fidela Salisbury M.D.   On: 05/31/2021 21:02      Assessment/Plan 71 yo female presenting with worsening abdominal pain and constipation. I reviewed her CT scan. There are no signs of bowel obstruction and the appendix is gas-filled and normal in caliber. There is a small amount of free fluid in the pelvis, with subtle irregularities of the mesentery and peritoneal lining. In the absence of a clear source of infection, these findings are suspicious for malignancy. There is no clear mass or primary tumor. I discussed with the patient that the best way to further assess this and obtain a tissue diagnosis would be a diagnostic laparoscopy. - Please keep NPO for possible procedure, will discuss timing with oncoming surgeon today. - No source of intraabdominal infection, no indication for antibiotics from a surgical standpoint - General surgery will follow while inpatient   Michaelle Birks, MD [facility] General, Hepatobiliary and Pancreatic Surgery 06/01/21 5:23 AM

## 2021-05-31 NOTE — Assessment & Plan Note (Signed)
Started on zosyn Check CBC in am

## 2021-05-31 NOTE — ED Provider Notes (Signed)
[facility] EMERGENCY DEPARTMENT Provider Note   CSN: [id number] Arrival date & time: 05/31/21  1921     History  Chief Complaint  Patient presents with   Abdominal Pain    [name] [name] is a 71 y.o. female.  She is here with a complaint of right-sided abdominal pain 6 out of 10 intensity waxing and waning radiating into the back its been going on for the last 4 to 5 hours.  She has been feeling constipated this week and has been using some over-the-counter laxatives.  She did have a fever and feels nauseous.  No cough or shortness of breath but taking a deep breath makes her abdomen hurt.  No urinary symptoms.  She has a prior history of a gastric sleeve and a cholecystectomy.  The history is provided by the patient.  Abdominal Pain Pain location:  RUQ and R flank Pain quality: aching   Pain radiates to:  Back Pain severity:  Moderate Onset quality:  Gradual Duration:  5 hours Timing:  Constant Progression:  Waxing and waning Chronicity:  Recurrent Context: not trauma   Relieved by:  None tried Worsened by:  Deep breathing, palpation and movement Ineffective treatments:  Bowel activity Associated symptoms: constipation, fever and nausea   Associated symptoms: no chest pain, no chills, no cough, no diarrhea, no dysuria, no shortness of breath, no sore throat and no vomiting       Home Medications Prior to Admission medications   Medication Sig Start Date End Date Taking? Authorizing Provider  amitriptyline (ELAVIL) 10 MG tablet Take 20 mg by mouth daily. 02/06/21   [provider]  bisacodyl (DULCOLAX) 5 MG EC tablet Take 5 mg by mouth once a week.     [provider]  Calcium 600-200 MG-UNIT tablet Take 1 tablet by mouth daily.    [provider]  diclofenac Sodium (VOLTAREN) 1 % GEL Apply 2 g topically 4 (four) times daily as needed (dry skin, rash). 02/20/19   [provider]  diltiazem (CARDIZEM) 30 MG tablet  TAKE 1 TABLET (30 MG TOTAL) BY MOUTH EVERY 8 (EIGHT) HOURS AS NEEDED (IF HEART RATE IS GREATER THAN 109 AND IF SYSTOLIC BLOOD PRESSURE (TOP NUMBER) IS GREATER THAN 110 MMHG). 05/18/21   Tobb, Kardie, DO  docusate sodium (COLACE) 100 MG capsule Take 100-200 mg by mouth See admin instructions. Take 200 mg in the Morning and 100 mg in the evening    [provider]  FERROCITE 324 MG TABS tablet Take 324 mg of iron by mouth every Monday, Wednesday, and Friday. 12/04/20   [provider]  fluticasone (FLONASE) 50 MCG/ACT nasal spray Place 2 sprays into both nostrils as needed.    [provider]  meloxicam (MOBIC) 15 MG tablet TAKE 1 TABLET BY MOUTH EVERY DAY Patient taking differently: Take 15 mg by mouth daily as needed for pain. 11/14/18   Lucille Passy, MD  metoprolol tartrate (LOPRESSOR) 100 MG tablet Take 2 hours prior to CT 02/17/21   Tobb, Kardie, DO  nystatin (MYCOSTATIN/NYSTOP) powder Apply topically 2 (two) times daily. Patient taking differently: Apply 1 application topically 2 (two) times daily as needed (infection). 12/13/18   Lucille Passy, MD  Polyethyl Glycol-Propyl Glycol (SYSTANE) 0.4-0.3 % SOLN Apply 1 drop to eye daily.    [provider]  vitamin B-12 (CYANOCOBALAMIN) 500 MCG tablet Take 500 mcg by mouth daily.    [provider]  Vitamin D, Cholecalciferol, 1000 units  TABS Take 1,000 mcg by mouth daily.     [provider]      Allergies    Augmentin [amoxicillin-pot clavulanate] and Morphine and related    Review of Systems   Review of Systems  Constitutional:  Positive for fever. Negative for chills.  HENT:  Negative for sore throat.   Eyes:  Negative for visual disturbance.  Respiratory:  Negative for cough and shortness of breath.   Cardiovascular:  Negative for chest pain.  Gastrointestinal:  Positive for abdominal pain, constipation and nausea. Negative for diarrhea and vomiting.  Genitourinary:  Negative for dysuria.   Musculoskeletal:  Positive for back pain.  Skin:  Negative for rash.  Neurological:  Negative for headaches.   Physical Exam Updated Vital Signs BP (!) 155/90 (BP Location: Right Arm)    Pulse (!) 126    Temp 99.1 F (37.3 C) (Oral)    Resp (!) 22    SpO2 96%  Physical Exam Vitals and nursing note reviewed.  Constitutional:      General: She is not in acute distress.    Appearance: She is well-developed.  HENT:     Head: Normocephalic and atraumatic.  Eyes:     Conjunctiva/sclera: Conjunctivae normal.  Cardiovascular:     Rate and Rhythm: Regular rhythm. Tachycardia present.     Heart sounds: No murmur heard. Pulmonary:     Effort: Pulmonary effort is normal. No respiratory distress.     Breath sounds: Normal breath sounds.  Abdominal:     Palpations: Abdomen is soft.     Tenderness: There is abdominal tenderness in the right upper quadrant. There is no guarding or rebound.  Musculoskeletal:        General: No swelling.     Cervical back: Neck supple.  Skin:    General: Skin is warm and dry.     Capillary Refill: Capillary refill takes less than 2 seconds.  Neurological:     General: No focal deficit present.     Mental Status: She is alert.    ED Results / Procedures / Treatments   Labs (all labs ordered are listed, but only abnormal results are displayed) Labs Reviewed  CBC WITH DIFFERENTIAL/PLATELET - Abnormal; Notable for the following components:      Result Value   WBC 12.1 (*)    Platelets 440 (*)    Monocytes Absolute 1.1 (*)    All other components within normal limits  URINALYSIS, ROUTINE W REFLEX MICROSCOPIC - Abnormal; Notable for the following components:   Ketones, ur 80 (*)    Leukocytes,Ua SMALL (*)    All other components within normal limits  COMPREHENSIVE METABOLIC PANEL - Abnormal; Notable for the following components:   Calcium 8.5 (*)    Total Protein 6.0 (*)    All other components within normal limits  RESP PANEL BY RT-PCR (FLU A&B,  COVID) ARPGX2  LACTIC ACID, PLASMA  COMPREHENSIVE METABOLIC PANEL  LIPASE, BLOOD  CBC  HIV ANTIBODY (ROUTINE TESTING W REFLEX)  CEA  CANCER ANTIGEN 19-9  CA 125    EKG EKG Interpretation  Date/Time:  Sunday May 31 2021 19:34:44 EST Ventricular Rate:  113 PR Interval:  138 QRS Duration: 80 QT Interval:  328 QTC Calculation: 449 R Axis:   79 Text Interpretation: Sinus tachycardia Nonspecific ST abnormality Abnormal ECG When compared with ECG of 09-Jan-2021 20:04, rate is increased Confirmed by Aletta Edouard [phone]) on 05/31/2021 8:22:40 PM  Radiology CT Abdomen Pelvis W Contrast  Result  Date: 05/31/2021 CLINICAL DATA:  Right lower quadrant abdominal pain and fever. Radiating to the back. EXAM: CT ABDOMEN AND PELVIS WITH CONTRAST TECHNIQUE: Multidetector CT imaging of the abdomen and pelvis was performed using the standard protocol following bolus administration of intravenous contrast. RADIATION DOSE REDUCTION: This exam was performed according to the departmental dose-optimization program which includes automated exposure control, adjustment of the mA and/or kV according to patient size and/or use of iterative reconstruction technique. CONTRAST:  145mL OMNIPAQUE IOHEXOL 350 MG/ML SOLN COMPARISON:  04/01/2018 FINDINGS: Lower chest: Mild scarring in the medial right middle lobe and lingula. Hepatobiliary: Liver parenchyma is normal. Previous cholecystectomy. Mild biliary ductal prominence, typical following cholecystectomy and similar to the study 2019. Pancreas: Normal Spleen: Normal Adrenals/Urinary Tract: Adrenal glands are normal. Kidneys are normal. Bladder is normal. Stomach/Bowel: Previous gastric surgery. I do not see evidence of small-bowel obstruction or large bowel obstruction. The appendix is normal. There is an abnormal appearance of the mesentery worrisome for peritoneal carcinomatosis. There is free fluid in the pelvis with slight nodularity of the peritoneal surfaces. I  cannot be sure the source of this process. I might question the mid transverse colon. One could also consider infectious peritonitis causing this appearance. Vascular/Lymphatic: Aorta and IVC are normal.  No adenopathy. Reproductive: Previous hysterectomy.  No evidence of ovarian mass. Other: No free air.  No hernia. Musculoskeletal: Old superior endplate compression deformity of T12. IMPRESSION: Abnormal appearance of the mesentery, free intraperitoneal fluid, and slightly thickened appearance of the peritoneal surfaces in the pelvis suggesting peritoneal carcinomatosis. Conceivably, infectious peritonitis could have this appearance as well, but peritoneal carcinomatosis is favored. I do not see a definite site of origin. I wonder about the mid transverse colon, but this is not definite. Previous cholecystectomy.  Previous hysterectomy. Electronically Signed   By: Nelson Chimes M.D.   On: 05/31/2021 22:21   DG Chest Port 1 View  Result Date: 05/31/2021 CLINICAL DATA:  Upper abdominal pain EXAM: PORTABLE CHEST 1 VIEW COMPARISON:  01/09/2021 FINDINGS: The heart size and mediastinal contours are within normal limits. Both lungs are clear. The visualized skeletal structures are unremarkable. IMPRESSION: No active disease. Electronically Signed   By: Fidela Salisbury M.D.   On: 05/31/2021 21:02    Procedures Procedures    Medications Ordered in ED Medications - No data to display  ED Course/ Medical Decision Making/ A&P Clinical Course as of 06/01/21 1025  Sun May 31, 2021  2058 Chest x-ray interpreted by me as no acute infiltrate no free air under the diaphragm. [MB]  2317 Discussed with Dr. Mallory Shirk night hospitalist.  He asked that we consult general surgery for possible biopsy. [MB]  2328 Discussed with Dr. Zenia Resides general surgery who will consult on patient. [MB]    Clinical Course User Index [MB] Hayden Rasmussen, MD                           Medical Decision Making Amount and/or Complexity  of Data Reviewed Labs: ordered. Radiology: ordered.  Risk Prescription drug management. Decision regarding hospitalization.  This patient complains of abdominal pain fever nausea; this involves an extensive number of treatment Options and is a complaint that carries with it a high risk of complications and morbidity. The differential includes obstruction, perforation, colitis, diverticulitis, appendicitis, UTI  I ordered, reviewed and interpreted labs, which included CBC with mildly elevated white count, stable hemoglobin, chemistries and LFTs fairly unremarkable, urinalysis with ketones, COVID and  flu negative I ordered medication IV fluids and antibiotics and reviewed PMP when indicated. I ordered imaging studies which included chest x-ray and CT abdomen and pelvis and I independently    visualized and interpreted imaging which showed small amount of free fluid and some omental thickening Additional history obtained from patient's husband Previous records obtained and reviewed in epic, no recent admissions I consulted Dr. Tonie Griffith [facility] hospitalist and Dr. Zenia Resides general surgery and discussed lab and imaging findings and discussed disposition.  Cardiac monitoring reviewed, normal sinus rhythm Social determinants considered, no significant barriers Critical Interventions: None  After the interventions stated above, I reevaluated the patient and found patient still to be symptomatic. Admission and further testing considered, patient will need to be admitted to the hospital for further work-up.  Patient in agreement with plan.          Final Clinical Impression(s) / ED Diagnoses Final diagnoses:  Carcinomatosis ([place])  Abdominal pain, unspecified abdominal location    Rx / DC Orders ED Discharge Orders     None         Hayden Rasmussen, MD 06/01/21 [id number]

## 2021-06-01 LAB — COMPREHENSIVE METABOLIC PANEL
ALT: 13 U/L (ref 0–44)
AST: 19 U/L (ref 15–41)
Albumin: 3.6 g/dL (ref 3.5–5.0)
Alkaline Phosphatase: 61 U/L (ref 38–126)
Anion gap: 10 (ref 5–15)
BUN: 11 mg/dL (ref 8–23)
CO2: 25 mmol/L (ref 22–32)
Calcium: 8.5 mg/dL — ABNORMAL LOW (ref 8.9–10.3)
Chloride: 106 mmol/L (ref 98–111)
Creatinine, Ser: 0.64 mg/dL (ref 0.44–1.00)
GFR, Estimated: >60 mL/min (ref >60)
Glucose, Bld: 96 mg/dL (ref 70–99)
Potassium: 3.6 mmol/L (ref 3.5–5.1)
Sodium: 141 mmol/L (ref 135–145)
Total Bilirubin: 0.6 mg/dL (ref 0.3–1.2)
Total Protein: 6 g/dL — ABNORMAL LOW (ref 6.5–8.1)

## 2021-06-01 LAB — CBC
HCT: 37.4 % (ref 36.0–46.0)
Hemoglobin: 12.4 g/dL (ref 12.0–15.0)
MCH: 31.6 pg (ref 26.0–34.0)
MCHC: 33.2 g/dL (ref 30.0–36.0)
MCV: 95.2 fL (ref 80.0–100.0)
Platelets: 397 10*3/uL (ref 150–400)
RBC: 3.93 MIL/uL (ref 3.87–5.11)
RDW: 13.3 % (ref 11.5–15.5)
WBC: 6.6 10*3/uL (ref 4.0–10.5)
nRBC: 0 % (ref 0.0–0.2)

## 2021-06-01 LAB — HIV ANTIBODY (ROUTINE TESTING W REFLEX): HIV Screen 4th Generation wRfx: NONREACTIVE

## 2021-06-01 MED ORDER — POLYETHYLENE GLYCOL 3350 17 G PO PACK: 17.0000 g | PACK | Freq: Every day | ORAL | Status: DC | PRN

## 2021-06-01 MED ORDER — LACTATED RINGERS IV SOLN: INTRAVENOUS | Status: DC

## 2021-06-01 MED ORDER — AMITRIPTYLINE HCL 10 MG PO TABS
20.0000 mg | ORAL_TABLET | Freq: Every day | ORAL | Status: DC
Start: 2021-06-01 — End: 2021-06-03
  Administered 2021-06-01 – 2021-06-02 (×2): 20 mg via ORAL
  Filled 2021-06-01 (×3): qty 2

## 2021-06-01 MED ORDER — CYANOCOBALAMIN 500 MCG PO TABS
500.0000 ug | ORAL_TABLET | Freq: Every day | ORAL | Status: DC
  Administered 2021-06-01 – 2021-06-03 (×2): 500 ug via ORAL
  Filled 2021-06-01 (×3): qty 1

## 2021-06-01 MED ORDER — FOLIC ACID 1 MG PO TABS
1.0000 mg | ORAL_TABLET | Freq: Every day | ORAL | Status: DC
  Administered 2021-06-01 – 2021-06-03 (×2): 1 mg via ORAL
  Filled 2021-06-01 (×3): qty 1

## 2021-06-01 MED ORDER — PIPERACILLIN-TAZOBACTAM 3.375 G IVPB
3.3750 g | Freq: Three times a day (TID) | INTRAVENOUS | Status: DC
  Administered 2021-06-01: 3.375 g via INTRAVENOUS
  Filled 2021-06-01: qty 50

## 2021-06-01 MED ORDER — ADULT MULTIVITAMIN W/MINERALS CH
1.0000 | ORAL_TABLET | Freq: Every day | ORAL | Status: DC
  Administered 2021-06-01 – 2021-06-03 (×2): 1 via ORAL
  Filled 2021-06-01 (×3): qty 1

## 2021-06-01 MED ORDER — TRAMADOL HCL 50 MG PO TABS
50.0000 mg | ORAL_TABLET | Freq: Two times a day (BID) | ORAL | Status: DC | PRN
  Administered 2021-06-02: 50 mg via ORAL
  Filled 2021-06-01: qty 1

## 2021-06-01 MED ORDER — ONDANSETRON HCL 4 MG PO TABS: 4.0000 mg | ORAL_TABLET | Freq: Four times a day (QID) | ORAL | Status: DC | PRN

## 2021-06-01 MED ORDER — ONDANSETRON HCL 4 MG/2ML IJ SOLN: 4.0000 mg | Freq: Four times a day (QID) | INTRAMUSCULAR | Status: DC | PRN

## 2021-06-01 MED ORDER — ACETAMINOPHEN 325 MG PO TABS
650.0000 mg | ORAL_TABLET | Freq: Four times a day (QID) | ORAL | Status: DC | PRN
  Administered 2021-06-01 (×2): 650 mg via ORAL
  Filled 2021-06-01 (×2): qty 2

## 2021-06-01 MED ORDER — ACETAMINOPHEN 650 MG RE SUPP: 650.0000 mg | Freq: Four times a day (QID) | RECTAL | Status: DC | PRN

## 2021-06-01 NOTE — ED Notes (Signed)
Pt ambulated to bathroom 

## 2021-06-01 NOTE — Consult Note (Signed)
Chief Complaint: Patient was seen in consultation today for  Chief Complaint  Patient presents with   Abdominal Pain   at the request of Richard Miu, [place]  Supervising Physician: Arne Cleveland  Patient Status: [facility] - In-pt  History of Present Illness: [name] [name] is a 71 y.o. female with medical history significant of OA, obesity, gastric sleeve, and cholecystectomy who presents with abdominal pain and fever to 100 F.  She reports she has been having some abdominal pain and feeling bloated for the last week or 2 which she attributed to being on a ketogenic diet.   Pain rated as a 6 out of 10 in severity and would wax and wane and would radiate into her back.  She then had a fever to 100 degrees at home and decided to come for evaluation.  She endorses constipation treated with dulcolax, nausea but no vomiting.  She has not had cough, chest pain or palpitations.  She denies urinary symptoms.  Endorses early satiety and less appetite as of recently.   CT of her abdomen showed inflammation of the mesentery and peritoneal surface of her colon with differential diagnosis of infectious cause versus carcinomatosis.  She has been started on Zosyn.  IR has been consulted for biopsy of omentum.   Past Medical History:  Diagnosis Date   Allergy    Anemia    IN THE PAST WITH PREGNANCY   Arthritis    LEFT FOOT; OCCAS LOWER BACK PAIN - HX OF VERTEBRAL FRACTURES   Blood transfusion without reported diagnosis    DURING PREGNANCY   Cataract    removed both eyes    Complication of anesthesia    a [name] sluggist when waking up-no intervention required   Constipation    uses Colace 3 x a day and Dulcolax q week- with this has soft BM's- no pain    Coronary artery spasm (HCC) 1990'S   AFTER OTC DIET PILLS - PT EXPERIENCED CHEST PAIN- PT HAD CARDIAC CATH - TOLD NO CAD - THOUGHT TO HAVE HAS CORNARY ARTERY SPASM   GERD (gastroesophageal reflux disease)    mild    H/O seasonal allergies     Headache(784.0)    OCCAS SINUS HEADACHES   Hearing impaired    LEFT EAR DEAFNESS   Hypertension    220/104 IN SPRING 2015 - SAW HER DOCTOR - BUT B/P NORMALIZED - PT DECIDED TO STOP CAFFEINE AND HAS NOT HAD ANY ELEVATED PRESSURES SINCE JUNE   Osteopenia    Strabismus    HAD STRABISMUS SURGERY 1956 - BUT STILL HAS STRABISMUS   Vitamin D deficiency     Past Surgical History:  Procedure Laterality Date   ABDOMINAL HYSTERECTOMY     BONE ANCHORED HEARING AID IMPLANT Left 01/26/2019   Procedure: BONE ANCHORED HEARING AID (BAHA) IMPLANT;  Surgeon: [name], MD;  Location: [place];  Service: ENT;  Laterality: Left;   BREATH TEK H PYLORI N/A 10/08/2013   Procedure: BREATH TEK H PYLORI;  Surgeon: Gayland Curry, MD;  Location: [facility];  Service: General;  Laterality: N/A;   BREATH TEK H PYLORI N/A 12/03/2013   Procedure: Lauris Chroman;  Surgeon: Gayland Curry, MD;  Location: [facility];  Service: General;  Laterality: N/A;   CHOLECYSTECTOMY N/A 04/13/2016   Procedure: LAPAROSCOPIC CHOLECYSTECTOMY WITH ATTEMPTED INTRAOPERATIVE CHOLANGIOGRAM;  Surgeon: Greer Pickerel, MD;  Location: [place];  Service: General;  Laterality: N/A;   COLONOSCOPY     EYE SURGERY  Bilateral 2017   cataract surgery with lens implant   INNER EAR SURGERY Left [place] GASTRIC SLEEVE RESECTION N/A 12/18/2013   Procedure: LAPAROSCOPIC GASTRIC SLEEVE RESECTION;  Surgeon: Greer Pickerel, MD;  Location: [facility];  Service: General;  Laterality: N/A;   REPAIR TENDONS FOOT Right 1994   AND RECONSTRUCTIVE SURGERY FOR MULTIPLE FRACTURES OF THE RIGHT FOOT   STOMACH SURGERY     strabismus repair     TONSILLECTOMY     UPPER GASTROINTESTINAL ENDOSCOPY      Allergies: Augmentin [amoxicillin-pot clavulanate] and Morphine and related  Medications: Prior to Admission medications   Medication Sig Start Date End Date Taking? Authorizing Provider  amitriptyline (ELAVIL) 10 MG tablet Take 20 mg by  mouth daily. 02/06/21  Yes [provider]  bisacodyl (DULCOLAX) 5 MG EC tablet Take 5 mg by mouth once a week.    Yes [provider]  Calcium 600-200 MG-UNIT tablet Take 1 tablet by mouth daily.   Yes [provider]  diclofenac Sodium (VOLTAREN) 1 % GEL Apply 2 g topically 4 (four) times daily as needed (dry skin, rash). 02/20/19  Yes [provider]  docusate sodium (COLACE) 100 MG capsule Take 100-200 mg by mouth See admin instructions. Take 200 mg in the Morning and 100 mg in the evening   Yes [provider]  meloxicam (MOBIC) 15 MG tablet TAKE 1 TABLET BY MOUTH EVERY DAY Patient taking differently: Take 15 mg by mouth daily as needed for pain. 11/14/18  Yes Lucille Passy, MD  Polyethyl Glycol-Propyl Glycol (SYSTANE) 0.4-0.3 % SOLN Apply 1 drop to eye daily.   Yes [provider]  vitamin B-12 (CYANOCOBALAMIN) 500 MCG tablet Take 500 mcg by mouth daily.   Yes [provider]  Vitamin D, Cholecalciferol, 1000 units TABS Take 1,000 mcg by mouth daily.    Yes [provider]  diltiazem (CARDIZEM) 30 MG tablet TAKE 1 TABLET (30 MG TOTAL) BY MOUTH EVERY 8 (EIGHT) HOURS AS NEEDED (IF HEART RATE IS GREATER THAN 160 AND IF SYSTOLIC BLOOD PRESSURE (TOP NUMBER) IS GREATER THAN 110 MMHG). Patient not taking: Reported on 05/31/2021 05/18/21   Berniece Salines, DO  FERROCITE 324 MG TABS tablet Take 324 mg of iron by mouth every Monday, Wednesday, and Friday. Patient not taking: Reported on 05/31/2021 12/04/20   [provider]  fluticasone (FLONASE) 50 MCG/ACT nasal spray Place 2 sprays into both nostrils as needed. Patient not taking: Reported on 05/31/2021    [provider]  metoprolol tartrate (LOPRESSOR) 100 MG tablet Take 2 hours prior to CT Patient not taking: Reported on 05/31/2021 02/17/21   Tobb, Kardie, DO  nystatin (MYCOSTATIN/NYSTOP) powder Apply topically 2 (two) times daily. Patient not taking: Reported on  05/31/2021 12/13/18   Lucille Passy, MD     Family History  Problem Relation Age of Onset   Hypertension Mother    Diabetes Mother    Obesity Mother    Heart disease Mother    Alcohol abuse Father    Colon cancer Brother 65   COPD Maternal Grandmother    Hyperlipidemia Maternal Grandmother    COPD Paternal Grandfather    Colon polyps Sister    Stomach cancer Neg Hx    Rectal cancer Neg Hx    Esophageal cancer Neg Hx    Pancreatic cancer Neg Hx     Social History   Socioeconomic History   Marital status: Married  Spouse name: Marden Noble   Number of children: 2   Years of education: master's degree   Highest education level: Not on file  Occupational History   Occupation: Programmer, multimedia: [employer]  Tobacco Use   Smoking status: Never   Smokeless tobacco: Never  Vaping Use   Vaping Use: Never used  Substance and Sexual Activity   Alcohol use: No    Alcohol/week: 0.0 standard drinks   Drug use: No   Sexual activity: Yes    Birth control/protection: Surgical  Other Topics Concern   Not on file  Social History Narrative   05/28/19   From: the area x 25 years, originally from [place]: with husband Marden Noble, and youngest son   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      Family: Adam (married, 2 children) and Shanon Brow (living at home) - adult      Enjoys: read, cook, travel, working on Electrical engineer, Financial planner, spending time with grandkids      Exercise: getting back to exercise, trying to treadmill    Diet: meals are healthy, but getting carb cravings      Safety   Seat belts: Yes    Guns: No   Safe in relationships: Yes    Social Determinants of Radio broadcast assistant Strain: Not on file  Food Insecurity: Not on file  Transportation Needs: Not on file  Physical Activity: Not on file  Stress: Not on file  Social Connections: Not on  file    Review of Systems: A 12 point ROS discussed and pertinent positives are indicated in the HPI above.  All other systems are negative.   Vital Signs: BP 112/66 Pulse 87 Resp 16 SpO2 94%  Physical Exam Vitals reviewed.  Constitutional:      Appearance: She is not toxic-appearing.  HENT:     Head: Normocephalic and atraumatic.     Mouth/Throat:     Mouth: Mucous membranes are moist.     Pharynx: Oropharynx is clear.  Cardiovascular:     Rate and Rhythm: Normal rate.  Pulmonary:     Comments: Mildly labored Abdominal:     Palpations: Abdomen is soft.     Tenderness: There is generalized abdominal tenderness.  Skin:    General: Skin is warm and dry.  Neurological:     General: No focal deficit present.     Mental Status: She is alert.  Psychiatric:        Mood and Affect: Mood normal.        Behavior: Behavior normal.    Imaging: CT Abdomen Pelvis W Contrast  Result Date: 05/31/2021 CLINICAL DATA:  Right lower quadrant abdominal pain and fever. Radiating to the back. EXAM: CT ABDOMEN AND PELVIS WITH CONTRAST TECHNIQUE: Multidetector CT imaging of the abdomen and pelvis was performed using the standard protocol following bolus administration of intravenous contrast. RADIATION DOSE REDUCTION: This exam was performed according to the departmental dose-optimization program which includes automated exposure control, adjustment of the mA and/or kV according to patient size and/or use of iterative reconstruction technique. CONTRAST:  165mL OMNIPAQUE IOHEXOL 350 MG/ML SOLN COMPARISON:  04/01/2018 FINDINGS: Lower chest: Mild scarring in the medial right middle lobe and lingula. Hepatobiliary: Liver parenchyma is normal. Previous cholecystectomy. Mild biliary ductal prominence, typical following cholecystectomy and similar to the study 2019. Pancreas:  Normal Spleen: Normal Adrenals/Urinary Tract: Adrenal glands are normal. Kidneys are normal. Bladder is normal. Stomach/Bowel: Previous  gastric surgery. I do not see evidence of small-bowel obstruction or large bowel obstruction. The appendix is normal. There is an abnormal appearance of the mesentery worrisome for peritoneal carcinomatosis. There is free fluid in the pelvis with slight nodularity of the peritoneal surfaces. I cannot be sure the source of this process. I might question the mid transverse colon. One could also consider infectious peritonitis causing this appearance. Vascular/Lymphatic: Aorta and IVC are normal.  No adenopathy. Reproductive: Previous hysterectomy.  No evidence of ovarian mass. Other: No free air.  No hernia. Musculoskeletal: Old superior endplate compression deformity of T12. IMPRESSION: Abnormal appearance of the mesentery, free intraperitoneal fluid, and slightly thickened appearance of the peritoneal surfaces in the pelvis suggesting peritoneal carcinomatosis. Conceivably, infectious peritonitis could have this appearance as well, but peritoneal carcinomatosis is favored. I do not see a definite site of origin. I wonder about the mid transverse colon, but this is not definite. Previous cholecystectomy.  Previous hysterectomy. Electronically Signed   By: Nelson Chimes M.D.   On: 05/31/2021 22:21   DG Chest Port 1 View  Result Date: 05/31/2021 CLINICAL DATA:  Upper abdominal pain EXAM: PORTABLE CHEST 1 VIEW COMPARISON:  01/09/2021 FINDINGS: The heart size and mediastinal contours are within normal limits. Both lungs are clear. The visualized skeletal structures are unremarkable. IMPRESSION: No active disease. Electronically Signed   By: Fidela Salisbury M.D.   On: 05/31/2021 21:02    Labs:  CBC: Recent Labs    01/09/21 2011 05/31/21 2033 06/01/21 0618  WBC 9.8 12.1* 6.6  HGB 12.5 13.4 12.4  HCT 39.0 41.3 37.4  PLT 355 440* 397    BMP: Recent Labs    01/09/21 2011 02/17/21 1436 05/31/21 2033 06/01/21 0618  NA 139 142 140 141  K 3.8 4.1 4.1 3.6  CL 104 102 101 106  CO2 25 24 26 25   GLUCOSE  94 84 94 96  BUN 24* 22 21 11   CALCIUM 9.1 9.2 9.3 8.5*  CREATININE 0.84 0.62 0.71 0.64  GFRNONAA >60  --  >60 >60    LIVER FUNCTION TESTS: Recent Labs    05/31/21 2033 06/01/21 0618  BILITOT 0.5 0.6  AST 25 19  ALT 14 13  ALKPHOS 77 61  PROT 6.8 6.0*  ALBUMIN 4.0 3.6    Assessment and Plan:  Carcinomatosis  --vs infectious process  --for CT omental biopsy  --not NPO, so for tomorrow   Risks and benefits of omental biopsy was discussed with the patient and/or patient's family including, but not limited to bleeding, infection, damage to adjacent structures or low yield requiring additional tests.  All of the questions were answered and there is agreement to proceed.  Consent signed and in chart.   Thank you for this interesting consult.  I greatly enjoyed meeting Dejae Bernet and look forward to participating in their care.  A copy of this report was sent to the requesting provider on this date.  Electronically Signed: Pasty Spillers, PA 06/01/2021, 2:06 PM   I spent a total of 40 Minutes in face to face in clinical consultation, greater than 50% of which was counseling/coordinating care for biopsy of the omentum.

## 2021-06-01 NOTE — ED Notes (Signed)
Gen. Surg. Paged @ 13:19

## 2021-06-01 NOTE — Progress Notes (Signed)
PROGRESS NOTE    [name] [name]  DTO:[id number] DOB: 07-20-1950 DOA: 05/31/2021 PCP: Margretta Sidle, MD   Brief Narrative:  [name] [name] is a 71 y.o. female with medical history significant of OA, obesity who presents with abdominal pain and fever to 100 F.  She reports she has been having some abdominal pain and feeling bloated for the last week or 2 which she attributed to being on a ketogenic diet.  She reports that this morning pain became worse that she rated as a 6 out of 10 in severity and would wax and wane and would radiate into her back.  She then had a fever to 100 degrees at home but decided come for evaluation.  She reports she has been having constipation for the last week and has used over-the-counter laxatives.  She also was nauseous this afternoon but no vomiting.  She has not had cough, chest pain or palpitations.  She denies any urinary symptoms.  She developed a fever she was concerned of appendicitis.  She has had a cholecystectomy and a gastric sleeve.  She has never had bowel obstruction.  She reports that her appetite has been less recently and that she feels full quickly which she attributed to eating a higher content of proteins with her meals. She has been hemodynamically stable in the emergency room.  CT of her abdomen showed inflammation of the mesentery and peritoneal surface of her colon with differential diagnosis of infectious cause versus carcinomatosis.  Radiology favors the carcinomatosis.  Surgeries been consulted by the ER physician and will evaluate.  She is started on antibiotic therapy with Zosyn  Assessment & Plan:   Principal Problem:   Carcinomatosis ([place]) Active Problems:   Abdominal pain   Leukocytosis  Abdominal carcinomatosis: CT abdomen shows these findings.  Primary source is unknown, no mass seen.  General surgery consulted.  They have offered the patient for laparoscopic biopsy.  I will order CEA, CA 19 9 and CA125.  Often  carcinomatosis is common and ovarian cancer in female population.  Patient is doing well with no complaints, no abdominal pain currently.  Depression: Resume amitriptyline.  Morbid obesity: Weight loss counseled.  Leukocytosis: Resolved.  No indication of infection.  We will discontinue Zosyn.  DVT prophylaxis: SCDs Start: 06/01/21 0537   Code Status: Full Code  Family Communication:  None present at bedside.  Plan of care discussed with patient in length and he/she verbalized understanding and agreed with it.  Status is: Inpatient Remains inpatient appropriate because: Needs further work-up.  Estimated body mass index is 35.53 kg/m as calculated from the following:   Height as of 02/17/21: 4\' 10"  (1.473 m).   Weight as of 02/17/21: 77.1 kg.    Nutritional Assessment: There is no height or weight on file to calculate BMI.. Seen by dietician.  I agree with the assessment and plan as outlined below: Nutrition Status:        . Skin Assessment: I have examined the patient's skin and I agree with the wound assessment as performed by the wound care RN as outlined below:    Consultants:  General surgery  Procedures:  None  Antimicrobials:  Anti-infectives (From admission, onward)    Start     Dose/Rate Route Frequency Ordered Stop   06/01/21 0600  piperacillin-tazobactam (ZOSYN) IVPB 3.375 g        3.375 g 12.5 mL/hr over 240 Minutes Intravenous Every 8 hours 06/01/21 0536     05/31/21 2315  piperacillin-tazobactam (ZOSYN) IVPB 3.375 g        3.375 g 100 mL/hr over 30 Minutes Intravenous  Once 05/31/21 2311 06/01/21 0033         Subjective: Seen and examined.  She is doing much better with no abdominal pain now.  Objective: Vitals:   06/01/21 0930 06/01/21 1000 06/01/21 1100 06/01/21 1200  BP: 117/61 109/72 117/76 121/73  Pulse: 83 85 80 79  Resp: 16 16 16 16   Temp:      TempSrc:      SpO2: 93% 93% 94% 92%    Intake/Output Summary (Last 24 hours) at  06/01/2021 1232 Last data filed at 06/01/2021 1216 Gross per 24 hour  Intake 50 ml  Output --  Net 50 ml   There were no vitals filed for this visit.  Examination:  General exam: Appears calm and comfortable, morbidly obese Respiratory system: Clear to auscultation. Respiratory effort normal. Cardiovascular system: S1 & S2 heard, RRR. No JVD, murmurs, rubs, gallops or clicks. No pedal edema. Gastrointestinal system: Abdomen is nondistended, soft and nontender. No organomegaly or masses felt. Normal bowel sounds heard. Central nervous system: Alert and oriented. No focal neurological deficits. Extremities: Symmetric 5 x 5 power. Skin: No rashes, lesions or ulcers Psychiatry: Judgement and insight appear normal. Mood & affect appropriate.    Data Reviewed: I have personally reviewed following labs and imaging studies  CBC: Recent Labs  Lab 05/31/21 2033 06/01/21 0618  WBC 12.1* 6.6  NEUTROABS 7.4  --   HGB 13.4 12.4  HCT 41.3 37.4  MCV 96.5 95.2  PLT 440* 867   Basic Metabolic Panel: Recent Labs  Lab 05/31/21 2033 06/01/21 0618  NA 140 141  K 4.1 3.6  CL 101 106  CO2 26 25  GLUCOSE 94 96  BUN 21 11  CREATININE 0.71 0.64  CALCIUM 9.3 8.5*   GFR: CrCl cannot be calculated (Unknown ideal weight.). Liver Function Tests: Recent Labs  Lab 05/31/21 2033 06/01/21 0618  AST 25 19  ALT 14 13  ALKPHOS 77 61  BILITOT 0.5 0.6  PROT 6.8 6.0*  ALBUMIN 4.0 3.6   Recent Labs  Lab 05/31/21 2033  LIPASE 51   No results for input(s): AMMONIA in the last 168 hours. Coagulation Profile: No results for input(s): INR, PROTIME in the last 168 hours. Cardiac Enzymes: No results for input(s): CKTOTAL, CKMB, CKMBINDEX, TROPONINI in the last 168 hours. BNP (last 3 results) No results for input(s): PROBNP in the last 8760 hours. HbA1C: No results for input(s): HGBA1C in the last 72 hours. CBG: No results for input(s): GLUCAP in the last 168 hours. Lipid Profile: No  results for input(s): CHOL, HDL, LDLCALC, TRIG, CHOLHDL, LDLDIRECT in the last 72 hours. Thyroid Function Tests: No results for input(s): TSH, T4TOTAL, FREET4, T3FREE, THYROIDAB in the last 72 hours. Anemia Panel: No results for input(s): VITAMINB12, FOLATE, FERRITIN, TIBC, IRON, RETICCTPCT in the last 72 hours. Sepsis Labs: Recent Labs  Lab 05/31/21 2031  LATICACIDVEN 1.4    Recent Results (from the past 240 hour(s))  Resp Panel by RT-PCR (Flu A&B, Covid) Nasopharyngeal Swab     Status: None   Collection Time: 05/31/21 10:30 PM   Specimen: Nasopharyngeal Swab; Nasopharyngeal(NP) swabs in vial transport medium  Result Value Ref Range Status   SARS Coronavirus 2 by RT PCR NEGATIVE NEGATIVE Final    Comment: (NOTE) SARS-CoV-2 target nucleic acids are NOT DETECTED.  The SARS-CoV-2 RNA is generally detectable in upper respiratory specimens  during the acute phase of infection. The lowest concentration of SARS-CoV-2 viral copies this assay can detect is 138 copies/mL. A negative result does not preclude SARS-Cov-2 infection and should not be used as the sole basis for treatment or other patient management decisions. A negative result may occur with  improper specimen collection/handling, submission of specimen other than nasopharyngeal swab, presence of viral mutation(s) within the areas targeted by this assay, and inadequate number of viral copies(<138 copies/mL). A negative result must be combined with clinical observations, patient history, and epidemiological information. The expected result is Negative.  Fact Sheet for Patients:  EntrepreneurPulse.com.au  Fact Sheet for Healthcare Providers:  IncredibleEmployment.be  This test is no t yet approved or cleared by the [place] FDA and  has been authorized for detection and/or diagnosis of SARS-CoV-2 by FDA under an Emergency Use Authorization (EUA). This EUA will remain  in effect  (meaning this test can be used) for the duration of the COVID-19 declaration under Section 564(b)(1) of the Act, 21 U.S.C.section 360bbb-3(b)(1), unless the authorization is terminated  or revoked sooner.       Influenza A by PCR NEGATIVE NEGATIVE Final   Influenza B by PCR NEGATIVE NEGATIVE Final    Comment: (NOTE) The Xpert Xpress SARS-CoV-2/FLU/RSV plus assay is intended as an aid in the diagnosis of influenza from Nasopharyngeal swab specimens and should not be used as a sole basis for treatment. Nasal washings and aspirates are unacceptable for Xpert Xpress SARS-CoV-2/FLU/RSV testing.  Fact Sheet for Patients: EntrepreneurPulse.com.au  Fact Sheet for Healthcare Providers: IncredibleEmployment.be  This test is not yet approved or cleared by the [place] FDA and has been authorized for detection and/or diagnosis of SARS-CoV-2 by FDA under an Emergency Use Authorization (EUA). This EUA will remain in effect (meaning this test can be used) for the duration of the COVID-19 declaration under Section 564(b)(1) of the Act, 21 U.S.C. section 360bbb-3(b)(1), unless the authorization is terminated or revoked.  Performed at [facility], [place] [address], [place], [place] [address]      Radiology Studies: CT Abdomen Pelvis W Contrast  Result Date: 05/31/2021 CLINICAL DATA:  Right lower quadrant abdominal pain and fever. Radiating to the back. EXAM: CT ABDOMEN AND PELVIS WITH CONTRAST TECHNIQUE: Multidetector CT imaging of the abdomen and pelvis was performed using the standard protocol following bolus administration of intravenous contrast. RADIATION DOSE REDUCTION: This exam was performed according to the departmental dose-optimization program which includes automated exposure control, adjustment of the mA and/or kV according to patient size and/or use of iterative reconstruction technique. CONTRAST:  150mL OMNIPAQUE IOHEXOL 350 MG/ML SOLN  COMPARISON:  04/01/2018 FINDINGS: Lower chest: Mild scarring in the medial right middle lobe and lingula. Hepatobiliary: Liver parenchyma is normal. Previous cholecystectomy. Mild biliary ductal prominence, typical following cholecystectomy and similar to the study 2019. Pancreas: Normal Spleen: Normal Adrenals/Urinary Tract: Adrenal glands are normal. Kidneys are normal. Bladder is normal. Stomach/Bowel: Previous gastric surgery. I do not see evidence of small-bowel obstruction or large bowel obstruction. The appendix is normal. There is an abnormal appearance of the mesentery worrisome for peritoneal carcinomatosis. There is free fluid in the pelvis with slight nodularity of the peritoneal surfaces. I cannot be sure the source of this process. I might question the mid transverse colon. One could also consider infectious peritonitis causing this appearance. Vascular/Lymphatic: Aorta and IVC are normal.  No adenopathy. Reproductive: Previous hysterectomy.  No evidence of ovarian mass. Other: No free air.  No hernia. Musculoskeletal:  Old superior endplate compression deformity of T12. IMPRESSION: Abnormal appearance of the mesentery, free intraperitoneal fluid, and slightly thickened appearance of the peritoneal surfaces in the pelvis suggesting peritoneal carcinomatosis. Conceivably, infectious peritonitis could have this appearance as well, but peritoneal carcinomatosis is favored. I do not see a definite site of origin. I wonder about the mid transverse colon, but this is not definite. Previous cholecystectomy.  Previous hysterectomy. Electronically Signed   By: Nelson Chimes M.D.   On: 05/31/2021 22:21   DG Chest Port 1 View  Result Date: 05/31/2021 CLINICAL DATA:  Upper abdominal pain EXAM: PORTABLE CHEST 1 VIEW COMPARISON:  01/09/2021 FINDINGS: The heart size and mediastinal contours are within normal limits. Both lungs are clear. The visualized skeletal structures are unremarkable. IMPRESSION: No active  disease. Electronically Signed   By: Fidela Salisbury M.D.   On: 05/31/2021 21:02    Scheduled Meds:  folic acid  1 mg Oral Daily   multivitamin with minerals  1 tablet Oral Daily   Continuous Infusions:  lactated ringers 100 mL/hr at 06/01/21 0709   piperacillin-tazobactam (ZOSYN)  IV Stopped (06/01/21 1216)     LOS: 1 day   Time spent: 31 minutes  Darliss Cheney, MD [facility]  06/01/2021, 12:32 PM  Please page via [place] and do not message via secure chat for urgent patient care matters. Secure chat can be used for non urgent patient care matters.  How to contact the [facility] Attending or Consulting provider [place] or covering provider during after hours [place], for this patient?  Check the care team in [facility] and look for a) attending/consulting [facility] provider listed and b) the [facility] team listed. Page or secure chat [phone]. Log into [email or web address] and use [place]'s universal password to access. If you do not have the password, please contact the hospital operator. Locate the [facility] provider you are looking for under [facility] and page to a number that you can be directly reached. If you still have difficulty reaching the provider, please page the [facility] (Director on Call) for the Hospitalists listed on amion for assistance.

## 2021-06-02 ENCOUNTER — Inpatient Hospital Stay (HOSPITAL_COMMUNITY): Payer: Medicare PPO

## 2021-06-02 LAB — CEA: CEA: 1.2 ng/mL (ref 0.0–4.7)

## 2021-06-02 LAB — CA 125: Cancer Antigen (CA) 125: 147 U/mL — ABNORMAL HIGH (ref 0.0–38.1)

## 2021-06-02 LAB — CANCER ANTIGEN 19-9: CA 19-9: 41 U/mL — ABNORMAL HIGH (ref 0–35)

## 2021-06-02 MED ORDER — BISACODYL 5 MG PO TBEC: 5.0000 mg | DELAYED_RELEASE_TABLET | Freq: Every day | ORAL | Status: DC | PRN

## 2021-06-02 MED ORDER — DOCUSATE SODIUM 100 MG PO CAPS
100.0000 mg | ORAL_CAPSULE | Freq: Two times a day (BID) | ORAL | Status: DC
  Administered 2021-06-02 – 2021-06-03 (×2): 100 mg via ORAL
  Filled 2021-06-02 (×2): qty 1

## 2021-06-02 MED ORDER — LIDOCAINE HCL 1 % IJ SOLN
INTRAMUSCULAR | Status: AC
Start: 2021-06-02 — End: 2021-06-03
  Filled 2021-06-02: qty 10

## 2021-06-02 MED ORDER — FENTANYL CITRATE (PF) 100 MCG/2ML IJ SOLN
INTRAMUSCULAR | Status: AC
  Filled 2021-06-02: qty 2

## 2021-06-02 MED ORDER — FENTANYL CITRATE (PF) 100 MCG/2ML IJ SOLN
INTRAMUSCULAR | Status: AC | PRN
Start: 2021-06-02 — End: 2021-06-02
  Administered 2021-06-02: 50 ug via INTRAVENOUS

## 2021-06-02 MED ORDER — MIDAZOLAM HCL 2 MG/2ML IJ SOLN
INTRAMUSCULAR | Status: AC
  Filled 2021-06-02: qty 2

## 2021-06-02 MED ORDER — MIDAZOLAM HCL 2 MG/2ML IJ SOLN
INTRAMUSCULAR | Status: AC | PRN
  Administered 2021-06-02: 1 mg via INTRAVENOUS

## 2021-06-02 NOTE — Sedation Documentation (Signed)
Patient is resting comfortably. Procedure started °

## 2021-06-02 NOTE — Progress Notes (Signed)
PROGRESS NOTE    [name] [name]  VVO:[id number] DOB: April 09, 1950 DOA: 05/31/2021 PCP: Margretta Sidle, MD   Brief Narrative:  [name] [name] is a 71 y.o. female with medical history significant of OA, obesity who presents with abdominal pain and fever to 100 F.  She reports she has been having some abdominal pain and feeling bloated for the last week or 2 which she attributed to being on a ketogenic diet.  She reports that this morning pain became worse that she rated as a 6 out of 10 in severity and would wax and wane and would radiate into her back.  She then had a fever to 100 degrees at home but decided come for evaluation.  She reports she has been having constipation for the last week and has used over-the-counter laxatives.  She also was nauseous this afternoon but no vomiting.  She has not had cough, chest pain or palpitations.  She denies any urinary symptoms.  She developed a fever she was concerned of appendicitis.  She has had a cholecystectomy and a gastric sleeve.  She has never had bowel obstruction.  She reports that her appetite has been less recently and that she feels full quickly which she attributed to eating a higher content of proteins with her meals. She has been hemodynamically stable in the emergency room.  CT of her abdomen showed inflammation of the mesentery and peritoneal surface of her colon with differential diagnosis of infectious cause versus carcinomatosis.  Radiology favors the carcinomatosis.  Surgeries been consulted by the ER physician and will evaluate.  She is started on antibiotic therapy with Zosyn  Assessment & Plan:   Principal Problem:   Carcinomatosis ([place]) Active Problems:   Abdominal pain   Leukocytosis  Abdominal carcinomatosis: CT abdomen shows these findings.  Primary source is unknown, no mass seen.  General surgery consulted.  They had initially offered the patient for laparoscopic biopsy however they consulted IR to try CT-guided  biopsy first.  I had ordered CEA, CA 19 9 and CA125 presuming that often carcinomatosis is common and ovarian cancer in female population.  To my suspicion, patient's CA125 is significantly elevated indicating possible ovarian cancer.  CEA normal and CA 19-9 slightly elevated which is insignificant.  I have ordered pelvic ultrasound to look for pelvic masses.  Depression: Resume amitriptyline.  Morbid obesity: Weight loss counseled.  Leukocytosis: Resolved.  No indication of infection.  We will discontinue Zosyn.  DVT prophylaxis: SCDs Start: 06/01/21 0537   Code Status: Full Code  Family Communication: 2 sons present the bedside.  Status is: Inpatient Remains inpatient appropriate because: Needs further work-up.  Estimated body mass index is 35.53 kg/m as calculated from the following:   Height as of 02/17/21: 4\' 10"  (1.473 m).   Weight as of 02/17/21: 77.1 kg.    Nutritional Assessment: There is no height or weight on file to calculate BMI.. Seen by dietician.  I agree with the assessment and plan as outlined below: Nutrition Status:        . Skin Assessment: I have examined the patient's skin and I agree with the wound assessment as performed by the wound care RN as outlined below:    Consultants:  General surgery  Procedures:  None  Antimicrobials:  Anti-infectives (From admission, onward)    Start     Dose/Rate Route Frequency Ordered Stop   06/01/21 0600  piperacillin-tazobactam (ZOSYN) IVPB 3.375 g  Status:  Discontinued  3.375 g 12.5 mL/hr over 240 Minutes Intravenous Every 8 hours 06/01/21 0536 06/01/21 1234   05/31/21 2315  piperacillin-tazobactam (ZOSYN) IVPB 3.375 g        3.375 g 100 mL/hr over 30 Minutes Intravenous  Once 05/31/21 2311 06/01/21 0033         Subjective: Seen and examined.  She has no complaint.  No abdominal pain.  She appears to be motivated to seek medical treatment if it were to be a cancer.  Objective: Vitals:    06/01/21 1748 06/01/21 1943 06/02/21 0327 06/02/21 0742  BP: 140/78 (!) 120/58 133/60 135/66  Pulse: 84 88 78 81  Resp: 18 18 16 16   Temp: 98 F (36.7 C) 98.4 F (36.9 C) 98 F (36.7 C) 98.2 F (36.8 C)  TempSrc:  Oral Oral Oral  SpO2: 99% 96% 95% 97%    Intake/Output Summary (Last 24 hours) at 06/02/2021 1025 Last data filed at 06/02/2021 1610 Gross per 24 hour  Intake 2554.47 ml  Output --  Net 2554.47 ml    There were no vitals filed for this visit.  Examination:  General exam: Appears calm and comfortable, obese Respiratory system: Clear to auscultation. Respiratory effort normal. Cardiovascular system: S1 & S2 heard, RRR. No JVD, murmurs, rubs, gallops or clicks. No pedal edema. Gastrointestinal system: Abdomen is nondistended, soft and nontender. No organomegaly or masses felt. Normal bowel sounds heard. Central nervous system: Alert and oriented. No focal neurological deficits. Extremities: Symmetric 5 x 5 power. Skin: No rashes, lesions or ulcers.  Psychiatry: Judgement and insight appear normal. Mood & affect appropriate.   Data Reviewed: I have personally reviewed following labs and imaging studies  CBC: Recent Labs  Lab 05/31/21 2033 06/01/21 0618  WBC 12.1* 6.6  NEUTROABS 7.4  --   HGB 13.4 12.4  HCT 41.3 37.4  MCV 96.5 95.2  PLT 440* 960    Basic Metabolic Panel: Recent Labs  Lab 05/31/21 2033 06/01/21 0618  NA 140 141  K 4.1 3.6  CL 101 106  CO2 26 25  GLUCOSE 94 96  BUN 21 11  CREATININE 0.71 0.64  CALCIUM 9.3 8.5*    GFR: CrCl cannot be calculated (Unknown ideal weight.). Liver Function Tests: Recent Labs  Lab 05/31/21 2033 06/01/21 0618  AST 25 19  ALT 14 13  ALKPHOS 77 61  BILITOT 0.5 0.6  PROT 6.8 6.0*  ALBUMIN 4.0 3.6    Recent Labs  Lab 05/31/21 2033  LIPASE 51    No results for input(s): AMMONIA in the last 168 hours. Coagulation Profile: No results for input(s): INR, PROTIME in the last 168 hours. Cardiac  Enzymes: No results for input(s): CKTOTAL, CKMB, CKMBINDEX, TROPONINI in the last 168 hours. BNP (last 3 results) No results for input(s): PROBNP in the last 8760 hours. HbA1C: No results for input(s): HGBA1C in the last 72 hours. CBG: No results for input(s): GLUCAP in the last 168 hours. Lipid Profile: No results for input(s): CHOL, HDL, LDLCALC, TRIG, CHOLHDL, LDLDIRECT in the last 72 hours. Thyroid Function Tests: No results for input(s): TSH, T4TOTAL, FREET4, T3FREE, THYROIDAB in the last 72 hours. Anemia Panel: No results for input(s): VITAMINB12, FOLATE, FERRITIN, TIBC, IRON, RETICCTPCT in the last 72 hours. Sepsis Labs: Recent Labs  Lab 05/31/21 2031  LATICACIDVEN 1.4     Recent Results (from the past 240 hour(s))  Resp Panel by RT-PCR (Flu A&B, Covid) Nasopharyngeal Swab     Status: None   Collection Time: 05/31/21  10:30 PM   Specimen: Nasopharyngeal Swab; Nasopharyngeal(NP) swabs in vial transport medium  Result Value Ref Range Status   SARS Coronavirus 2 by RT PCR NEGATIVE NEGATIVE Final    Comment: (NOTE) SARS-CoV-2 target nucleic acids are NOT DETECTED.  The SARS-CoV-2 RNA is generally detectable in upper respiratory specimens during the acute phase of infection. The lowest concentration of SARS-CoV-2 viral copies this assay can detect is 138 copies/mL. A negative result does not preclude SARS-Cov-2 infection and should not be used as the sole basis for treatment or other patient management decisions. A negative result may occur with  improper specimen collection/handling, submission of specimen other than nasopharyngeal swab, presence of viral mutation(s) within the areas targeted by this assay, and inadequate number of viral copies(<138 copies/mL). A negative result must be combined with clinical observations, patient history, and epidemiological information. The expected result is Negative.  Fact Sheet for Patients:   EntrepreneurPulse.com.au  Fact Sheet for Healthcare Providers:  IncredibleEmployment.be  This test is no t yet approved or cleared by the [place] FDA and  has been authorized for detection and/or diagnosis of SARS-CoV-2 by FDA under an Emergency Use Authorization (EUA). This EUA will remain  in effect (meaning this test can be used) for the duration of the COVID-19 declaration under Section 564(b)(1) of the Act, 21 U.S.C.section 360bbb-3(b)(1), unless the authorization is terminated  or revoked sooner.       Influenza A by PCR NEGATIVE NEGATIVE Final   Influenza B by PCR NEGATIVE NEGATIVE Final    Comment: (NOTE) The Xpert Xpress SARS-CoV-2/FLU/RSV plus assay is intended as an aid in the diagnosis of influenza from Nasopharyngeal swab specimens and should not be used as a sole basis for treatment. Nasal washings and aspirates are unacceptable for Xpert Xpress SARS-CoV-2/FLU/RSV testing.  Fact Sheet for Patients: EntrepreneurPulse.com.au  Fact Sheet for Healthcare Providers: IncredibleEmployment.be  This test is not yet approved or cleared by the [place] FDA and has been authorized for detection and/or diagnosis of SARS-CoV-2 by FDA under an Emergency Use Authorization (EUA). This EUA will remain in effect (meaning this test can be used) for the duration of the COVID-19 declaration under Section 564(b)(1) of the Act, 21 U.S.C. section 360bbb-3(b)(1), unless the authorization is terminated or revoked.  Performed at [facility], [place] [address] [place], [place] [address]       Radiology Studies: CT Abdomen Pelvis W Contrast  Result Date: 05/31/2021 CLINICAL DATA:  Right lower quadrant abdominal pain and fever. Radiating to the back. EXAM: CT ABDOMEN AND PELVIS WITH CONTRAST TECHNIQUE: Multidetector CT imaging of the abdomen and pelvis was performed using the standard protocol  following bolus administration of intravenous contrast. RADIATION DOSE REDUCTION: This exam was performed according to the departmental dose-optimization program which includes automated exposure control, adjustment of the mA and/or kV according to patient size and/or use of iterative reconstruction technique. CONTRAST:  119mL OMNIPAQUE IOHEXOL 350 MG/ML SOLN COMPARISON:  04/01/2018 FINDINGS: Lower chest: Mild scarring in the medial right middle lobe and lingula. Hepatobiliary: Liver parenchyma is normal. Previous cholecystectomy. Mild biliary ductal prominence, typical following cholecystectomy and similar to the study 2019. Pancreas: Normal Spleen: Normal Adrenals/Urinary Tract: Adrenal glands are normal. Kidneys are normal. Bladder is normal. Stomach/Bowel: Previous gastric surgery. I do not see evidence of small-bowel obstruction or large bowel obstruction. The appendix is normal. There is an abnormal appearance of the mesentery worrisome for peritoneal carcinomatosis. There is free fluid in the pelvis with slight nodularity of  the peritoneal surfaces. I cannot be sure the source of this process. I might question the mid transverse colon. One could also consider infectious peritonitis causing this appearance. Vascular/Lymphatic: Aorta and IVC are normal.  No adenopathy. Reproductive: Previous hysterectomy.  No evidence of ovarian mass. Other: No free air.  No hernia. Musculoskeletal: Old superior endplate compression deformity of T12. IMPRESSION: Abnormal appearance of the mesentery, free intraperitoneal fluid, and slightly thickened appearance of the peritoneal surfaces in the pelvis suggesting peritoneal carcinomatosis. Conceivably, infectious peritonitis could have this appearance as well, but peritoneal carcinomatosis is favored. I do not see a definite site of origin. I wonder about the mid transverse colon, but this is not definite. Previous cholecystectomy.  Previous hysterectomy. Electronically Signed    By: Nelson Chimes M.D.   On: 05/31/2021 22:21   DG Chest Port 1 View  Result Date: 05/31/2021 CLINICAL DATA:  Upper abdominal pain EXAM: PORTABLE CHEST 1 VIEW COMPARISON:  01/09/2021 FINDINGS: The heart size and mediastinal contours are within normal limits. Both lungs are clear. The visualized skeletal structures are unremarkable. IMPRESSION: No active disease. Electronically Signed   By: Fidela Salisbury M.D.   On: 05/31/2021 21:02    Scheduled Meds:  amitriptyline  20 mg Oral Daily   docusate sodium  100 mg Oral BID   folic acid  1 mg Oral Daily   multivitamin with minerals  1 tablet Oral Daily   vitamin B-12  500 mcg Oral Daily   Continuous Infusions:  lactated ringers 100 mL/hr at 06/02/21 6226     LOS: 2 days   Time spent: 29 minutes  Darliss Cheney, MD [facility]  06/02/2021, 10:25 AM  Please page via [facility] and do not message via secure chat for urgent patient care matters. Secure chat can be used for non urgent patient care matters.  How to contact the [facility] Attending or Consulting provider [place] or covering provider during after hours [place], for this patient?  Check the care team in [facility] and look for a) attending/consulting [facility] provider listed and b) the [facility] team listed. Page or secure chat [phone]. Log into [email or web address] and use [place]'s universal password to access. If you do not have the password, please contact the hospital operator. Locate the [facility] provider you are looking for under [facility] and page to a number that you can be directly reached. If you still have difficulty reaching the provider, please page the [facility] (Director on Call) for the Hospitalists listed on amion for assistance.

## 2021-06-02 NOTE — Sedation Documentation (Signed)
Patient is resting comfortably. 

## 2021-06-02 NOTE — Procedures (Signed)
Interventional Radiology Procedure Note  Procedure: CT guided omental mass biopsy  Findings: Please refer to procedural dictation for full description. 18 ga core x 4.  Complications: None immediate  Estimated Blood Loss: < 5 mL  Recommendations: 2 hours bedrest. Follow up Pathology results.   Ruthann Cancer, MD

## 2021-06-02 NOTE — Progress Notes (Signed)
Progress Note     Subjective: Pt reports less abdominal pain and less bloating. Passing flatus but no BM. She reports she always had issues with constipation and does not have a BM without stool softeners and dulcolax at this point. She denies nausea or vomiting this AM. Husband at bedside this AM.   Objective: Vital signs in last 24 hours: Temp:  [97.8 F (36.6 C)-98.4 F (36.9 C)] 98.2 F (36.8 C) (02/28 0742) Pulse Rate:  [16-88] 81 (02/28 0742) Resp:  [16-18] 16 (02/28 0742) BP: (109-140)/(58-78) 135/66 (02/28 0742) SpO2:  [92 %-99 %] 97 % (02/28 0742) Last BM Date : 05/31/21  Intake/Output from previous day: 02/27 0701 - 02/28 0700 In: 2554.5 [P.O.:240; I.V.:2264.5; IV Piggyback:50] Out: -  Intake/Output this shift: No intake/output data recorded.  PE: General: pleasant, WD, obese female who is sitting up in NAD HEENT: sclera anicteric, glasses present  Heart: regular, rate, and rhythm.   Lungs: Respiratory effort nonlabored Abd: soft, NT, ND, +BS, no masses, hernias, or organomegaly Psych: A&Ox3 with an appropriate affect.    Lab Results:  Recent Labs    05/31/21 2033 06/01/21 0618  WBC 12.1* 6.6  HGB 13.4 12.4  HCT 41.3 37.4  PLT 440* 397   BMET Recent Labs    05/31/21 2033 06/01/21 0618  NA 140 141  K 4.1 3.6  CL 101 106  CO2 26 25  GLUCOSE 94 96  BUN 21 11  CREATININE 0.71 0.64  CALCIUM 9.3 8.5*   PT/INR No results for input(s): LABPROT, INR in the last 72 hours. CMP     Component Value Date/Time   NA 141 06/01/2021 0618   NA 142 02/17/2021 1436   K 3.6 06/01/2021 0618   CL 106 06/01/2021 0618   CO2 25 06/01/2021 0618   GLUCOSE 96 06/01/2021 0618   BUN 11 06/01/2021 0618   BUN 22 02/17/2021 1436   CREATININE 0.64 06/01/2021 0618   CREATININE 0.71 09/15/2018 1554   CALCIUM 8.5 (L) 06/01/2021 0618   PROT 6.0 (L) 06/01/2021 0618   ALBUMIN 3.6 06/01/2021 0618   AST 19 06/01/2021 0618   ALT 13 06/01/2021 0618   ALKPHOS 61  06/01/2021 0618   BILITOT 0.6 06/01/2021 0618   GFRNONAA >60 06/01/2021 0618   GFRAA >60 01/18/2019 1417   Lipase     Component Value Date/Time   LIPASE 51 05/31/2021 2033       Studies/Results: CT Abdomen Pelvis W Contrast  Result Date: 05/31/2021 CLINICAL DATA:  Right lower quadrant abdominal pain and fever. Radiating to the back. EXAM: CT ABDOMEN AND PELVIS WITH CONTRAST TECHNIQUE: Multidetector CT imaging of the abdomen and pelvis was performed using the standard protocol following bolus administration of intravenous contrast. RADIATION DOSE REDUCTION: This exam was performed according to the departmental dose-optimization program which includes automated exposure control, adjustment of the mA and/or kV according to patient size and/or use of iterative reconstruction technique. CONTRAST:  168mL OMNIPAQUE IOHEXOL 350 MG/ML SOLN COMPARISON:  04/01/2018 FINDINGS: Lower chest: Mild scarring in the medial right middle lobe and lingula. Hepatobiliary: Liver parenchyma is normal. Previous cholecystectomy. Mild biliary ductal prominence, typical following cholecystectomy and similar to the study 2019. Pancreas: Normal Spleen: Normal Adrenals/Urinary Tract: Adrenal glands are normal. Kidneys are normal. Bladder is normal. Stomach/Bowel: Previous gastric surgery. I do not see evidence of small-bowel obstruction or large bowel obstruction. The appendix is normal. There is an abnormal appearance of the mesentery worrisome for peritoneal carcinomatosis. There is free  fluid in the pelvis with slight nodularity of the peritoneal surfaces. I cannot be sure the source of this process. I might question the mid transverse colon. One could also consider infectious peritonitis causing this appearance. Vascular/Lymphatic: Aorta and IVC are normal.  No adenopathy. Reproductive: Previous hysterectomy.  No evidence of ovarian mass. Other: No free air.  No hernia. Musculoskeletal: Old superior endplate compression  deformity of T12. IMPRESSION: Abnormal appearance of the mesentery, free intraperitoneal fluid, and slightly thickened appearance of the peritoneal surfaces in the pelvis suggesting peritoneal carcinomatosis. Conceivably, infectious peritonitis could have this appearance as well, but peritoneal carcinomatosis is favored. I do not see a definite site of origin. I wonder about the mid transverse colon, but this is not definite. Previous cholecystectomy.  Previous hysterectomy. Electronically Signed   By: Nelson Chimes M.D.   On: 05/31/2021 22:21   DG Chest Port 1 View  Result Date: 05/31/2021 CLINICAL DATA:  Upper abdominal pain EXAM: PORTABLE CHEST 1 VIEW COMPARISON:  01/09/2021 FINDINGS: The heart size and mediastinal contours are within normal limits. Both lungs are clear. The visualized skeletal structures are unremarkable. IMPRESSION: No active disease. Electronically Signed   By: Fidela Salisbury M.D.   On: 05/31/2021 21:02    Anti-infectives: Anti-infectives (From admission, onward)    Start     Dose/Rate Route Frequency Ordered Stop   06/01/21 0600  piperacillin-tazobactam (ZOSYN) IVPB 3.375 g  Status:  Discontinued        3.375 g 12.5 mL/hr over 240 Minutes Intravenous Every 8 hours 06/01/21 0536 06/01/21 1234   05/31/21 2315  piperacillin-tazobactam (ZOSYN) IVPB 3.375 g        3.375 g 100 mL/hr over 30 Minutes Intravenous  Once 05/31/21 2311 06/01/21 0033        Assessment/Plan Abdominal pain Chronic constipation  Peritonitis vs peritoneal carcinomatosis  - colonoscopy < 2 years ago without concern for malignancy, pt does report chronic constipation although notes this has been worse over the last few  - not clinically obstructed at this time based on abdominal exam - WBC 6 from 12 and afebrile, got Zosyn 2/26>2/27 - IR planning biopsy today, will follow for results of this  - if IR biopsy unrevealing, next step would be diagnostic laparoscopy   FEN: NPO for procedure, IVF VTE:  SCDs ID: Zosyn 2/26>2/27  GERD HTN Osteopenia  Depression Obesity Class II - BMI 35.5   LOS: 2 days   I reviewed Consultant IR notes, hospitalist notes, last 24 h vitals and pain scores, last 48 h intake and output, and last 24 h labs and trends.  This care required moderate level of medical decision making.    Norm Parcel, [facility] 06/02/2021, 9:05 AM Please see Amion for pager number during day hours 7:00am-4:30pm

## 2021-06-02 NOTE — Progress Notes (Signed)
Mobility Specialist Progress Note:   06/02/21 1234  Mobility  Activity Off unit   Will follow-up as time allows.  [facility] Public librarian Phone [phone]

## 2021-06-02 NOTE — Progress Notes (Signed)
Mobility Specialist Progress Note:   06/02/21 1621  Mobility  Bed Position Chair  Activity Ambulated with assistance in hallway  Level of Assistance Standby assist, set-up cues, supervision of patient - no hands on  Assistive Device None  Distance Ambulated (ft) 570 ft  Activity Response Tolerated well  $Mobility charge 1 Mobility   Pt received in bed willing to participate in mobility. No complaints of pain. Pt left in chair with call bell in reach, all needs met and visitor present.   [facility] Public librarian Phone [phone]

## 2021-06-03 LAB — COMPREHENSIVE METABOLIC PANEL
ALT: 11 U/L (ref 0–44)
AST: 18 U/L (ref 15–41)
Albumin: 3.2 g/dL — ABNORMAL LOW (ref 3.5–5.0)
Alkaline Phosphatase: 54 U/L (ref 38–126)
Anion gap: 8 (ref 5–15)
BUN: 11 mg/dL (ref 8–23)
CO2: 28 mmol/L (ref 22–32)
Calcium: 8.4 mg/dL — ABNORMAL LOW (ref 8.9–10.3)
Chloride: 107 mmol/L (ref 98–111)
Creatinine, Ser: 0.73 mg/dL (ref 0.44–1.00)
GFR, Estimated: >60 mL/min (ref >60)
Glucose, Bld: 93 mg/dL (ref 70–99)
Potassium: 3.8 mmol/L (ref 3.5–5.1)
Sodium: 143 mmol/L (ref 135–145)
Total Bilirubin: 0.4 mg/dL (ref 0.3–1.2)
Total Protein: 5.6 g/dL — ABNORMAL LOW (ref 6.5–8.1)

## 2021-06-03 MED ORDER — POLYETHYLENE GLYCOL 3350 17 G PO PACK: 17.0000 g | PACK | Freq: Every day | ORAL | 0 refills | Status: DC

## 2021-06-03 NOTE — Progress Notes (Signed)
3/1 IM Letter mailed to patient's home address due patient being discharged before receiving IM Letter. ?

## 2021-06-03 NOTE — Discharge Summary (Signed)
[name] [name]  [name] [name] YTK:[id number] DOB: [birth year] DOA: 05/31/2021  PCP: Margretta Sidle, MD  Admit date: 05/31/2021 Discharge date: 06/03/2021 30 Day Unplanned Readmission Risk Score    Flowsheet Row ED to Hosp-Admission (Current) from 05/31/2021 in [facility]  30 Day Unplanned Readmission Risk Score (%) 8.99 Filed at 06/03/2021 0801       This score is the patient's risk of an unplanned readmission within 30 days of being discharged (0 -100%). The score is based on dignosis, age, lab data, medications, orders, and past utilization.   Low:  0-14.9   Medium: 15-21.9   High: 22-29.9   Extreme: 30 and above          Admitted From: Home Disposition: Home  Recommendations for Outpatient Follow-up:  Follow up with PCP in 1-2 weeks Please obtain BMP/CBC in one week Follow-up with Dr. Ramirez/general surgery in 1 to 2 weeks Follow-up with oncology/Dr. Alvy Bimler next week. Please follow up with your PCP on the following pending results: Unresulted Labs (From admission, onward)    None         Home Health: None Equipment/Devices: None  Discharge Condition: Stable CODE STATUS: Full code Diet recommendation: Cardiac  Subjective: Seen and examined.  She feels well.  No abdominal pain.  Tolerating diet but does not have good appetite.  Passing flatus but no bowel movement yet.  She is slightly sad about all the situation but she is comfortable going home.  Brief/Interim [name]: [name] [name] is a 71 y.o. female with medical history significant of OA, obesity who presented with abdominal pain for last 2 weeks and fever to 100 F along with decreased appetite and constipation.    Upon arrival to ED, she was hemodynamically stable.  CT of her abdomen showed inflammation of the mesentery and peritoneal surface of her colon with differential diagnosis of infectious cause versus carcinomatosis.  Radiology favors the  carcinomatosis.  She was admitted under hospitalist service.  Primary source was unknown.  Multiple cancer markers were checked and she was found to have elevated CA125 raising suspicion for possible ovarian cancer.  Pelvic ultrasound was done, left kidney ovary was unremarkable but right ovary could not be seen well.  General surgery was consulted.  She underwent CT-guided omental mass biopsy by IR yesterday.  Pathology report is not back yet.  She has been cleared by general surgery.  She is tolerating diet with no abdominal pain.  She is also willing to go home although she has had about the whole situation/diagnosis.  I have spoken to Dr. Chryl Heck who is on-call for oncology and she reassured me that she will make sure that patient is seen by oncologist next week the earliest.  Patient has been set up to see general surgery in 2 weeks as well.  Patient has been informed about all these arrangements.   Depression: Resume amitriptyline.   Morbid obesity: Weight loss counseled.   Leukocytosis: Resolved.  No indication of infection.  She received 1-2 doses of Zosyn.  Discharge plan was discussed with patient and/or family member and they verbalized understanding and agreed with it.  Discharge Diagnoses:  Principal Problem:   Carcinomatosis [facility]) Active Problems:   Abdominal pain   Leukocytosis    Discharge Instructions   Allergies as of 06/03/2021       Reactions   Augmentin [amoxicillin-pot Clavulanate] Diarrhea, Nausea Only   Morphine And Related Other (See Comments)   DIZZINESS HYPOTENSION  HYPOXIA        Medication List     STOP taking these medications    diltiazem 30 MG tablet Commonly known as: CARDIZEM   Ferrocite 324 (106 Fe) MG Tabs tablet Generic drug: Ferrous Fumarate   fluticasone 50 MCG/ACT nasal spray Commonly known as: FLONASE   metoprolol tartrate 100 MG tablet Commonly known as: LOPRESSOR   nystatin powder Commonly known as: MYCOSTATIN/NYSTOP        TAKE these medications    amitriptyline 10 MG tablet Commonly known as: ELAVIL Take 20 mg by mouth daily.   bisacodyl 5 MG EC tablet Commonly known as: DULCOLAX Take 5 mg by mouth once a week.   Calcium 600-200 MG-UNIT tablet Take 1 tablet by mouth daily.   diclofenac Sodium 1 % Gel Commonly known as: VOLTAREN Apply 2 g topically 4 (four) times daily as needed (dry skin, rash).   docusate sodium 100 MG capsule Commonly known as: COLACE Take 100-200 mg by mouth See admin instructions. Take 200 mg in the Morning and 100 mg in the evening   meloxicam 15 MG tablet Commonly known as: MOBIC TAKE 1 TABLET BY MOUTH EVERY DAY What changed:  when to take this reasons to take this   polyethylene glycol 17 g packet Commonly known as: MiraLax Take 17 g by mouth daily.   Systane 0.4-0.3 % Soln Generic drug: Polyethyl Glycol-Propyl Glycol Apply 1 drop to eye daily.   vitamin B-12 500 MCG tablet Commonly known as: CYANOCOBALAMIN Take 500 mcg by mouth daily.   Vitamin D (Cholecalciferol) 25 MCG (1000 UT) Tabs Take 1,000 mcg by mouth daily.        Follow-up Information     Ralene Ok, MD. Go on 06/17/2021.   Specialty: General Surgery Why: 2:10 PM to discuss pathology from IR biopsy and any further surgical planning if needed. Please arrive 30 min prior to appointment time to check in. Bring photo ID and insurance card with you. Contact information: [place] [address] [phone]         Margretta Sidle, MD Follow up in 1 week(s).   Contact information: [place] [address] [phone]         Berniece Salines, DO .   Specialty: Cardiology Contact information: [address] [place] [address] [phone]         Heath Lark, MD. Call in 1 week(s).   Specialty: Hematology and Oncology Contact information: [place] [address] [phone]                 Allergies  Allergen Reactions   Augmentin [Amoxicillin-Pot Clavulanate] Diarrhea and Nausea Only   Morphine And Related Other (See Comments)    DIZZINESS HYPOTENSION HYPOXIA    Consultations: General surgery and IR   Procedures/Studies: CT Abdomen Pelvis W Contrast  Result Date: 05/31/2021 CLINICAL DATA:  Right lower quadrant abdominal pain and fever. Radiating to the back. EXAM: CT ABDOMEN AND PELVIS WITH CONTRAST TECHNIQUE: Multidetector CT imaging of the abdomen and pelvis was performed using the standard protocol following bolus administration of intravenous contrast. RADIATION DOSE REDUCTION: This exam was performed according to the departmental dose-optimization program which includes automated exposure control, adjustment of the mA and/or kV according to patient size and/or use of iterative reconstruction technique. CONTRAST:  1110mL OMNIPAQUE IOHEXOL 350 MG/ML SOLN COMPARISON:  04/01/2018 FINDINGS: Lower chest: Mild scarring in the medial right middle lobe and lingula. Hepatobiliary:  Liver parenchyma is normal. Previous cholecystectomy. Mild biliary ductal prominence, typical following cholecystectomy and similar to the study 2019. Pancreas: Normal Spleen: Normal Adrenals/Urinary Tract: Adrenal glands are normal. Kidneys are normal. Bladder is normal. Stomach/Bowel: Previous gastric surgery. I do not see evidence of small-bowel obstruction or large bowel obstruction. The appendix is normal. There is an abnormal appearance of the mesentery worrisome for peritoneal carcinomatosis. There is free fluid in the pelvis with slight nodularity of the peritoneal surfaces. I cannot be sure the source of this process. I might question the mid transverse colon. One could also consider infectious peritonitis causing this appearance. Vascular/Lymphatic: Aorta and IVC are normal.  No adenopathy. Reproductive: Previous hysterectomy.  No evidence of ovarian mass. Other: No free air.  No hernia.  Musculoskeletal: Old superior endplate compression deformity of T12. IMPRESSION: Abnormal appearance of the mesentery, free intraperitoneal fluid, and slightly thickened appearance of the peritoneal surfaces in the pelvis suggesting peritoneal carcinomatosis. Conceivably, infectious peritonitis could have this appearance as well, but peritoneal carcinomatosis is favored. I do not see a definite site of origin. I wonder about the mid transverse colon, but this is not definite. Previous cholecystectomy.  Previous hysterectomy. Electronically Signed   By: Nelson Chimes M.D.   On: 05/31/2021 22:21   CT BIOPSY  Result Date: 06/02/2021 INDICATION: 71 year old female with nodular omental fat stranding on CT concerning for peritoneal carcinomatosis. EXAM: CT BIOPSY COMPARISON:  05/31/2021 MEDICATIONS: None. ANESTHESIA/SEDATION: Fentanyl 50 mcg IV; Versed 1 mg IV Sedation time: 10 minutes; The patient was continuously monitored during the procedure by the interventional radiology nurse under my direct supervision. CONTRAST:  None. COMPLICATIONS: None immediate. PROCEDURE: Informed consent was obtained from the patient following an explanation of the procedure, risks, benefits and alternatives. A time out was performed prior to the initiation of the procedure. The patient was positioned supine on the CT table and a limited CT was performed for procedural planning demonstrating nodular fat stranding in the lower anterior abdominal omental fat. The procedure was planned. The operative site was prepped and draped in the usual sterile fashion. Appropriate trajectory was confirmed with a 22 gauge spinal needle after the adjacent tissues were anesthetized with 1% Lidocaine with epinephrine. Under intermittent CT guidance, a 17 gauge coaxial needle was advanced into the peripheral aspect of the mass. Appropriate positioning was confirmed and a total of 4 samples were obtained with an 18 gauge core needle biopsy device. The  co-axial needle was removed and hemostasis was achieved with manual compression. A limited postprocedural CT was negative for hemorrhage or additional complication. A dressing was placed. The patient tolerated the procedure well without immediate postprocedural complication. IMPRESSION: Technically successful CT guided core needle biopsy of ventral lower abdominal omental fat. RADIATION DOSE REDUCTION: This exam was performed according to the departmental dose-optimization program which includes automated exposure control, adjustment of the mA and/or kV according to patient size and/or use of iterative reconstruction technique. Ruthann Cancer, MD [facility] Radiology Electronically Signed   By: Ruthann Cancer M.D.   On: 06/02/2021 13:33   DG Chest Port 1 View  Result Date: 05/31/2021 CLINICAL DATA:  Upper abdominal pain EXAM: PORTABLE CHEST 1 VIEW COMPARISON:  01/09/2021 FINDINGS: The heart size and mediastinal contours are within normal limits. Both lungs are clear. The visualized skeletal structures are unremarkable. IMPRESSION: No active disease. Electronically Signed   By: Fidela Salisbury M.D.   On: 05/31/2021 21:02   US PELVIC COMPLETE WITH TRANSVAGINAL  Result  Date: 06/02/2021 CLINICAL DATA:  Pelvic mass, abnormal CT suggesting peritoneal carcinomatosis of unknown origin EXAM: TRANSABDOMINAL AND TRANSVAGINAL ULTRASOUND OF PELVIS TECHNIQUE: Both transabdominal and transvaginal ultrasound examinations of the pelvis were performed. Transabdominal technique was performed for global imaging of the pelvis including uterus, ovaries, adnexal regions, and pelvic cul-de-sac. It was necessary to proceed with endovaginal exam following the transabdominal exam to visualize the ovaries. COMPARISON:  CT abdomen and pelvis 05/31/2021 FINDINGS: Uterus Surgically absent Endometrium Surgically absent Right ovary No definite RIGHT ovary visualized, see below Left ovary  Measurements: 2.9 x 2.2 x 1.8 cm = volume: 6.0 mL. Normal morphology. 10 mm cyst; No followup imaging recommended Note: This recommendation does not apply to premenarchal patients or to those with increased risk (genetic, family history, elevated tumor markers or other high-risk factors) of ovarian cancer. Reference: Radiology 2019 Nov; 293(2):359-371. Other findings Moderate amount of free pelvic fluid, much of a containing scattered internal echogenicity/complexity. Soft tissue mass identified in RIGHT adnexa 2.9 x 1.5 x 1.8 cm, does not demonstrate definite follicles, question peritoneal carcinomatosis, less likely RIGHT ovary. Additional scattered areas of abnormal appearing soft tissue are seen within the fluid suspicious for peritoneal carcinomatosis as well. Scattered bowel loops. IMPRESSION: Complex fluid throughout the pelvis with areas of soft tissue nodularity suspicious for peritoneal carcinomatosis. Post hysterectomy with unremarkable LEFT ovary. Questionable visualization RIGHT ovary versus more likely additional soft tissue nodularity/carcinomatosis changes in the RIGHT pelvis. Electronically Signed   By: Lavonia Dana M.D.   On: 06/02/2021 11:36     Discharge Exam: Vitals:   06/03/21 0455 06/03/21 0749  BP: 122/63 139/89  Pulse: 79 89  Resp: 16 17  Temp: 98 F (36.7 C) 97.7 F (36.5 C)  SpO2: 97% 98%   Vitals:   06/02/21 1402 06/02/21 1937 06/03/21 0455 06/03/21 0749  BP: 133/74 (!) 149/80 122/63 139/89  Pulse: 83 92 79 89  Resp: 18 18 16 17   Temp: 98.3 F (36.8 C) 98.7 F (37.1 C) 98 F (36.7 C) 97.7 F (36.5 C)  TempSrc: Oral Oral  Oral  SpO2: 100% 96% 97% 98%    General: Pt is alert, awake, not in acute distress Cardiovascular: RRR, S1/S2 +, no rubs, no gallops Respiratory: CTA bilaterally, no wheezing, no rhonchi Abdominal: Soft, NT, ND, bowel sounds + Extremities: no edema, no cyanosis    The results of significant diagnostics from this hospitalization  (including imaging, microbiology, ancillary and laboratory) are listed below for reference.     Microbiology: Recent Results (from the past 240 hour(s))  Resp Panel by RT-PCR (Flu A&B, Covid) Nasopharyngeal Swab     Status: None   Collection Time: 05/31/21 10:30 PM   Specimen: Nasopharyngeal Swab; Nasopharyngeal(NP) swabs in vial transport medium  Result Value Ref Range Status   SARS Coronavirus 2 by RT PCR NEGATIVE NEGATIVE Final    Comment: (NOTE) SARS-CoV-2 target nucleic acids are NOT DETECTED.  The SARS-CoV-2 RNA is generally detectable in upper respiratory specimens during the acute phase of infection. The lowest concentration of SARS-CoV-2 viral copies this assay can detect is 138 copies/mL. A negative result does not preclude SARS-Cov-2 infection and should not be used as the sole basis for treatment or other patient management decisions. A negative result may occur with  improper specimen collection/handling, submission of specimen other than nasopharyngeal swab, presence of viral mutation(s) within the areas targeted by this assay, and inadequate number of viral copies(<138 copies/mL). A negative result must be combined with clinical observations, patient history,  and epidemiological information. The expected result is Negative.  Fact Sheet for Patients:  EntrepreneurPulse.com.au  Fact Sheet for Healthcare Providers:  IncredibleEmployment.be  This test is no t yet approved or cleared by the [place] FDA and  has been authorized for detection and/or diagnosis of SARS-CoV-2 by FDA under an Emergency Use Authorization (EUA). This EUA will remain  in effect (meaning this test can be used) for the duration of the COVID-19 declaration under Section 564(b)(1) of the Act, 21 U.S.C.section 360bbb-3(b)(1), unless the authorization is terminated  or revoked sooner.       Influenza A by PCR NEGATIVE NEGATIVE Final   Influenza B by PCR  NEGATIVE NEGATIVE Final    Comment: (NOTE) The Xpert Xpress SARS-CoV-2/FLU/RSV plus assay is intended as an aid in the diagnosis of influenza from Nasopharyngeal swab specimens and should not be used as a sole basis for treatment. Nasal washings and aspirates are unacceptable for Xpert Xpress SARS-CoV-2/FLU/RSV testing.  Fact Sheet for Patients: EntrepreneurPulse.com.au  Fact Sheet for Healthcare Providers: IncredibleEmployment.be  This test is not yet approved or cleared by the [place] FDA and has been authorized for detection and/or diagnosis of SARS-CoV-2 by FDA under an Emergency Use Authorization (EUA). This EUA will remain in effect (meaning this test can be used) for the duration of the COVID-19 declaration under Section 564(b)(1) of the Act, 21 U.S.C. section 360bbb-3(b)(1), unless the authorization is terminated or revoked.  Performed at [facility], [place] [address], [place], [place] [address]      Labs: BNP (last 3 results) No results for input(s): BNP in the last 8760 hours. Basic Metabolic Panel: Recent Labs  Lab 05/31/21 2033 06/01/21 0618 06/03/21 0248  NA 140 141 143  K 4.1 3.6 3.8  CL 101 106 107  CO2 26 25 28   GLUCOSE 94 96 93  BUN 21 11 11   CREATININE 0.71 0.64 0.73  CALCIUM 9.3 8.5* 8.4*   Liver Function Tests: Recent Labs  Lab 05/31/21 2033 06/01/21 0618 06/03/21 0248  AST 25 19 18   ALT 14 13 11   ALKPHOS 77 61 54  BILITOT 0.5 0.6 0.4  PROT 6.8 6.0* 5.6*  ALBUMIN 4.0 3.6 3.2*   Recent Labs  Lab 05/31/21 2033  LIPASE 51   No results for input(s): AMMONIA in the last 168 hours. CBC: Recent Labs  Lab 05/31/21 2033 06/01/21 0618  WBC 12.1* 6.6  NEUTROABS 7.4  --   HGB 13.4 12.4  HCT 41.3 37.4  MCV 96.5 95.2  PLT 440* 397   Cardiac Enzymes: No results for input(s): CKTOTAL, CKMB, CKMBINDEX, TROPONINI in the last 168 hours. BNP: Invalid input(s): POCBNP CBG: No results for  input(s): GLUCAP in the last 168 hours. D-Dimer No results for input(s): DDIMER in the last 72 hours. Hgb A1c No results for input(s): HGBA1C in the last 72 hours. Lipid Profile No results for input(s): CHOL, HDL, LDLCALC, TRIG, CHOLHDL, LDLDIRECT in the last 72 hours. Thyroid function studies No results for input(s): TSH, T4TOTAL, T3FREE, THYROIDAB in the last 72 hours.  Invalid input(s): FREET3 Anemia work up No results for input(s): VITAMINB12, FOLATE, FERRITIN, TIBC, IRON, RETICCTPCT in the last 72 hours. Urinalysis    Component Value Date/Time   COLORURINE YELLOW 05/31/2021 2212   APPEARANCEUR CLEAR 05/31/2021 2212   LABSPEC 1.012 05/31/2021 2212   PHURINE 5.0 05/31/2021 2212   GLUCOSEU NEGATIVE 05/31/2021 2212   HGBUR NEGATIVE 05/31/2021 2212   BILIRUBINUR NEGATIVE 05/31/2021 2212   BILIRUBINUR NEGATIVE  08/16/2017 1006   KETONESUR 80 (A) 05/31/2021 2212   PROTEINUR NEGATIVE 05/31/2021 2212   UROBILINOGEN 0.2 08/16/2017 1006   UROBILINOGEN 0.2 11/20/2015 1501   NITRITE NEGATIVE 05/31/2021 2212   LEUKOCYTESUR SMALL (A) 05/31/2021 2212   Sepsis Labs Invalid input(s): PROCALCITONIN,  WBC,  LACTICIDVEN Microbiology Recent Results (from the past 240 hour(s))  Resp Panel by RT-PCR (Flu A&B, Covid) Nasopharyngeal Swab     Status: None   Collection Time: 05/31/21 10:30 PM   Specimen: Nasopharyngeal Swab; Nasopharyngeal(NP) swabs in vial transport medium  Result Value Ref Range Status   SARS Coronavirus 2 by RT PCR NEGATIVE NEGATIVE Final    Comment: (NOTE) SARS-CoV-2 target nucleic acids are NOT DETECTED.  The SARS-CoV-2 RNA is generally detectable in upper respiratory specimens during the acute phase of infection. The lowest concentration of SARS-CoV-2 viral copies this assay can detect is 138 copies/mL. A negative result does not preclude SARS-Cov-2 infection and should not be used as the sole basis for treatment or other patient management decisions. A negative result  may occur with  improper specimen collection/handling, submission of specimen other than nasopharyngeal swab, presence of viral mutation(s) within the areas targeted by this assay, and inadequate number of viral copies(<138 copies/mL). A negative result must be combined with clinical observations, patient history, and epidemiological information. The expected result is Negative.  Fact Sheet for Patients:  EntrepreneurPulse.com.au  Fact Sheet for Healthcare Providers:  IncredibleEmployment.be  This test is no t yet approved or cleared by the [place] FDA and  has been authorized for detection and/or diagnosis of SARS-CoV-2 by FDA under an Emergency Use Authorization (EUA). This EUA will remain  in effect (meaning this test can be used) for the duration of the COVID-19 declaration under Section 564(b)(1) of the Act, 21 U.S.C.section 360bbb-3(b)(1), unless the authorization is terminated  or revoked sooner.       Influenza A by PCR NEGATIVE NEGATIVE Final   Influenza B by PCR NEGATIVE NEGATIVE Final    Comment: (NOTE) The Xpert Xpress SARS-CoV-2/FLU/RSV plus assay is intended as an aid in the diagnosis of influenza from Nasopharyngeal swab specimens and should not be used as a sole basis for treatment. Nasal washings and aspirates are unacceptable for Xpert Xpress SARS-CoV-2/FLU/RSV testing.  Fact Sheet for Patients: EntrepreneurPulse.com.au  Fact Sheet for Healthcare Providers: IncredibleEmployment.be  This test is not yet approved or cleared by the [place] FDA and has been authorized for detection and/or diagnosis of SARS-CoV-2 by FDA under an Emergency Use Authorization (EUA). This EUA will remain in effect (meaning this test can be used) for the duration of the COVID-19 declaration under Section 564(b)(1) of the Act, 21 U.S.C. section 360bbb-3(b)(1), unless the authorization is terminated  or revoked.  Performed at [facility], [place] [address] [place], [place] [address]      Time coordinating discharge: Over 30 minutes  SIGNED:   Darliss Cheney, MD  [facility] 06/03/2021, 9:33 AM  If 7PM-7AM, please contact night-coverage [email or web address]

## 2021-06-03 NOTE — Care Management Important Message (Signed)
Important Message ? ?Patient Details  ?Name: [name] [name] ?MRN: [id number] ?Date of Birth: [birth year] ? ? ?Medicare Important Message Given:  Other (see comment) ? ? ? ? ?Levada Dy  Jonathen Rathman-Martin ?06/03/2021, 2:19 PM ?

## 2021-06-04 ENCOUNTER — Other Ambulatory Visit: Payer: Self-pay | Admitting: Family Medicine

## 2021-06-04 DIAGNOSIS — Z1231 Encounter for screening mammogram for malignant neoplasm of breast: Secondary | ICD-10-CM

## 2021-06-04 LAB — SURGICAL PATHOLOGY

## 2021-06-08 ENCOUNTER — Other Ambulatory Visit: Payer: Self-pay

## 2021-06-08 ENCOUNTER — Ambulatory Visit
Admission: RE | Admit: 2021-06-08 | Discharge: 2021-06-08 | Disposition: A | Discharge status: Home / Self Care | Payer: Medicare PPO | Source: Ambulatory Visit | Attending: Family Medicine | Admitting: Family Medicine

## 2021-06-08 DIAGNOSIS — Z1231 Encounter for screening mammogram for malignant neoplasm of breast: Secondary | ICD-10-CM

## 2021-07-20 ENCOUNTER — Encounter: Payer: Self-pay | Admitting: Cardiology

## 2021-07-20 NOTE — Telephone Encounter (Signed)
Spoke to patient she has appointment already scheduled with Dr.Tobb 4/20 at 8:40 am.Stated she wanted to make Dr.Tobb aware of her symptoms.Advised I will send message to her. ?

## 2021-07-23 ENCOUNTER — Ambulatory Visit: Payer: Medicare PPO | Admitting: Cardiology

## 2021-07-23 ENCOUNTER — Encounter: Payer: Self-pay | Admitting: Cardiology

## 2021-07-23 VITALS — BP 142/86 | HR 96 | Ht <= 58 in | Wt 171.6 lb

## 2021-07-23 DIAGNOSIS — R002 Palpitations: Secondary | ICD-10-CM

## 2021-07-23 DIAGNOSIS — E669 Obesity, unspecified: Secondary | ICD-10-CM

## 2021-07-23 DIAGNOSIS — R5383 Other fatigue: Secondary | ICD-10-CM

## 2021-07-23 DIAGNOSIS — R030 Elevated blood-pressure reading, without diagnosis of hypertension: Secondary | ICD-10-CM

## 2021-07-23 NOTE — Progress Notes (Signed)
?Cardiology Office Note:   ? ?Date:  07/24/2021  ? ?ID:  [name] [name], DOB Jul 27, 1950, MRN [id number] ? ?PCP:  Margretta Sidle, MD  ?Cardiologist:  Berniece Salines, DO  ?Electrophysiologist:  None  ? ?Referring MD: Margretta Sidle, MD  ? ?:" I am experiencing palpitations" ? ?History of Present Illness:   ? ?[name] [name] is a 71 y.o. female with a hx of coronary spasm which was diagnosed back in 1999 or 2007 from a heart catheterization, hyperlipidemia, obesity and ovarian cancer on chemo planning surgery here today for follow-up visit. ? ?First saw the patient on February 17, 2021 at that time she experienced intermittent palpitations along with chest pain.  We will send the patient for coronary CT scan, she had previously worn a monitor prior to her visit and during the time of her visit I reviewed a monitor that was normal. ? ?Her coronary CTA came back normal. ?Today she tells me she still is experiencing intermittent palpitations ? ? ?Past Medical History:  ?Diagnosis Date  ? Allergy   ? Anemia   ? IN THE PAST WITH PREGNANCY  ? Arthritis   ? LEFT FOOT; OCCAS LOWER BACK PAIN - HX OF VERTEBRAL FRACTURES  ? Blood transfusion without reported diagnosis   ? DURING PREGNANCY  ? Cataract   ? removed both eyes   ? Complication of anesthesia   ? a little sluggist when waking up-no intervention required  ? Constipation   ? uses Colace 3 x a day and Dulcolax q week- with this has soft BM's- no pain   ? Coronary artery spasm ([place]) 1990'S  ? AFTER OTC DIET PILLS - PT EXPERIENCED CHEST PAIN- PT HAD CARDIAC CATH - TOLD NO CAD - THOUGHT TO HAVE HAS CORNARY ARTERY SPASM  ? GERD (gastroesophageal reflux disease)   ? mild   ? H/O seasonal allergies   ? Headache(784.0)   ? OCCAS SINUS HEADACHES  ? Hearing impaired   ? LEFT EAR DEAFNESS  ? Hypertension   ? 220/104 IN SPRING 2015 - SAW HER DOCTOR - BUT B/P NORMALIZED - PT DECIDED TO STOP CAFFEINE AND HAS NOT HAD ANY ELEVATED PRESSURES SINCE JUNE  ? Osteopenia   ? Strabismus    ? HAD STRABISMUS SURGERY 1956 - BUT STILL HAS STRABISMUS  ? Vitamin D deficiency   ? ? ?Past Surgical History:  ?Procedure Laterality Date  ? ABDOMINAL HYSTERECTOMY    ? BONE ANCHORED HEARING AID IMPLANT Left 01/26/2019  ? Procedure: BONE ANCHORED HEARING AID (BAHA) IMPLANT;  Surgeon: [name], MD;  Location: [place];  Service: ENT;  Laterality: Left;  ? BREATH TEK H PYLORI N/A 10/08/2013  ? Procedure: BREATH TEK H PYLORI;  Surgeon: Gayland Curry, MD;  Location: [facility];  Service: General;  Laterality: N/A;  ? BREATH TEK H PYLORI N/A 12/03/2013  ? Procedure: BREATH TEK H PYLORI;  Surgeon: Gayland Curry, MD;  Location: [facility];  Service: General;  Laterality: N/A;  ? CHOLECYSTECTOMY N/A 04/13/2016  ? Procedure: LAPAROSCOPIC CHOLECYSTECTOMY WITH ATTEMPTED INTRAOPERATIVE CHOLANGIOGRAM;  Surgeon: Greer Pickerel, MD;  Location: [place];  Service: General;  Laterality: N/A;  ? COLONOSCOPY    ? EYE SURGERY Bilateral 2017  ? cataract surgery with lens implant  ? [place]  ? RADICAL MASTOIDECTOMY  ? LAPAROSCOPIC GASTRIC SLEEVE RESECTION N/A 12/18/2013  ? Procedure: LAPAROSCOPIC GASTRIC SLEEVE RESECTION;  Surgeon: Greer Pickerel, MD;  Location: [facility];  Service: General;  Laterality: N/A;  ?  REPAIR TENDONS FOOT Right 1994  ? AND RECONSTRUCTIVE SURGERY FOR MULTIPLE FRACTURES OF THE RIGHT FOOT  ? STOMACH SURGERY    ? strabismus repair    ? TONSILLECTOMY    ? UPPER GASTROINTESTINAL ENDOSCOPY    ? ? ?Current Medications: ?Current Meds  ?Medication Sig  ? bisacodyl (DULCOLAX) 5 MG EC tablet Take 5 mg by mouth once a week.   ? Calcium 600-200 MG-UNIT tablet Take 1 tablet by mouth daily.  ? CARBOPLATIN IV Inject into the vein every 21 ( twenty-one) days.  ? diclofenac Sodium (VOLTAREN) 1 % GEL Apply 2 g topically 4 (four) times daily as needed (dry skin, rash).  ? docusate sodium (COLACE) 100 MG capsule Take 100-200 mg by mouth See admin instructions. Take 200 mg in the Morning and 100 mg in the evening  ?  Famotidine (PEPCID PO) Take by mouth daily.  ? meloxicam (MOBIC) 15 MG tablet TAKE 1 TABLET BY MOUTH EVERY DAY (Patient taking differently: Take 15 mg by mouth daily as needed for pain.)  ? ondansetron (ZOFRAN) 8 MG tablet Take by mouth.  ? PACLitaxel (TAXOL IV) Inject into the vein every 21 ( twenty-one) days.  ? Polyethyl Glycol-Propyl Glycol (SYSTANE) 0.4-0.3 % SOLN Apply 1 drop to eye daily.  ? prochlorperazine (COMPAZINE) 5 MG tablet Take by mouth.  ? traMADol (ULTRAM) 50 MG tablet Take by mouth as needed.  ? vitamin B-12 (CYANOCOBALAMIN) 500 MCG tablet Take 500 mcg by mouth daily.  ? Vitamin D, Cholecalciferol, 1000 units TABS Take 1,000 mcg by mouth daily.   ?  ? ?Allergies:   Augmentin [amoxicillin-pot clavulanate] and Morphine and related  ? ?Social History  ? ?Socioeconomic History  ? Marital status: Married  ?  Spouse name: Marden Noble  ? Number of children: 2  ? Years of education: master's degree  ? Highest education level: Not on file  ?Occupational History  ? Occupation: Therapist, sports  ?  Employer: [employer]  ?Tobacco Use  ? Smoking status: Never  ? Smokeless tobacco: Never  ?Vaping Use  ? Vaping Use: Never used  ?Substance and Sexual Activity  ? Alcohol use: No  ?  Alcohol/week: 0.0 standard drinks  ? Drug use: No  ? Sexual activity: Yes  ?  Birth control/protection: Surgical  ?Other Topics Concern  ? Not on file  ?Social History Narrative  ? 05/28/19  ? From: the area x 25 years, originally from [place]  ? Living: with husband Marden Noble, and youngest son  ? 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   ? Family: Quita Skye (married, 2 children) and Shanon Brow (living at home) - adult  ?   ? Enjoys: read, cook, travel, working on projects - organizing, painting, spending time with grandkids  ?   ? Exercise: getting back to exercise, trying to treadmill   ? Diet: meals are healthy, but getting carb cravings  ?   ?  Safety  ? Seat belts: Yes   ? Guns: No  ? Safe in relationships: Yes   ? ?Social Determinants of Health  ? ?Financial Resource Strain: Not on file  ?Food Insecurity: Not on file  ?Transportation Needs: Not on file  ?Physical Activity: Not on file  ?Stress: Not on file  ?Social Connections: Not on file  ?  ? ?Family History: ?The patient's family history includes Alcohol abuse in her father;  COPD in her maternal grandmother and paternal grandfather; Colon cancer (age of onset: 64) in her brother; Colon polyps in her sister; Diabetes in her mother; Heart disease in her mother; Hyperlipidemia in her maternal grandmother; Hypertension in her mother; Obesity in her mother. There is no history of Stomach cancer, Rectal cancer, Esophageal cancer, Pancreatic cancer, or Breast cancer. ? ?ROS:   ?Review of Systems  ?Constitution: Negative for decreased appetite, fever and weight gain.  ?HENT: Negative for congestion, ear discharge, hoarse voice and sore throat.   ?Eyes: Negative for discharge, redness, vision loss in right eye and visual halos.  ?Cardiovascular: Negative for chest pain, dyspnea on exertion, leg swelling, orthopnea and palpitations.  ?Respiratory: Negative for cough, hemoptysis, shortness of breath and snoring.   ?Endocrine: Negative for heat intolerance and polyphagia.  ?Hematologic/Lymphatic: Negative for bleeding problem. Does not bruise/bleed easily.  ?Skin: Negative for flushing, nail changes, rash and suspicious lesions.  ?Musculoskeletal: Negative for arthritis, joint pain, muscle cramps, myalgias, neck pain and stiffness.  ?Gastrointestinal: Negative for abdominal pain, bowel incontinence, diarrhea and excessive appetite.  ?Genitourinary: Negative for decreased libido, genital sores and incomplete emptying.  ?Neurological: Negative for brief paralysis, focal weakness, headaches and loss of balance.  ?Psychiatric/Behavioral: Negative for altered mental status, depression and suicidal ideas.   ?Allergic/Immunologic: Negative for HIV exposure and persistent infections.  ? ? ?EKGs/Labs/Other Studies Reviewed:   ? ?The following studies were reviewed today: ? ? ?EKG: None today ? ?Coronary CT scan March 03, 2021 ?I

## 2021-07-23 NOTE — Patient Instructions (Signed)
Medication Instructions:  ?Your physician recommends that you continue on your current medications as directed. Please refer to the Current Medication list given to you today.  ?*If you need a refill on your cardiac medications before your next appointment, please call your pharmacy* ? ? ?Lab Work: ?None ?If you have labs (blood work) drawn today and your tests are completely normal, you will receive your results only by: ?MyChart Message (if you have MyChart) OR ?A paper copy in the mail ?If you have any lab test that is abnormal or we need to change your treatment, we will call you to review the results. ? ? ?Testing/Procedures: ?Your physician has requested that you have an echocardiogram. Echocardiography is a painless test that uses sound waves to create images of your heart. It provides your doctor with information about the size and shape of your heart and how well your heart?s chambers and valves are working. This procedure takes approximately one hour. There are no restrictions for this procedure.' ? ? ? ?Follow-Up: ?At [facility], you and your health needs are our priority.  As part of our continuing mission to provide you with exceptional heart care, we have created designated Provider Care Teams.  These Care Teams include your primary Cardiologist (physician) and Advanced Practice Providers (APPs -  Physician Assistants and Nurse Practitioners) who all work together to provide you with the care you need, when you need it. ? ?We recommend signing up for the patient portal called "MyChart".  Sign up information is provided on this After Visit Summary.  MyChart is used to connect with patients for Virtual Visits (Telemedicine).  Patients are able to view lab/test results, encounter notes, upcoming appointments, etc.  Non-urgent messages can be sent to your provider as well.   ?To learn more about what you can do with MyChart, go to [email or web address].   ? ?Your next appointment:   ?6  month(s) ? ?The format for your next appointment:   ?In Person ? ?Provider:   ?Berniece Salines, DO   ? ? ?Other Instructions ? ? ?Important Information About Sugar ? ? ? ? ?  ?

## 2021-07-24 DIAGNOSIS — R030 Elevated blood-pressure reading, without diagnosis of hypertension: Secondary | ICD-10-CM | POA: Insufficient documentation

## 2021-07-24 DIAGNOSIS — R5383 Other fatigue: Secondary | ICD-10-CM | POA: Insufficient documentation

## 2021-07-27 ENCOUNTER — Encounter (HOSPITAL_COMMUNITY): Payer: Self-pay

## 2021-08-11 ENCOUNTER — Ambulatory Visit (HOSPITAL_COMMUNITY): Payer: Medicare PPO | Attending: Cardiology

## 2021-08-11 DIAGNOSIS — I5030 Unspecified diastolic (congestive) heart failure: Secondary | ICD-10-CM

## 2021-08-11 DIAGNOSIS — I10 Essential (primary) hypertension: Secondary | ICD-10-CM | POA: Insufficient documentation

## 2021-08-11 DIAGNOSIS — R5383 Other fatigue: Secondary | ICD-10-CM | POA: Insufficient documentation

## 2021-08-11 LAB — ECHOCARDIOGRAM COMPLETE
Area-P 1/2: 4.21 cm2
S' Lateral: 2.5 cm

## 2022-01-11 ENCOUNTER — Encounter: Payer: Self-pay | Admitting: Internal Medicine

## 2022-01-13 ENCOUNTER — Ambulatory Visit
Admission: RE | Admit: 2022-01-13 | Discharge: 2022-01-13 | Disposition: A | Discharge status: Home / Self Care | Payer: Medicare PPO | Source: Ambulatory Visit | Attending: Family Medicine | Admitting: Family Medicine

## 2022-01-13 ENCOUNTER — Other Ambulatory Visit: Payer: Self-pay | Admitting: Family Medicine

## 2022-01-13 DIAGNOSIS — R0989 Other specified symptoms and signs involving the circulatory and respiratory systems: Secondary | ICD-10-CM

## 2022-01-22 ENCOUNTER — Encounter: Payer: Self-pay | Admitting: Internal Medicine

## 2022-01-22 ENCOUNTER — Ambulatory Visit: Payer: Medicare PPO | Admitting: Internal Medicine

## 2022-01-22 VITALS — BP 130/70 | HR 108 | Ht <= 58 in | Wt 172.6 lb

## 2022-01-22 DIAGNOSIS — C800 Disseminated malignant neoplasm, unspecified: Secondary | ICD-10-CM

## 2022-01-22 DIAGNOSIS — K5909 Other constipation: Secondary | ICD-10-CM

## 2022-01-22 NOTE — Progress Notes (Signed)
[name] [name] 71 y.o. October 18, 1950 [id number]  Assessment & Plan:   Encounter Diagnoses  Name Primary?   Chronic constipation Yes   Carcinomatosis ([place])    Chronic constipation exacerbated in the setting of carcinomatosis from ovarian cancer. MOM qd 1 to 2 tablespoons Glycerin suppos vs Fleet enema q 2-3 d to try to move bowels every 2 to 3 days.  Reviewed constipation  side effects ondansetron and oxycodone - she is not using frequently  Follow-up as needed  Subjective:   Chief Complaint: Constipation  HPI 71 year old white woman, retired Marine scientist with chronic constipation and a family history of colon cancer and personal history of colon polyps (colonoscopy 2021 negative) who was just diagnosed with recurrent ovarian cancer with carcinomatosis.  She has been hospitalized twice in the last few weeks with SBO problems.  She is now on a full liquid diet.  She had nausea and vomiting prior to the first admission but the second admission was recommended by her GYN oncology physician at [facility] due to x-ray changes.  Recent CT scan chest abdomen pelvis 01/18/2022 with carcinomatosis in the abdomen particularly around the sigmoid colon.  She is passing flatus but still struggling to move her bowels.  MiraLAX does not not work for her and actually "gums me up".  She has been using a Fleet enema occasionally with relief.  She does not feel impacted.  She had a small bowel series on 01/10/2022 presumably with water-soluble contrast because that "cleaned me out".  She was diagnosed and treated with ex lap salpingo-oophorectomy in May and has had a rapid recurrence of her ovarian cancer.  Currently waiting on genetic testing on the tumor to determine next treatment with chemotherapy most likely.  She has abdominal pain in the lower abdomen she is occasionally using oxycodone and ondansetron but not much. Allergies  Allergen Reactions   Augmentin [Amoxicillin-Pot Clavulanate] Diarrhea and Nausea  Only   Morphine And Related Other (See Comments)    DIZZINESS HYPOTENSION HYPOXIA   Current Meds  Medication Sig   amitriptyline (ELAVIL) 10 MG tablet Take 20 mg by mouth daily.   bisacodyl (DULCOLAX) 5 MG EC tablet Take 5 mg by mouth once a week.    Calcium 600-200 MG-UNIT tablet Take 1 tablet by mouth daily.   diclofenac Sodium (VOLTAREN) 1 % GEL Apply 2 g topically 4 (four) times daily as needed (dry skin, rash).   diltiazem (TIAZAC) 120 MG 24 hr capsule Take 120 mg by mouth daily.   docusate sodium (COLACE) 100 MG capsule Take 100-200 mg by mouth See admin instructions. Take 200 mg in the Morning and 100 mg in the evening   Famotidine (PEPCID PO) Take by mouth daily.   famotidine (PEPCID) 10 MG tablet Take 10 mg by mouth daily.   fluticasone (FLONASE) 50 MCG/ACT nasal spray 50 sprays as needed.   gabapentin (NEURONTIN) 300 MG capsule Take 300 mg by mouth 2 (two) times daily.   melatonin 3 MG TABS tablet Take 3 mg by mouth 1 day or 1 dose.   meloxicam (MOBIC) 15 MG tablet TAKE 1 TABLET BY MOUTH EVERY DAY (Patient taking differently: Take 15 mg by mouth daily as needed for pain.)   meloxicam (MOBIC) 15 MG tablet Take 15 mg by mouth as needed.   ondansetron (ZOFRAN) 8 MG tablet Take by mouth.   oxyCODONE (OXY IR/ROXICODONE) 5 MG immediate release tablet Take 5 mg by mouth every 6 (six) hours as needed.   Polyethyl Glycol-Propyl Glycol (  SYSTANE) 0.4-0.3 % SOLN Apply 1 drop to eye daily.   prochlorperazine (COMPAZINE) 5 MG tablet Take by mouth.   vitamin B-12 (CYANOCOBALAMIN) 500 MCG tablet Take 500 mcg by mouth daily.   Vitamin D, Cholecalciferol, 1000 units TABS Take 1,000 mcg by mouth daily.    Past Medical History:  Diagnosis Date   Allergy    Anemia    IN THE PAST WITH PREGNANCY   Arthritis    LEFT FOOT; OCCAS LOWER BACK PAIN - HX OF VERTEBRAL FRACTURES   Blood transfusion without reported diagnosis    DURING PREGNANCY   Cataract    removed both eyes    Complication of  anesthesia    a little sluggist when waking up-no intervention required   Constipation    uses Colace 3 x a day and Dulcolax q week- with this has soft BM's- no pain    Coronary artery spasm ([facility]) 1990'S   AFTER OTC DIET PILLS - PT EXPERIENCED CHEST PAIN- PT HAD CARDIAC CATH - TOLD NO CAD - THOUGHT TO HAVE HAS CORNARY ARTERY SPASM   GERD (gastroesophageal reflux disease)    mild    H/O seasonal allergies    Headache(784.0)    OCCAS SINUS HEADACHES   Hearing impaired    LEFT EAR DEAFNESS   Hypertension    220/104 IN SPRING 2015 - SAW HER DOCTOR - BUT B/P NORMALIZED - PT DECIDED TO STOP CAFFEINE AND HAS NOT HAD ANY ELEVATED PRESSURES SINCE JUNE   Osteopenia    Strabismus    HAD STRABISMUS SURGERY 1956 - BUT STILL HAS STRABISMUS   Vitamin D deficiency    Past Surgical History:  Procedure Laterality Date   ABDOMINAL HYSTERECTOMY     BONE ANCHORED HEARING AID IMPLANT Left 01/26/2019   Procedure: BONE ANCHORED HEARING AID (BAHA) IMPLANT;  Surgeon: [name], MD;  Location: [place];  Service: ENT;  Laterality: Left;   BREATH TEK H PYLORI N/A 10/08/2013   Procedure: BREATH TEK H PYLORI;  Surgeon: Gayland Curry, MD;  Location: [facility];  Service: General;  Laterality: N/A;   BREATH TEK H PYLORI N/A 12/03/2013   Procedure: Lauris Chroman;  Surgeon: Gayland Curry, MD;  Location: [facility];  Service: General;  Laterality: N/A;   CHOLECYSTECTOMY N/A 04/13/2016   Procedure: LAPAROSCOPIC CHOLECYSTECTOMY WITH ATTEMPTED INTRAOPERATIVE CHOLANGIOGRAM;  Surgeon: Greer Pickerel, MD;  Location: [place];  Service: General;  Laterality: N/A;   COLONOSCOPY     EYE SURGERY Bilateral 2017   cataract surgery with lens implant   [place] GASTRIC SLEEVE RESECTION N/A 12/18/2013   Procedure: LAPAROSCOPIC GASTRIC SLEEVE RESECTION;  Surgeon: Greer Pickerel, MD;  Location: [facility];  Service: General;  Laterality: N/A;   REPAIR TENDONS FOOT Right 1994   AND  RECONSTRUCTIVE SURGERY FOR MULTIPLE FRACTURES OF THE RIGHT FOOT   STOMACH SURGERY     strabismus repair     TONSILLECTOMY     UPPER GASTROINTESTINAL ENDOSCOPY     Social History   Social History Narrative   05/28/19   From: the area x 25 years, originally from [place]: with husband Marden Noble, and youngest son   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      Family: Adam (married, 2 children) and Shanon Brow (living at home) -  adult      Enjoys: read, cook, travel, working on Electrical engineer, Financial planner, spending time with grandkids      Exercise: getting back to exercise, trying to treadmill    Diet: meals are healthy, but getting carb cravings      Safety   Seat belts: Yes    Guns: No   Safe in relationships: Yes    family history includes Alcohol abuse in her father; COPD in her maternal grandmother and paternal grandfather; Colon cancer (age of onset: 60) in her brother; Colon polyps in her sister; Diabetes in her mother; Heart disease in her mother; Hyperlipidemia in her maternal grandmother; Hypertension in her mother; Obesity in her mother.   Review of Systems As above  Objective:   Physical Exam BP 130/70 (BP Location: Left Arm, Patient Position: Sitting, Cuff Size: Normal)   Pulse (!) 108   Ht '4\' 10"'$  (1.473 m)   Wt 172 lb 9.6 oz (78.3 kg)   SpO2 95%   BMI 36.07 kg/m  Abd soft, obese and mild-mod tender LQ's  Data reviewed I reviewed hospitalization records and imaging and labs from October and May of this year with respect to her gynecologic cancer (ovarian cancer).

## 2022-01-22 NOTE — Patient Instructions (Signed)
Take one to two tablespoons daily of Milk Of Magnesia and then every two to three days use a fleets enema or glycerin suppository.  Due to recent changes in healthcare laws, you may see the results of your imaging and laboratory studies on MyChart before your provider has had a chance to review them.  We understand that in some cases there may be results that are confusing or concerning to you. Not all laboratory results come back in the same time frame and the provider may be waiting for multiple results in order to interpret others.  Please give [place] 48 hours in order for your provider to thoroughly review all the results before contacting the office for clarification of your results.    I appreciate the opportunity to care for you. Silvano Rusk, MD, [facility]

## 2022-03-11 ENCOUNTER — Ambulatory Visit: Payer: Medicare PPO | Admitting: Internal Medicine
